# Patient Record
Sex: Female | Born: 1950 | Race: White | Hispanic: No | Marital: Married | Smoking: Never smoker
Health system: Southern US, Community
[De-identification: ages and names within clinical notes are randomized; demographics above are authoritative.]

## PROBLEM LIST (undated history)

## (undated) ENCOUNTER — Ambulatory Visit: Discharge status: Absent without leave | Payer: Medicare HMO

## (undated) DIAGNOSIS — J189 Pneumonia, unspecified organism: Secondary | ICD-10-CM

## (undated) DIAGNOSIS — I499 Cardiac arrhythmia, unspecified: Secondary | ICD-10-CM

## (undated) DIAGNOSIS — E785 Hyperlipidemia, unspecified: Secondary | ICD-10-CM

## (undated) DIAGNOSIS — K219 Gastro-esophageal reflux disease without esophagitis: Secondary | ICD-10-CM

## (undated) DIAGNOSIS — F329 Major depressive disorder, single episode, unspecified: Secondary | ICD-10-CM

## (undated) DIAGNOSIS — I10 Essential (primary) hypertension: Secondary | ICD-10-CM

## (undated) DIAGNOSIS — J45909 Unspecified asthma, uncomplicated: Secondary | ICD-10-CM

## (undated) DIAGNOSIS — IMO0002 Reserved for concepts with insufficient information to code with codable children: Secondary | ICD-10-CM

## (undated) DIAGNOSIS — I4891 Unspecified atrial fibrillation: Secondary | ICD-10-CM

## (undated) DIAGNOSIS — R0602 Shortness of breath: Secondary | ICD-10-CM

## (undated) DIAGNOSIS — G4730 Sleep apnea, unspecified: Secondary | ICD-10-CM

## (undated) DIAGNOSIS — J449 Chronic obstructive pulmonary disease, unspecified: Secondary | ICD-10-CM

## (undated) DIAGNOSIS — M1990 Unspecified osteoarthritis, unspecified site: Secondary | ICD-10-CM

## (undated) DIAGNOSIS — F32A Depression, unspecified: Secondary | ICD-10-CM

## (undated) HISTORY — DX: Hyperlipidemia, unspecified: E78.5

## (undated) HISTORY — PX: ECTOPIC PREGNANCY SURGERY: SHX613

## (undated) HISTORY — PX: APPENDECTOMY: SHX54

## (undated) HISTORY — PX: HERNIA REPAIR: SHX51

## (undated) HISTORY — DX: Unspecified osteoarthritis, unspecified site: M19.90

## (undated) HISTORY — DX: Unspecified atrial fibrillation: I48.91

## (undated) HISTORY — DX: Reserved for concepts with insufficient information to code with codable children: IMO0002

## (undated) HISTORY — DX: Essential (primary) hypertension: I10

---

## 2007-03-29 ENCOUNTER — Other Ambulatory Visit: Admission: RE | Admit: 2007-03-29 | Discharge: 2007-03-29 | Payer: Self-pay | Admitting: Internal Medicine

## 2007-06-13 ENCOUNTER — Ambulatory Visit (HOSPITAL_COMMUNITY): Admission: RE | Admit: 2007-06-13 | Discharge: 2007-06-13 | Payer: Self-pay | Admitting: Internal Medicine

## 2007-06-15 ENCOUNTER — Ambulatory Visit (HOSPITAL_COMMUNITY): Admission: RE | Admit: 2007-06-15 | Discharge: 2007-06-15 | Payer: Self-pay | Admitting: Internal Medicine

## 2007-07-01 ENCOUNTER — Emergency Department (HOSPITAL_COMMUNITY): Admission: EM | Admit: 2007-07-01 | Discharge: 2007-07-01 | Payer: Self-pay | Admitting: Emergency Medicine

## 2008-02-01 ENCOUNTER — Ambulatory Visit (HOSPITAL_COMMUNITY): Admission: RE | Admit: 2008-02-01 | Discharge: 2008-02-01 | Payer: Self-pay | Admitting: Internal Medicine

## 2008-03-14 HISTORY — PX: KNEE ARTHROSCOPY: SUR90

## 2008-09-03 DIAGNOSIS — E785 Hyperlipidemia, unspecified: Secondary | ICD-10-CM | POA: Insufficient documentation

## 2008-09-03 DIAGNOSIS — M9979 Connective tissue and disc stenosis of intervertebral foramina of abdomen and other regions: Secondary | ICD-10-CM | POA: Insufficient documentation

## 2008-09-03 DIAGNOSIS — IMO0002 Reserved for concepts with insufficient information to code with codable children: Secondary | ICD-10-CM | POA: Insufficient documentation

## 2008-09-03 DIAGNOSIS — I10 Essential (primary) hypertension: Secondary | ICD-10-CM | POA: Insufficient documentation

## 2008-10-31 ENCOUNTER — Ambulatory Visit (HOSPITAL_COMMUNITY): Admission: RE | Admit: 2008-10-31 | Discharge: 2008-10-31 | Payer: Self-pay | Admitting: Internal Medicine

## 2010-03-14 HISTORY — PX: KNEE ARTHROSCOPY: SUR90

## 2010-08-23 ENCOUNTER — Ambulatory Visit (HOSPITAL_BASED_OUTPATIENT_CLINIC_OR_DEPARTMENT_OTHER)
Admission: RE | Admit: 2010-08-23 | Discharge: 2010-08-23 | Disposition: A | Discharge status: Home / Self Care | Payer: Worker's Compensation | Source: Ambulatory Visit | Attending: Orthopedic Surgery | Admitting: Orthopedic Surgery

## 2010-08-23 DIAGNOSIS — M1710 Unilateral primary osteoarthritis, unspecified knee: Secondary | ICD-10-CM | POA: Insufficient documentation

## 2010-08-23 DIAGNOSIS — Z01810 Encounter for preprocedural cardiovascular examination: Secondary | ICD-10-CM | POA: Insufficient documentation

## 2010-08-23 DIAGNOSIS — M23302 Other meniscus derangements, unspecified lateral meniscus, unspecified knee: Secondary | ICD-10-CM | POA: Insufficient documentation

## 2010-08-23 DIAGNOSIS — M23329 Other meniscus derangements, posterior horn of medial meniscus, unspecified knee: Secondary | ICD-10-CM | POA: Insufficient documentation

## 2010-08-23 DIAGNOSIS — Z01812 Encounter for preprocedural laboratory examination: Secondary | ICD-10-CM | POA: Insufficient documentation

## 2010-08-23 LAB — POCT I-STAT 4, (NA,K, GLUC, HGB,HCT)
Glucose, Bld: 87 mg/dL (ref 70–99)
HCT: 44 % (ref 36.0–46.0)
Hemoglobin: 15 g/dL (ref 12.0–15.0)
Potassium: 4 mEq/L (ref 3.5–5.1)
Sodium: 141 mEq/L (ref 135–145)

## 2010-08-25 NOTE — Op Note (Signed)
  Kathryn Olsen, Kathryn Olsen       ACCOUNT NO.:  [id number]  MEDICAL RECORD NO.:  [id number]  LOCATION:                                 FACILITY:  PHYSICIAN:  Marlowe Kays, M.D.       DATE OF BIRTH:  DATE OF PROCEDURE:  08/23/2010 DATE OF DISCHARGE:                              OPERATIVE REPORT   PREOPERATIVE DIAGNOSES: 1. Torn medial meniscus. 2. Osteoarthritis of left knee.  POSTOPERATIVE DIAGNOSES: 1. Torn medial and lateral menisci. 2. Osteoarthritis of left knee.  OPERATION:  Left knee arthroscopy with: 1. Partial medial lateral meniscectomy. 2. Shaving of medial and lateral femoral condyle.  SURGEON:  Marlowe Kays, M.D.  ASSISTANT:  Nurse.  ANESTHESIA:  General.  INDICATION FOR PROCEDURE:  She has a history of falling at work with an injury to her left knee.  Plain x-rays have demonstrated tricompartmental degenerative arthritis and an MRI on June 15, 2010, has demonstrated a large tear of the posterior horn and body of the medial meniscus with what was thought to be mucoid degenerative changes of the lateral meniscus, but no definite tear.  ACL was felt to be intact.  PROCEDURE IN DETAIL:  Satisfactory general anesthesia, pneumatic tourniquet applied to left lower extremity.  The leg Esmarched out nonsterilely and tourniquet inflated to 350 mmHg.  Thigh stabilizer applied.  Left leg was prepped with DuraPrep, stabilizer and ankle draped in sterile field.  Time-out performed.  Superior medial saline inflow.  First, an anterolateral portal on medial compartment knee joint was evaluated, medially, there was severe medial compartment arthritis. There was marked detachment of articular cartilage throughout the medial femoral condyle and a macerated type tearing of almost the entire medial meniscus.  In addition, the posterior half of the medial tibial plateau had full thickness loss.  The ACL appeared to be somewhat intact, but partially absent.  I shaved  down the medial femoral condyle with 3.5 shaver and trimmed the medial meniscus back to stable rim with the combination of the shaver and baskets.  Final pictures were taken.  In reversed portals, her lateral meniscus also was torn throughout its extent.  Pictures were taken.  I shaved it down until smooth with a 3.5 shaver.  I also smoothed down the lateral femoral condyle with the shaver as well.  Looking at the lateral gutter and suprapatellar area, she had diffuse wear of the patellofemoral surfaces as depicted on both the plain films and MRI.  The joint was then irrigated to clear and all fluid possible was removed and closed the two anterior portals with 4-0 nylon and then injected through the inflow apparatus 20 mL of 0.5% Marcaine adrenaline and 4 mg of morphine and I then closed this portal with 4-0 nylon as well.  Betadine adaptive dry sterile dressing were applied.  Tourniquet was released.  She tolerated the procedure well and was taken to recovery room in satisfactory condition with no known complications.         ______________________________ Marlowe Kays, M.D.     JA/MEDQ  D:  08/23/2010  T:  08/23/2010  Job:  [id number]  Electronically Signed by Marlowe Kays M.D. on 08/25/2010 12:56:49 PM

## 2010-09-29 ENCOUNTER — Ambulatory Visit: Payer: Worker's Compensation | Attending: Orthopedic Surgery | Admitting: Physical Therapy

## 2010-09-29 DIAGNOSIS — M25669 Stiffness of unspecified knee, not elsewhere classified: Secondary | ICD-10-CM | POA: Insufficient documentation

## 2010-09-29 DIAGNOSIS — IMO0001 Reserved for inherently not codable concepts without codable children: Secondary | ICD-10-CM | POA: Insufficient documentation

## 2010-09-29 DIAGNOSIS — R262 Difficulty in walking, not elsewhere classified: Secondary | ICD-10-CM | POA: Insufficient documentation

## 2010-09-29 DIAGNOSIS — M25569 Pain in unspecified knee: Secondary | ICD-10-CM | POA: Insufficient documentation

## 2010-10-04 ENCOUNTER — Ambulatory Visit: Payer: Worker's Compensation | Attending: Orthopedic Surgery | Admitting: Physical Therapy

## 2010-10-04 DIAGNOSIS — M25569 Pain in unspecified knee: Secondary | ICD-10-CM | POA: Insufficient documentation

## 2010-10-04 DIAGNOSIS — IMO0001 Reserved for inherently not codable concepts without codable children: Secondary | ICD-10-CM | POA: Insufficient documentation

## 2010-10-04 DIAGNOSIS — M25669 Stiffness of unspecified knee, not elsewhere classified: Secondary | ICD-10-CM | POA: Insufficient documentation

## 2010-10-04 DIAGNOSIS — R262 Difficulty in walking, not elsewhere classified: Secondary | ICD-10-CM | POA: Insufficient documentation

## 2010-10-06 ENCOUNTER — Ambulatory Visit: Payer: Worker's Compensation | Admitting: Physical Therapy

## 2010-10-11 ENCOUNTER — Ambulatory Visit: Payer: Worker's Compensation | Admitting: Physical Therapy

## 2010-10-13 ENCOUNTER — Ambulatory Visit: Payer: Worker's Compensation | Attending: Orthopedic Surgery | Admitting: Physical Therapy

## 2010-10-13 DIAGNOSIS — R262 Difficulty in walking, not elsewhere classified: Secondary | ICD-10-CM | POA: Insufficient documentation

## 2010-10-13 DIAGNOSIS — M25669 Stiffness of unspecified knee, not elsewhere classified: Secondary | ICD-10-CM | POA: Insufficient documentation

## 2010-10-13 DIAGNOSIS — IMO0001 Reserved for inherently not codable concepts without codable children: Secondary | ICD-10-CM | POA: Insufficient documentation

## 2010-10-13 DIAGNOSIS — M25569 Pain in unspecified knee: Secondary | ICD-10-CM | POA: Insufficient documentation

## 2010-10-19 ENCOUNTER — Ambulatory Visit: Payer: Worker's Compensation | Admitting: Physical Therapy

## 2010-10-21 ENCOUNTER — Ambulatory Visit: Payer: Worker's Compensation | Admitting: Physical Therapy

## 2010-10-25 ENCOUNTER — Ambulatory Visit: Payer: Worker's Compensation | Admitting: Physical Therapy

## 2010-10-28 ENCOUNTER — Ambulatory Visit: Payer: Worker's Compensation | Admitting: Physical Therapy

## 2010-11-02 ENCOUNTER — Ambulatory Visit: Payer: Worker's Compensation | Admitting: Physical Therapy

## 2010-11-04 ENCOUNTER — Ambulatory Visit: Payer: Worker's Compensation | Admitting: Physical Therapy

## 2010-11-09 ENCOUNTER — Ambulatory Visit: Payer: Worker's Compensation | Admitting: Physical Therapy

## 2011-04-12 ENCOUNTER — Other Ambulatory Visit (HOSPITAL_COMMUNITY): Payer: Self-pay | Admitting: Internal Medicine

## 2011-04-12 ENCOUNTER — Ambulatory Visit (HOSPITAL_COMMUNITY)
Admission: RE | Admit: 2011-04-12 | Discharge: 2011-04-12 | Disposition: A | Discharge status: Home / Self Care | Payer: Worker's Compensation | Source: Ambulatory Visit | Attending: Internal Medicine | Admitting: Internal Medicine

## 2011-04-12 DIAGNOSIS — R05 Cough: Secondary | ICD-10-CM

## 2011-04-12 DIAGNOSIS — K449 Diaphragmatic hernia without obstruction or gangrene: Secondary | ICD-10-CM | POA: Insufficient documentation

## 2011-04-12 DIAGNOSIS — R059 Cough, unspecified: Secondary | ICD-10-CM

## 2011-04-13 ENCOUNTER — Encounter: Payer: Self-pay | Admitting: Gastroenterology

## 2011-04-26 ENCOUNTER — Ambulatory Visit (INDEPENDENT_AMBULATORY_CARE_PROVIDER_SITE_OTHER): Payer: Worker's Compensation | Admitting: Gastroenterology

## 2011-04-26 ENCOUNTER — Encounter: Payer: Self-pay | Admitting: Gastroenterology

## 2011-04-26 DIAGNOSIS — K219 Gastro-esophageal reflux disease without esophagitis: Secondary | ICD-10-CM

## 2011-04-26 DIAGNOSIS — Z1211 Encounter for screening for malignant neoplasm of colon: Secondary | ICD-10-CM

## 2011-04-26 DIAGNOSIS — R079 Chest pain, unspecified: Secondary | ICD-10-CM

## 2011-04-26 DIAGNOSIS — R933 Abnormal findings on diagnostic imaging of other parts of digestive tract: Secondary | ICD-10-CM | POA: Insufficient documentation

## 2011-04-26 MED ORDER — OMEPRAZOLE-SODIUM BICARBONATE 40-1100 MG PO CAPS: 1.0000 | ORAL_CAPSULE | Freq: Every day | ORAL | Status: DC

## 2011-04-26 NOTE — Assessment & Plan Note (Signed)
Plan screening colonoscopy 

## 2011-04-26 NOTE — Assessment & Plan Note (Signed)
She likely has musculoskeletal pain in the left anterior chest.  Medications #1 local heat and stretching

## 2011-04-26 NOTE — Assessment & Plan Note (Signed)
She has a large hiatal hernia. One cannot determine whether this is a sliding or paraesophageal hernia.  Recommendations #1 upper endoscopy

## 2011-04-26 NOTE — Progress Notes (Signed)
History of Present Illness: Kathryn Olsen is a pleasant 61 year old white female referred at the request of Lucky Cowboy, MD for evaluation of chest discomfort. She has been complaining of postprandial lower chest and upper abdominal pain. She has occasional pyrosis. For the past 2 months she has had a chronic cough and intermittent hoarseness. She underwent a chest x-ray, which I reviewed, that demonstrated a large hiatal hernia. She denies dysphagia. She has taken PPI  medicines intermittently. She also complains of crampy type pain in her left lower chest that is relieved by stretching. It tends to occur when she bends over and occasionally when she lies in bed.     Past Medical History  Diagnosis Date  . Degeneration of intervertebral disc, site unspecified   . Dyslipidemia   . Unspecified essential hypertension    Past Surgical History  Procedure Date  . Appendectomy   . Ectopic pregnancy surgery    family history includes Alcohol abuse in her brother; COPD in her mother; Hypertension in her mother; Lung cancer in her father; and Melanoma in her brother. Current Outpatient Prescriptions  Medication Sig Dispense Refill  . cholecalciferol (VITAMIN D) 1000 UNITS tablet Take 1,000 Units by mouth daily.      . citalopram (CELEXA) 20 MG tablet Take 20 mg by mouth daily.      . diphenhydramine-acetaminophen (TYLENOL PM) 25-500 MG TABS Take 1 tablet by mouth at bedtime as needed.      [name] Kitchen lisinopril-hydrochlorothiazide (PRINZIDE,ZESTORETIC) 20-25 MG per tablet Take 1 tablet by mouth daily.      . pravastatin (PRAVACHOL) 40 MG tablet Take 40 mg by mouth daily.       Allergies as of 04/26/2011  . (No Known Allergies)    reports that she has never smoked. She has never used smokeless tobacco. She reports that she drinks alcohol. She reports that she does not use illicit drugs.     Review of Systems: Pertinent positive and negative review of systems were noted in the above HPI  section. All other review of systems were otherwise negative.  Vital signs were reviewed in today's medical record Physical Exam: General: Well developed , well nourished, no acute distress Head: Normocephalic and atraumatic Eyes:  sclerae anicteric, EOMI Ears: Normal auditory acuity Mouth: No deformity or lesions Neck: Supple, no masses or thyromegaly Lungs: Clear throughout to auscultation Heart: Regular rate and rhythm; no murmurs, rubs or bruits Abdomen: Soft, non tender and non distended. No masses, hepatosplenomegaly or hernias noted. Normal Bowel sounds Rectal:deferred Musculoskeletal: Symmetrical with no gross deformities; there is mild tenderness to palpation over the left ninth and 10th ribs anteriorly  Skin: No lesions on visible extremities Pulses:  Normal pulses noted Extremities: No clubbing, cyanosis, edema or deformities noted Neurological: Alert oriented x 4, grossly nonfocal Cervical Nodes:  No significant cervical adenopathy Inguinal Nodes: No significant inguinal adenopathy Psychological:  Alert and cooperative. Normal mood and affect

## 2011-04-26 NOTE — Patient Instructions (Signed)
Upper GI Endoscopy Upper GI endoscopy means using a flexible scope to look at the esophagus, stomach, and upper small bowel. This is done to make a diagnosis in people with heartburn, abdominal pain, or abnormal bleeding. Sometimes an endoscope is needed to remove foreign bodies or food that become stuck in the esophagus; it can also be used to take biopsy samples. For the best results, do not eat or drink for 8 hours before having your upper endoscopy.  To perform the endoscopy, you will probably be sedated and your throat will be numbed with a special spray. The endoscope is then slowly passed down your throat (this will not interfere with your breathing). An endoscopy exam takes 15 to 30 minutes to complete and there is no real pain. Patients rarely remember much about the procedure. The results of the test may take several days if a biopsy or other test is taken.  You may have a sore throat after an endoscopy exam. Serious complications are very rare. Stick to liquids and soft foods until your pain is better. Do not drive a car or operate any dangerous equipment for at least 24 hours after being sedated. SEEK IMMEDIATE MEDICAL CARE IF:   You have severe throat pain.   You have shortness of breath.   You have bleeding problems.   You have a fever.   You have difficulty recovering from your sedation.  Document Released: 04/07/2004 Document Revised: 11/10/2010 Document Reviewed: 03/02/2008 [facility] Patient Information 2012 [place], [place].  Colonoscopy A colonoscopy is an exam to evaluate your entire colon. In this exam, your colon is cleansed. A long fiberoptic tube is inserted through your rectum and into your colon. The fiberoptic scope (endoscope) is a long bundle of enclosed and very flexible fibers. These fibers transmit light to the area examined and send images from that area to your caregiver. Discomfort is usually minimal. You may be given a drug to help you sleep (sedative) during or  prior to the procedure. This exam helps to detect lumps (tumors), polyps, inflammation, and areas of bleeding. Your caregiver may also take a small piece of tissue (biopsy) that will be examined under a microscope. LET YOUR CAREGIVER KNOW ABOUT:   Allergies to food or medicine.   Medicines taken, including vitamins, herbs, eyedrops, over-the-counter medicines, and creams.   Use of steroids (by mouth or creams).   Previous problems with anesthetics or numbing medicines.   History of bleeding problems or blood clots.   Previous surgery.   Other health problems, including diabetes and kidney problems.   Possibility of pregnancy, if this applies.  BEFORE THE PROCEDURE   A clear liquid diet may be required for 2 days before the exam.   Ask your caregiver about changing or stopping your regular medications.   Liquid injections (enemas) or laxatives may be required.   A large amount of electrolyte solution may be given to you to drink over a short period of time. This solution is used to clean out your colon.   You should be present 60 minutes prior to your procedure or as directed by your caregiver.  AFTER THE PROCEDURE   If you received a sedative or pain relieving medication, you will need to arrange for someone to drive you home.   Occasionally, there is a little blood passed with the first bowel movement. Do not be concerned.  FINDING OUT THE RESULTS OF YOUR TEST Not all test results are available during your visit. If your test results  are not back during the visit, make an appointment with your caregiver to find out the results. Do not assume everything is normal if you have not heard from your caregiver or the medical facility. It is important for you to follow up on all of your test results. HOME CARE INSTRUCTIONS   It is not unusual to pass moderate amounts of gas and experience mild abdominal cramping following the procedure. This is due to air being used to inflate your  colon during the exam. Walking or a warm pack on your belly (abdomen) may help.   You may resume all normal meals and activities after sedatives and medicines have worn off.   Only take over-the-counter or prescription medicines for pain, discomfort, or fever as directed by your caregiver. Do not use aspirin or blood thinners if a biopsy was taken. Consult your caregiver for medicine usage if biopsies were taken.  SEEK IMMEDIATE MEDICAL CARE IF:   You have a fever.   You pass large blood clots or fill a toilet with blood following the procedure. This may also occur 10 to 14 days following the procedure. This is more likely if a biopsy was taken.   You develop abdominal pain that keeps getting worse and cannot be relieved with medicine.  Document Released: 02/26/2000 Document Revised: 11/10/2010 Document Reviewed: 10/11/2007 [facility] Patient Information 2012 [place], [place]. We gave you a SuPrep sample kit today

## 2011-04-26 NOTE — Assessment & Plan Note (Signed)
Esophageal reflux likely accounts for her coughing and intermittent pyrosis.  Medications #1 resume Zegerid 40 mg each bedtime

## 2011-04-27 ENCOUNTER — Encounter: Payer: Self-pay | Admitting: Gastroenterology

## 2011-04-29 ENCOUNTER — Encounter: Payer: Self-pay | Admitting: Gastroenterology

## 2011-04-29 ENCOUNTER — Ambulatory Visit (AMBULATORY_SURGERY_CENTER): Payer: BC Managed Care – PPO | Admitting: Gastroenterology

## 2011-04-29 VITALS — BP 147/96 | HR 62 | Temp 97.6°F | Resp 18 | Ht 66.0 in | Wt 230.0 lb

## 2011-04-29 DIAGNOSIS — K449 Diaphragmatic hernia without obstruction or gangrene: Secondary | ICD-10-CM

## 2011-04-29 MED ORDER — SODIUM CHLORIDE 0.9 % IV SOLN: 500.0000 mL | INTRAVENOUS | Status: DC

## 2011-04-29 NOTE — Op Note (Signed)
Kathryn Olsen [address] [place], [place]  [address]  ENDOSCOPY PROCEDURE REPORT  PATIENT:  Kathryn Olsen, Kathryn Olsen  MR#:  [id number] BIRTHDATE:  1951/03/12, 60 yrs. old  GENDER:  female  ENDOSCOPIST:  Barbette Hair. Arlyce Dice, MD Referred by:  Lucky Cowboy, M.D.  PROCEDURE DATE:  04/29/2011 PROCEDURE:  EGD, diagnostic 43235 ASA CLASS:  Class II INDICATIONS:  abnormal imaging  MEDICATIONS:   MAC sedation, administered by CRNA propofol 150mg IV, glycopyrrolate (Robinal) 0.2 mg IV, 0.6cc simethancone 0.6 cc PO TOPICAL ANESTHETIC:  DESCRIPTION OF PROCEDURE:   After the risks and benefits of the procedure were explained, informed consent was obtained.  The LB GIF-H180 [id number] endoscope was introduced through the mouth and advanced to the third portion of the duodenum.  The instrument was slowly withdrawn as the mucosa was fully examined. <<PROCEDUREIMAGES>>  A hiatal hernia was found at the gastroesophageal junction. 9cm sliding hiatal hernia  Otherwise the examination was normal (see image2, image3, and image1).    Retroflexed views revealed no abnormalities.    The scope was then withdrawn from the patient and the procedure completed.  COMPLICATIONS:  None  ENDOSCOPIC IMPRESSION: 1) Hiatal hernia at the gastroesophageal junction 2) Otherwise normal examination RECOMMENDATIONS: 1) continue PPI - zegerid 2) Call office next 2-3 days to schedule an office appointment for 3-4 weeks  ______________________________ Barbette Hair. Arlyce Dice, MD  CC:  n. eSIGNED:   Barbette Hair. Zymarion Favorite at 04/29/2011 09:20 AM  Russell-strader, Lupita Leash, [phone]

## 2011-04-29 NOTE — Progress Notes (Signed)
Patient did not experience any of the following events: a burn prior to discharge; a fall within the facility; wrong site/side/patient/procedure/implant event; or a hospital transfer or hospital admission upon discharge from the facility. (G8907) Patient did not have preoperative order for IV antibiotic SSI prophylaxis. (G8918)  

## 2011-04-29 NOTE — Patient Instructions (Signed)
FOLLOW THE INSTRUCTIONS ON THE GREEN AND BLUE INSTRUCTION SHEETS.  CONTINUE YOUR MEDICATIONS. CONTINUE YOUR ZEGRID.  CALL DR. KAPLAN'S OFFICE IN 2-3 DAYS TO SCHEDULE A FOLLOW UP APPOINTMENT WITH DR. KAPLAN FOR 3-4 WEEKS. [phone].

## 2011-05-02 ENCOUNTER — Telehealth: Payer: Self-pay | Admitting: *Deleted

## 2011-05-02 NOTE — Telephone Encounter (Signed)
  Follow up Call-  Call back number 04/29/2011  Post procedure Call Back phone  # [phone]  Permission to leave phone message Yes     Patient questions:  Do you have a fever, pain , or abdominal swelling? no Pain Score  0 *  Have you tolerated food without any problems? yes  Have you been able to return to your normal activities? yes  Do you have any questions about your discharge instructions: Diet   no Medications  no Follow up visit  no  Do you have questions or concerns about your Care? no  Actions: * If pain score is 4 or above: No action needed, pain <4.

## 2011-05-19 ENCOUNTER — Telehealth: Payer: Self-pay | Admitting: Gastroenterology

## 2011-05-19 NOTE — Telephone Encounter (Signed)
no

## 2011-05-20 ENCOUNTER — Encounter: Payer: BC Managed Care – PPO | Admitting: Gastroenterology

## 2011-05-26 ENCOUNTER — Ambulatory Visit: Payer: BC Managed Care – PPO | Admitting: Gastroenterology

## 2011-08-16 ENCOUNTER — Encounter (HOSPITAL_COMMUNITY): Payer: Self-pay | Admitting: *Deleted

## 2011-08-16 ENCOUNTER — Emergency Department (HOSPITAL_COMMUNITY): Payer: BC Managed Care – PPO

## 2011-08-16 ENCOUNTER — Emergency Department (HOSPITAL_COMMUNITY)
Admission: EM | Admit: 2011-08-16 | Discharge: 2011-08-17 | Disposition: A | Discharge status: Home / Self Care | Payer: BC Managed Care – PPO | Attending: Emergency Medicine | Admitting: Emergency Medicine

## 2011-08-16 DIAGNOSIS — R51 Headache: Secondary | ICD-10-CM | POA: Insufficient documentation

## 2011-08-16 DIAGNOSIS — R110 Nausea: Secondary | ICD-10-CM | POA: Insufficient documentation

## 2011-08-16 DIAGNOSIS — I10 Essential (primary) hypertension: Secondary | ICD-10-CM | POA: Insufficient documentation

## 2011-08-16 DIAGNOSIS — R42 Dizziness and giddiness: Secondary | ICD-10-CM | POA: Insufficient documentation

## 2011-08-16 LAB — CBC
HCT: 40.5 % (ref 36.0–46.0)
Hemoglobin: 12.6 g/dL (ref 12.0–15.0)
MCH: 25.1 pg — ABNORMAL LOW (ref 26.0–34.0)
MCHC: 31.1 g/dL (ref 30.0–36.0)
MCV: 80.8 fL (ref 78.0–100.0)
Platelets: 319 10*3/uL (ref 150–400)
RBC: 5.01 MIL/uL (ref 3.87–5.11)
RDW: 15.4 % (ref 11.5–15.5)
WBC: 9.5 10*3/uL (ref 4.0–10.5)

## 2011-08-16 LAB — POCT I-STAT, CHEM 8
BUN: 22 mg/dL (ref 6–23)
Calcium, Ion: 1.17 mmol/L (ref 1.12–1.32)
Chloride: 108 mEq/L (ref 96–112)
Creatinine, Ser: 0.7 mg/dL (ref 0.50–1.10)
Glucose, Bld: 102 mg/dL — ABNORMAL HIGH (ref 70–99)
HCT: 41 % (ref 36.0–46.0)
Hemoglobin: 13.9 g/dL (ref 12.0–15.0)
Potassium: 4.1 mEq/L (ref 3.5–5.1)
Sodium: 144 mEq/L (ref 135–145)
TCO2: 26 mmol/L (ref 0–100)

## 2011-08-16 LAB — DIFFERENTIAL
Basophils Absolute: 0 10*3/uL (ref 0.0–0.1)
Basophils Relative: 0 % (ref 0–1)
Eosinophils Absolute: 0.4 10*3/uL (ref 0.0–0.7)
Eosinophils Relative: 4 % (ref 0–5)
Lymphocytes Relative: 29 % (ref 12–46)
Lymphs Abs: 2.8 10*3/uL (ref 0.7–4.0)
Monocytes Absolute: 0.8 10*3/uL (ref 0.1–1.0)
Monocytes Relative: 8 % (ref 3–12)
Neutro Abs: 5.5 10*3/uL (ref 1.7–7.7)
Neutrophils Relative %: 58 % (ref 43–77)

## 2011-08-16 LAB — URINE MICROSCOPIC-ADD ON

## 2011-08-16 LAB — URINALYSIS, ROUTINE W REFLEX MICROSCOPIC
Bilirubin Urine: NEGATIVE
Glucose, UA: NEGATIVE mg/dL
Hgb urine dipstick: NEGATIVE
Ketones, ur: NEGATIVE mg/dL
Nitrite: NEGATIVE
Protein, ur: NEGATIVE mg/dL
Specific Gravity, Urine: 1.022 (ref 1.005–1.030)
Urobilinogen, UA: 1 mg/dL (ref 0.0–1.0)
pH: 7.5 (ref 5.0–8.0)

## 2011-08-16 MED ORDER — SODIUM CHLORIDE 0.9 % IV SOLN
Freq: Once | INTRAVENOUS | Status: AC
  Administered 2011-08-16: 22:00:00 via INTRAVENOUS

## 2011-08-16 MED ORDER — ONDANSETRON HCL 4 MG/2ML IJ SOLN
4.0000 mg | Freq: Once | INTRAMUSCULAR | Status: AC
  Administered 2011-08-16: 4 mg via INTRAVENOUS
  Filled 2011-08-16: qty 2

## 2011-08-16 MED ORDER — MORPHINE SULFATE 4 MG/ML IJ SOLN
4.0000 mg | Freq: Once | INTRAMUSCULAR | Status: AC
  Administered 2011-08-16: 4 mg via INTRAVENOUS
  Filled 2011-08-16: qty 1

## 2011-08-16 NOTE — ED Provider Notes (Signed)
History     CSN: [id number]  Arrival date & time 08/16/11  2104   First MD Initiated Contact with Patient 08/16/11 2136      Chief Complaint  Patient presents with  . Headache  . Dizziness  . Hypertension    (Consider location/radiation/quality/duration/timing/severity/associated sxs/prior treatment) HPI Comments: This is a 61-year-old lady, here.  All day.  Has felt unwell with a frontal headache, dizziness, slight nausea.  Denies visual disturbances, 1 neuro defects.  States she has been taking her medication on a regular basis, and has not missed any doses.  She went to [employer] to have her blood pressure checked, and it was very elevated, and came to the emergency department.  She denies sinus pressure rhinitis, recent URI symptoms, seasonal allergies  Patient is a 61 y.o. female presenting with headaches and hypertension. The history is provided by the patient.  Headache  This is a new problem. The current episode started 12 to 24 hours ago. The problem occurs constantly. The problem has been gradually worsening. The headache is associated with nothing. The pain is located in the frontal region. The quality of the pain is described as dull. The pain is at a severity of 5/10. The pain is moderate. The pain does not radiate. Associated symptoms include nausea. Pertinent negatives include no anorexia, no fever, no malaise/fatigue, no chest pressure, no near-syncope, no orthopnea, no palpitations, no syncope, no shortness of breath and no vomiting. She has tried nothing for the symptoms.  Hypertension Associated symptoms include headaches and nausea. Pertinent negatives include no abdominal pain, anorexia, chills, fever, neck pain, numbness, rash, vomiting or weakness.    Past Medical History  Diagnosis Date  . Degeneration of intervertebral disc, site unspecified   . Dyslipidemia   . Unspecified essential hypertension     Past Surgical History  Procedure Date  . Appendectomy   .  Ectopic pregnancy surgery     Family History  Problem Relation Age of Onset  . Lung cancer Father   . COPD Mother   . Hypertension Mother   . Alcohol abuse Brother   . Melanoma Brother     History  Substance Use Topics  . Smoking status: Never Smoker   . Smokeless tobacco: Never Used  . Alcohol Use: 0.6 oz/week    1 Glasses of wine per week     occasional glass of wine    OB History    Grav Para Term Preterm Abortions TAB SAB Ect Mult Living                  Review of Systems  Constitutional: Negative for fever, chills and malaise/fatigue.  HENT: Negative for rhinorrhea, neck pain and ear discharge.   Eyes: Negative for visual disturbance.  Respiratory: Negative for shortness of breath.   Cardiovascular: Negative for palpitations, orthopnea, leg swelling, syncope and near-syncope.  Gastrointestinal: Positive for nausea. Negative for vomiting, abdominal pain and anorexia.  Genitourinary: Negative for dysuria and frequency.  Musculoskeletal: Negative for back pain.  Skin: Negative for pallor and rash.  Neurological: Positive for dizziness and headaches. Negative for seizures, syncope, speech difficulty, weakness and numbness.    Allergies  Review of patient's allergies indicates no known allergies.  Home Medications   Current Outpatient Rx  Name Route Sig Dispense Refill  . VITAMIN D 1000 UNITS PO TABS Oral Take 1,000 Units by mouth daily.    [name] Kitchen CITALOPRAM HYDROBROMIDE 20 MG PO TABS Oral Take 20 mg by mouth daily.    [name] Kitchen  LISINOPRIL-HYDROCHLOROTHIAZIDE 20-25 MG PO TABS Oral Take 1 tablet by mouth daily.    . MELOXICAM 15 MG PO TABS Oral Take 15 mg by mouth daily.    [name] Kitchen OMEPRAZOLE-SODIUM BICARBONATE 20-1100 MG PO CAPS Oral Take 1 capsule by mouth daily before breakfast.    . OMEPRAZOLE-SODIUM BICARBONATE 40-1100 MG PO CAPS Oral Take 1 capsule by mouth at bedtime. 30 capsule 2    BP 160/79  Pulse 64  Temp(Src) 98.7 F (37.1 C) (Oral)  Resp 16  SpO2 97%  Physical  Exam  Constitutional: She is oriented to person, place, and time. She appears well-developed.  HENT:  Head: Normocephalic and atraumatic.  Right Ear: External ear normal.  Left Ear: External ear normal.  Mouth/Throat: Oropharynx is clear and moist.  Eyes: Pupils are equal, round, and reactive to light.       Bilateral eye pain, with right gaze, as well as downward gaze.  No nystagmus noted  Cardiovascular: Normal rate and regular rhythm.   Pulmonary/Chest: Effort normal. No respiratory distress. She has no wheezes.  Abdominal: Soft. She exhibits no distension. There is no tenderness.  Musculoskeletal: Normal range of motion.  Neurological: She is alert and oriented to person, place, and time. She has normal strength. She displays no tremor. A cranial nerve deficit is present. Coordination abnormal.  Skin: Skin is warm. No rash noted. No pallor.    ED Course  Procedures (including critical care time)  Labs Reviewed  CBC - Abnormal; Notable for the following:    MCH 25.1 (*)    All other components within normal limits  URINALYSIS, ROUTINE W REFLEX MICROSCOPIC - Abnormal; Notable for the following:    APPearance CLOUDY (*)    Leukocytes, UA TRACE (*)    All other components within normal limits  POCT I-STAT, CHEM 8 - Abnormal; Notable for the following:    Glucose, Bld 102 (*)    All other components within normal limits  DIFFERENTIAL  URINE MICROSCOPIC-ADD ON   Ct Head Wo Contrast  08/16/2011  *RADIOLOGY REPORT*  Clinical Data: Hypertension.  History of headaches.  CT HEAD WITHOUT CONTRAST  Technique:  Contiguous axial images were obtained from the base of the skull through the vertex without contrast.  Comparison: No priors.  Findings: No acute intracranial abnormalities.  Specifically, no signs of acute intracranial hemorrhage, no evidence of acute/subacute cerebral ischemia, no focal mass, mass effect, hydrocephalus or abnormal intra or extra-axial fluid collections. No acute  displaced skull fractures are identified.  Visualized paranasal sinuses and mastoids are well pneumatized.  IMPRESSION: 1.  No acute intracranial abnormalities. 2.  The appearance the brain is normal.  Original Report Authenticated By: Florencia Reasons, M.D.     1. Headache   2. Hypertension    ED ECG REPORT   Date: 08/17/2011  EKG Time: 2:17 AM  Rate: 68  Rhythm: premature atrial contractions (PAC),   unchanged from previous tracings  Axis: normal  Intervals:none  ST&T Change: none  Narrative Interpretation: normal             MDM  Obtain labs, EKG, head CT, ask that a placement of IV KVO.  Will work to control blood pressure and reassess symptoms         Arman Filter, NP 08/17/11 [phone]

## 2011-08-16 NOTE — ED Notes (Signed)
Pt states "I just havent felt good all day." States c/o severe headache and dizziness x's a few days. Pt A&Ox's 3. No neuro deficits noted at present.

## 2011-08-17 MED ORDER — MORPHINE SULFATE 4 MG/ML IJ SOLN
4.0000 mg | Freq: Once | INTRAMUSCULAR | Status: AC
  Administered 2011-08-17: 4 mg via INTRAVENOUS
  Filled 2011-08-17: qty 1

## 2011-08-17 NOTE — Discharge Instructions (Signed)
Arterial Hypertension Arterial hypertension (high blood pressure) is a condition of elevated pressure in your blood vessels. Hypertension over a long period of time is a risk factor for strokes, heart attacks, and heart failure. It is also the leading cause of kidney (renal) failure.  CAUSES   In Adults -- Over 90% of all hypertension has no known cause. This is called essential or primary hypertension. In the other 10% of people with hypertension, the increase in blood pressure is caused by another disorder. This is called secondary hypertension. Important causes of secondary hypertension are:   Heavy alcohol use.   Obstructive sleep apnea.   Hyperaldosterosim (Conn's syndrome).   Steroid use.   Chronic kidney failure.   Hyperparathyroidism.   Medications.   Renal artery stenosis.   Pheochromocytoma.   Cushing's disease.   Coarctation of the aorta.   Scleroderma renal crisis.   Licorice (in excessive amounts).   Drugs (cocaine, methamphetamine).  Your caregiver can explain any items above that apply to you.  In Children -- Secondary hypertension is more common and should always be considered.   Pregnancy -- Few women of childbearing age have high blood pressure. However, up to 10% of them develop hypertension of pregnancy. Generally, this will not harm the woman. It Even be a sign of 3 complications of pregnancy: preeclampsia, HELLP syndrome, and eclampsia. Follow up and control with medication is necessary.  SYMPTOMS   This condition normally does not produce any noticeable symptoms. It is usually found during a routine exam.   Malignant hypertension is a late problem of high blood pressure. It Monger have the following symptoms:   Headaches.   Blurred vision.   End-organ damage (this means your kidneys, heart, lungs, and other organs are being damaged).   Stressful situations can increase the blood pressure. If a person with normal blood pressure has their blood  pressure go up while being seen by their caregiver, this is often termed "white coat hypertension." Its importance is not known. It Alkire be related with eventually developing hypertension or complications of hypertension.   Hypertension is often confused with mental tension, stress, and anxiety.  DIAGNOSIS  The diagnosis is made by 3 separate blood pressure measurements. They are taken at least 1 week apart from each other. If there is organ damage from hypertension, the diagnosis Lemire be made without repeat measurements. Hypertension is usually identified by having blood pressure readings:  Above 140/90 mmHg measured in both arms, at 3 separate times, over a couple weeks.   Over 130/80 mmHg should be considered a risk factor and Grisanti require treatment in patients with diabetes.  Blood pressure readings over 120/80 mmHg are called "pre-hypertension" even in non-diabetic patients. To get a true blood pressure measurement, use the following guidelines. Be aware of the factors that can alter blood pressure readings.  Take measurements at least 1 hour after caffeine.   Take measurements 30 minutes after smoking and without any stress. This is another reason to quit smoking - it raises your blood pressure.   Use a proper cuff size. Ask your caregiver if you are not sure about your cuff size.   Most home blood pressure cuffs are automatic. They will measure systolic and diastolic pressures. The systolic pressure is the pressure reading at the start of sounds. Diastolic pressure is the pressure at which the sounds disappear. If you are elderly, measure pressures in multiple postures. Try sitting, lying or standing.   Sit at rest for a minimum of   5 minutes before taking measurements.   You should not be on any medications like decongestants. These are found in many cold medications.   Record your blood pressure readings and review them with your caregiver.  If you have hypertension:  Your caregiver  may do tests to be sure you do not have secondary hypertension (see "causes" above).   Your caregiver may also look for signs of metabolic syndrome. This is also called Syndrome X or Insulin Resistance Syndrome. You may have this syndrome if you have type 2 diabetes, abdominal obesity, and abnormal blood lipids in addition to hypertension.   Your caregiver will take your medical and family history and perform a physical exam.   Diagnostic tests may include blood tests (for glucose, cholesterol, potassium, and kidney function), a urinalysis, or an EKG. Other tests may also be necessary depending on your condition.  PREVENTION  There are important lifestyle issues that you can adopt to reduce your chance of developing hypertension:  Maintain a normal weight.   Limit the amount of salt (sodium) in your diet.   Exercise often.   Limit alcohol intake.   Get enough potassium in your diet. Discuss specific advice with your caregiver.   Follow a DASH diet (dietary approaches to stop hypertension). This diet is rich in fruits, vegetables, and low-fat dairy products, and avoids certain fats.  PROGNOSIS  Essential hypertension cannot be cured. Lifestyle changes and medical treatment can lower blood pressure and reduce complications. The prognosis of secondary hypertension depends on the underlying cause. Many people whose hypertension is controlled with medicine or lifestyle changes can live a normal, healthy life.  RISKS AND COMPLICATIONS  While high blood pressure alone is not an illness, it often requires treatment due to its short- and long-term effects on many organs. Hypertension increases your risk for:  CVAs or strokes (cerebrovascular accident).   Heart failure due to chronically high blood pressure (hypertensive cardiomyopathy).   Heart attack (myocardial infarction).   Damage to the retina (hypertensive retinopathy).   Kidney failure (hypertensive nephropathy).  Your caregiver can  explain list items above that apply to you. Treatment of hypertension can significantly reduce the risk of complications. TREATMENT   For overweight patients, weight loss and regular exercise are recommended. Physical fitness lowers blood pressure.   Mild hypertension is usually treated with diet and exercise. A diet rich in fruits and vegetables, fat-free dairy products, and foods low in fat and salt (sodium) can help lower blood pressure. Decreasing salt intake decreases blood pressure in a 1/3 of people.   Stop smoking if you are a smoker.  The steps above are highly effective in reducing blood pressure. While these actions are easy to suggest, they are difficult to achieve. Most patients with moderate or severe hypertension end up requiring medications to bring their blood pressure down to a normal level. There are several classes of medications for treatment. Blood pressure pills (antihypertensives) will lower blood pressure by their different actions. Lowering the blood pressure by 10 mmHg may decrease the risk of complications by as much as 25%. The goal of treatment is effective blood pressure control. This will reduce your risk for complications. Your caregiver will help you determine the best treatment for you according to your lifestyle. What is excellent treatment for one person, may not be for you. HOME CARE INSTRUCTIONS   Do not smoke.   Follow the lifestyle changes outlined in the "Prevention" section.   If you are on medications, follow the directions   carefully. Blood pressure medications must be taken as prescribed. Skipping doses reduces their benefit. It also puts you at risk for problems.   Follow up with your caregiver, as directed.   If you are asked to monitor your blood pressure at home, follow the guidelines in the "Diagnosis" section above.  SEEK MEDICAL CARE IF:   You think you are having medication side effects.   You have recurrent headaches or lightheadedness.     You have swelling in your ankles.   You have trouble with your vision.  SEEK IMMEDIATE MEDICAL CARE IF:   You have sudden onset of chest pain or pressure, difficulty breathing, or other symptoms of a heart attack.   You have a severe headache.   You have symptoms of a stroke (such as sudden weakness, difficulty speaking, difficulty walking).  MAKE SURE YOU:   Understand these instructions.   Will watch your condition.   Will get help right away if you are not doing well or get worse.  Document Released: 02/28/2005 Document Revised: 02/17/2011 Document Reviewed: 09/28/2006 [facility] Patient Information 2012 [place], [place].General Headache, Without Cause A general headache has no specific cause. These headaches are not life-threatening. They will not lead to other types of headaches. HOME CARE   Make and keep follow-up visits with your doctor.   Only take medicine as told by your doctor.   Try to relax, get a massage, or use your thoughts to control your body (biofeedback).   Apply cold or heat to the head and neck. Apply 3 or 4 times a day or as needed.  Finding out the results of your test Ask when your test results will be ready. Make sure you get your test results. GET HELP RIGHT AWAY IF:   You have problems with medicine.   Your medicine does not help relieve pain.   Your headache changes or becomes worse.   You feel sick to your stomach (nauseous) or throw up (vomit).   You have a temperature by mouth above 102 F (38.9 C), not controlled by medicine.   Your have a stiff neck.   You have vision loss.   You have muscle weakness.   You lose control of your muscles.   You lose balance or have trouble walking.   You feel like you are going to pass out (faint).  MAKE SURE YOU:   Understand these instructions.   Will watch this condition.   Will get help right away if you are not doing well or get worse.  Document Released: 12/08/2007 Document Revised:  02/17/2011 Document Reviewed: 12/08/2007 [facility] Patient Information 2012 [place], [place]. Used were normal.  Your head CT was normal, your blood pressure came down as a result of relieving her headache pain.  Please make a point with Dr. Oneta Rack for further evaluation

## 2011-08-17 NOTE — ED Provider Notes (Signed)
Medical screening examination/treatment/procedure(s) were performed by non-physician practitioner and as supervising physician I was immediately available for consultation/collaboration.   Lyanne Co, MD 08/17/11 [phone]

## 2011-08-19 DIAGNOSIS — K449 Diaphragmatic hernia without obstruction or gangrene: Secondary | ICD-10-CM | POA: Insufficient documentation

## 2011-08-23 ENCOUNTER — Other Ambulatory Visit: Payer: Self-pay | Admitting: Gastroenterology

## 2011-10-13 ENCOUNTER — Encounter (HOSPITAL_COMMUNITY): Payer: Self-pay | Admitting: Pharmacy Technician

## 2011-10-17 ENCOUNTER — Other Ambulatory Visit: Payer: Self-pay | Admitting: Orthopedic Surgery

## 2011-10-18 ENCOUNTER — Inpatient Hospital Stay (HOSPITAL_COMMUNITY): Admission: RE | Admit: 2011-10-18 | Discharge: 2011-10-18 | Payer: BC Managed Care – PPO | Source: Ambulatory Visit

## 2011-10-18 ENCOUNTER — Other Ambulatory Visit: Payer: Self-pay | Admitting: Orthopedic Surgery

## 2011-10-18 DIAGNOSIS — E559 Vitamin D deficiency, unspecified: Secondary | ICD-10-CM | POA: Insufficient documentation

## 2011-10-18 NOTE — Pre-Procedure Instructions (Signed)
Chest 2 view xray 04-12-2011 epic ekg 08-16-2011 epic

## 2011-10-25 ENCOUNTER — Inpatient Hospital Stay (HOSPITAL_COMMUNITY): Admission: RE | Admit: 2011-10-25 | Payer: Self-pay | Source: Ambulatory Visit

## 2011-11-07 ENCOUNTER — Encounter (HOSPITAL_COMMUNITY): Payer: Self-pay | Admitting: Pharmacy Technician

## 2011-11-08 ENCOUNTER — Other Ambulatory Visit: Payer: Self-pay | Admitting: Physician Assistant

## 2011-11-08 NOTE — H&P (Signed)
CC: left Knee pain HPI: 61    Y/o female    With worsening left knee pain secondary to osteoarthritis. Patient has elected to have a total knee arthroplasty to decrease pain and increase function. ZOX:[id number], GERD, osteoarthritis, dyslipidemia Family History:COPD, hypertension, cancer Social: non smoker, occasional ETOH, no illicit drugs Meds:mobic, azor, HCTZ, zegerid, aspirin, vitamins Allergies: NKDA ROS: Pain with ambulation Vitals: 142/80, 74, 14  PE: Alert and appropriate 61 y/o female   In no acute distress Cranial 2-12 grossly intact Cervical spine full rom without pain Lungs: bilateral breath sounds with no wheeze rhonchi or rales Heart: regular rate and rhythm with no murmur Abd: nontender nondistended with active bowel sounds Ext: moderate pain with rom of bilateral knees with crepitus, antalgic gait, neurovascularly intact distally. No pedal edema Skin: no rashes X-rays: endstage osteoarthritis to left knee A/P: endstage osteoarthritis to left  knee Plan for total knee arthroplasty to decrease pain and increase function.

## 2011-11-10 NOTE — Progress Notes (Signed)
08-16-2011 ekg epic 04-02-2011  Chest 2 view epic

## 2011-11-11 ENCOUNTER — Encounter (HOSPITAL_COMMUNITY)
Admission: RE | Admit: 2011-11-11 | Discharge: 2011-11-11 | Disposition: A | Discharge status: Home / Self Care | Payer: Worker's Compensation | Source: Ambulatory Visit | Attending: Orthopedic Surgery | Admitting: Orthopedic Surgery

## 2011-11-11 ENCOUNTER — Encounter (HOSPITAL_COMMUNITY): Payer: Self-pay

## 2011-11-11 HISTORY — DX: Depression, unspecified: F32.A

## 2011-11-11 HISTORY — DX: Major depressive disorder, single episode, unspecified: F32.9

## 2011-11-11 HISTORY — DX: Chronic obstructive pulmonary disease, unspecified: J44.9

## 2011-11-11 HISTORY — DX: Sleep apnea, unspecified: G47.30

## 2011-11-11 HISTORY — DX: Shortness of breath: R06.02

## 2011-11-11 HISTORY — DX: Unspecified osteoarthritis, unspecified site: M19.90

## 2011-11-11 HISTORY — DX: Unspecified asthma, uncomplicated: J45.909

## 2011-11-11 LAB — SURGICAL PCR SCREEN
MRSA, PCR: POSITIVE — AB
Staphylococcus aureus: POSITIVE — AB

## 2011-11-11 LAB — BASIC METABOLIC PANEL
BUN: 14 mg/dL (ref 6–23)
CO2: 27 mEq/L (ref 19–32)
Calcium: 9.3 mg/dL (ref 8.4–10.5)
Chloride: 106 mEq/L (ref 96–112)
Creatinine, Ser: 0.68 mg/dL (ref 0.50–1.10)
GFR calc Af Amer: >90 mL/min (ref >90)
GFR calc non Af Amer: >90 mL/min (ref >90)
Glucose, Bld: 94 mg/dL (ref 70–99)
Potassium: 3.9 mEq/L (ref 3.5–5.1)
Sodium: 142 mEq/L (ref 135–145)

## 2011-11-11 LAB — CBC
HCT: 42.2 % (ref 36.0–46.0)
Hemoglobin: 13.4 g/dL (ref 12.0–15.0)
MCH: 25.9 pg — ABNORMAL LOW (ref 26.0–34.0)
MCHC: 31.8 g/dL (ref 30.0–36.0)
MCV: 81.5 fL (ref 78.0–100.0)
Platelets: 242 10*3/uL (ref 150–400)
RBC: 5.18 MIL/uL — ABNORMAL HIGH (ref 3.87–5.11)
RDW: 13.9 % (ref 11.5–15.5)
WBC: 6.5 10*3/uL (ref 4.0–10.5)

## 2011-11-11 LAB — PROTIME-INR
INR: 1.09 (ref 0.00–1.49)
Prothrombin Time: 14.3 seconds (ref 11.6–15.2)

## 2011-11-11 LAB — APTT: aPTT: 35 seconds (ref 24–37)

## 2011-11-11 NOTE — Progress Notes (Signed)
11/11/11 1127  OBSTRUCTIVE SLEEP APNEA  Have you ever been diagnosed with sleep apnea through a sleep study? No  Do you snore loudly (loud enough to be heard through closed doors)?  0  Do you often feel tired, fatigued, or sleepy during the daytime? 1  Has anyone observed you stop breathing during your sleep? 0  Do you have, or are you being treated for high blood pressure? 1  BMI more than 35 kg/m2? 1  Age over 63 years old? 1  Neck circumference greater than 40 cm/18 inches? 0  Gender: 0  Obstructive Sleep Apnea Score 4   Score 4 or greater  Updated health history;Results sent to PCP

## 2011-11-11 NOTE — Patient Instructions (Addendum)
20 Azula Zappia Russell-Strader  11/11/2011   Your procedure is scheduled on:  11-22-11  Report to [facility] at  [phone]  Call this number if you have problems the morning of surgery: [phone]   Remember:   Do not eat food or drink liquids after Midnight .   [name] KitchenTake these medicines the morning of surgery with A SIP OF WATER: Albuterol inhaler use as needed and bring inhaler   Do not wear jewelry, make-up or nail polish.  Do not wear lotions, powders, or perfumes. You may wear deodorant.  Do not shave 48 hours prior to surgery. Men may shave face and neck.  Do not bring valuables to the hospital.  Contacts, dentures or bridgework may not be worn into surgery.  Leave suitcase in the car. After surgery it may be brought to your room.  For patients admitted to the hospital, checkout time is 11:00 AM the day of discharge.   Patients discharged the day of surgery will not be allowed to drive home.  Name and phone number of your driver:   Special Instructions: CHG Shower Use Special Wash: 1/2 bottle night before surgery and 1/2 bottle morning of surgery.   Please read over the following fact sheets that you were given: MRSA Information, Blood facts sheet given

## 2011-11-22 ENCOUNTER — Inpatient Hospital Stay (HOSPITAL_COMMUNITY): Payer: Worker's Compensation

## 2011-11-22 ENCOUNTER — Encounter (HOSPITAL_COMMUNITY): Payer: Self-pay | Admitting: Anesthesiology

## 2011-11-22 ENCOUNTER — Encounter (HOSPITAL_COMMUNITY)
Admission: RE | Disposition: A | Discharge status: Home Health | Payer: Self-pay | Source: Ambulatory Visit | Attending: Orthopedic Surgery

## 2011-11-22 ENCOUNTER — Encounter (HOSPITAL_COMMUNITY): Payer: Self-pay | Admitting: *Deleted

## 2011-11-22 ENCOUNTER — Inpatient Hospital Stay (HOSPITAL_COMMUNITY)
Admission: RE | Admit: 2011-11-22 | Discharge: 2011-11-25 | DRG: 470 | Disposition: A | Discharge status: Home Health | Payer: Worker's Compensation | Source: Ambulatory Visit | Attending: Orthopedic Surgery | Admitting: Orthopedic Surgery

## 2011-11-22 ENCOUNTER — Inpatient Hospital Stay (HOSPITAL_COMMUNITY): Payer: Worker's Compensation | Admitting: Anesthesiology

## 2011-11-22 DIAGNOSIS — M1710 Unilateral primary osteoarthritis, unspecified knee: Principal | ICD-10-CM | POA: Diagnosis present

## 2011-11-22 DIAGNOSIS — Z01812 Encounter for preprocedural laboratory examination: Secondary | ICD-10-CM

## 2011-11-22 DIAGNOSIS — I10 Essential (primary) hypertension: Secondary | ICD-10-CM | POA: Diagnosis present

## 2011-11-22 DIAGNOSIS — K219 Gastro-esophageal reflux disease without esophagitis: Secondary | ICD-10-CM | POA: Diagnosis present

## 2011-11-22 DIAGNOSIS — Z96659 Presence of unspecified artificial knee joint: Secondary | ICD-10-CM

## 2011-11-22 HISTORY — PX: TOTAL KNEE ARTHROPLASTY: SHX125

## 2011-11-22 LAB — TYPE AND SCREEN
ABO/RH(D): O POS
Antibody Screen: NEGATIVE

## 2011-11-22 LAB — ABO/RH: ABO/RH(D): O POS

## 2011-11-22 SURGERY — ARTHROPLASTY, KNEE, TOTAL
Anesthesia: General | Site: Knee | Laterality: Left | Wound class: Clean

## 2011-11-22 MED ORDER — ENOXAPARIN SODIUM 40 MG/0.4ML ~~LOC~~ SOLN: 40.0000 mg | SUBCUTANEOUS | Status: DC

## 2011-11-22 MED ORDER — DIPHENHYDRAMINE HCL 50 MG/ML IJ SOLN: 12.5000 mg | Freq: Four times a day (QID) | INTRAMUSCULAR | Status: DC | PRN

## 2011-11-22 MED ORDER — SODIUM CHLORIDE 0.9 % IJ SOLN: 9.0000 mL | INTRAMUSCULAR | Status: DC | PRN

## 2011-11-22 MED ORDER — ONDANSETRON HCL 4 MG/2ML IJ SOLN
INTRAMUSCULAR | Status: DC | PRN
  Administered 2011-11-22: 4 mg via INTRAVENOUS

## 2011-11-22 MED ORDER — METOCLOPRAMIDE HCL 5 MG/ML IJ SOLN: 5.0000 mg | Freq: Three times a day (TID) | INTRAMUSCULAR | Status: DC | PRN

## 2011-11-22 MED ORDER — BUPIVACAINE-EPINEPHRINE PF 0.25-1:200000 % IJ SOLN
INTRAMUSCULAR | Status: DC | PRN
  Administered 2011-11-22: 13 mL

## 2011-11-22 MED ORDER — BUPIVACAINE-EPINEPHRINE 0.5% -1:200000 IJ SOLN
INTRAMUSCULAR | Status: AC
  Filled 2011-11-22: qty 1

## 2011-11-22 MED ORDER — DEXTROSE-NACL 5-0.45 % IV SOLN
INTRAVENOUS | Status: DC
  Administered 2011-11-22 – 2011-11-24 (×5): via INTRAVENOUS

## 2011-11-22 MED ORDER — LACTATED RINGERS IV SOLN: INTRAVENOUS | Status: DC

## 2011-11-22 MED ORDER — DIPHENHYDRAMINE HCL 12.5 MG/5ML PO ELIX: 12.5000 mg | ORAL_SOLUTION | Freq: Four times a day (QID) | ORAL | Status: DC | PRN

## 2011-11-22 MED ORDER — ACETAMINOPHEN 10 MG/ML IV SOLN
INTRAVENOUS | Status: DC | PRN
  Administered 2011-11-22: 1000 mg via INTRAVENOUS

## 2011-11-22 MED ORDER — ALBUTEROL SULFATE HFA 108 (90 BASE) MCG/ACT IN AERS
INHALATION_SPRAY | RESPIRATORY_TRACT | Status: DC | PRN
  Administered 2011-11-22: 8 via RESPIRATORY_TRACT

## 2011-11-22 MED ORDER — AMLODIPINE-OLMESARTAN 10-20 MG PO TABS: 1.0000 | ORAL_TABLET | Freq: Every day | ORAL | Status: DC

## 2011-11-22 MED ORDER — FENTANYL CITRATE 0.05 MG/ML IJ SOLN
INTRAMUSCULAR | Status: DC | PRN
  Administered 2011-11-22: 100 ug via INTRAVENOUS
  Administered 2011-11-22: 50 ug via INTRAVENOUS
  Administered 2011-11-22: 75 ug via INTRAVENOUS
  Administered 2011-11-22: 50 ug via INTRAVENOUS
  Administered 2011-11-22: 75 ug via INTRAVENOUS

## 2011-11-22 MED ORDER — AMLODIPINE BESYLATE 10 MG PO TABS
10.0000 mg | ORAL_TABLET | Freq: Every day | ORAL | Status: DC
Start: 2011-11-23 — End: 2011-11-25
  Administered 2011-11-24: 10 mg via ORAL
  Filled 2011-11-22 (×4): qty 1

## 2011-11-22 MED ORDER — LIDOCAINE HCL (CARDIAC) 20 MG/ML IV SOLN
INTRAVENOUS | Status: DC | PRN
  Administered 2011-11-22: 50 mg via INTRAVENOUS

## 2011-11-22 MED ORDER — HYDROMORPHONE 0.3 MG/ML IV SOLN
INTRAVENOUS | Status: AC
  Filled 2011-11-22: qty 25

## 2011-11-22 MED ORDER — HYDROMORPHONE HCL PF 1 MG/ML IJ SOLN
0.2500 mg | INTRAMUSCULAR | Status: DC | PRN
  Administered 2011-11-22 (×4): 0.5 mg via INTRAVENOUS

## 2011-11-22 MED ORDER — NALOXONE HCL 0.4 MG/ML IJ SOLN: 0.4000 mg | INTRAMUSCULAR | Status: DC | PRN

## 2011-11-22 MED ORDER — POVIDONE-IODINE 7.5 % EX SOLN: Freq: Once | CUTANEOUS | Status: DC

## 2011-11-22 MED ORDER — LACTATED RINGERS IV SOLN
INTRAVENOUS | Status: DC
  Administered 2011-11-22: 1000 mL via INTRAVENOUS
  Administered 2011-11-22 (×2): via INTRAVENOUS

## 2011-11-22 MED ORDER — PROPOFOL 10 MG/ML IV BOLUS
INTRAVENOUS | Status: DC | PRN
  Administered 2011-11-22: 180 mg via INTRAVENOUS

## 2011-11-22 MED ORDER — ONDANSETRON HCL 4 MG/2ML IJ SOLN: 4.0000 mg | Freq: Four times a day (QID) | INTRAMUSCULAR | Status: DC | PRN

## 2011-11-22 MED ORDER — STERILE WATER FOR IRRIGATION IR SOLN
Status: DC | PRN
  Administered 2011-11-22: 1500 mL

## 2011-11-22 MED ORDER — CEFAZOLIN SODIUM-DEXTROSE 2-3 GM-% IV SOLR
2.0000 g | Freq: Four times a day (QID) | INTRAVENOUS | Status: AC
  Administered 2011-11-22 – 2011-11-23 (×3): 2 g via INTRAVENOUS
  Filled 2011-11-22 (×3): qty 50

## 2011-11-22 MED ORDER — OLMESARTAN MEDOXOMIL 20 MG PO TABS
20.0000 mg | ORAL_TABLET | Freq: Every day | ORAL | Status: DC
Start: 2011-11-23 — End: 2011-11-25
  Administered 2011-11-24: 20 mg via ORAL
  Filled 2011-11-22 (×4): qty 1

## 2011-11-22 MED ORDER — DEXAMETHASONE SODIUM PHOSPHATE 4 MG/ML IJ SOLN
INTRAMUSCULAR | Status: DC | PRN
  Administered 2011-11-22: 10 mg via INTRAVENOUS

## 2011-11-22 MED ORDER — SUCCINYLCHOLINE CHLORIDE 20 MG/ML IJ SOLN
INTRAMUSCULAR | Status: DC | PRN
  Administered 2011-11-22: 90 mg via INTRAVENOUS

## 2011-11-22 MED ORDER — SODIUM CHLORIDE 0.9 % IV BOLUS (SEPSIS)
500.0000 mL | Freq: Once | INTRAVENOUS | Status: AC
  Administered 2011-11-22: 500 mL via INTRAVENOUS

## 2011-11-22 MED ORDER — CEFAZOLIN SODIUM-DEXTROSE 2-3 GM-% IV SOLR
2.0000 g | INTRAVENOUS | Status: AC
  Administered 2011-11-22: 2 g via INTRAVENOUS

## 2011-11-22 MED ORDER — MEPERIDINE HCL 50 MG/ML IJ SOLN: 6.2500 mg | INTRAMUSCULAR | Status: DC | PRN

## 2011-11-22 MED ORDER — DEXTROSE 5 % IV SOLN
500.0000 mg | Freq: Four times a day (QID) | INTRAVENOUS | Status: DC | PRN
  Administered 2011-11-22 – 2011-11-23 (×3): 500 mg via INTRAVENOUS
  Filled 2011-11-22 (×3): qty 5

## 2011-11-22 MED ORDER — BUPIVACAINE-EPINEPHRINE 0.25% -1:200000 IJ SOLN
INTRAMUSCULAR | Status: AC
  Filled 2011-11-22: qty 1

## 2011-11-22 MED ORDER — ALUM & MAG HYDROXIDE-SIMETH 200-200-20 MG/5ML PO SUSP
30.0000 mL | ORAL | Status: DC | PRN
  Administered 2011-11-22 – 2011-11-24 (×3): 30 mL via ORAL
  Filled 2011-11-22 (×3): qty 30

## 2011-11-22 MED ORDER — HYDROMORPHONE 0.3 MG/ML IV SOLN
INTRAVENOUS | Status: DC
  Administered 2011-11-22: 2.7 mg via INTRAVENOUS
  Administered 2011-11-22: 18:00:00 via INTRAVENOUS
  Administered 2011-11-23: 1.2 mg via INTRAVENOUS
  Administered 2011-11-23: 1.5 mg via INTRAVENOUS
  Administered 2011-11-23 (×2): 0.6 mg via INTRAVENOUS

## 2011-11-22 MED ORDER — PROMETHAZINE HCL 25 MG/ML IJ SOLN: 6.2500 mg | INTRAMUSCULAR | Status: DC | PRN

## 2011-11-22 MED ORDER — CITALOPRAM HYDROBROMIDE 20 MG PO TABS
20.0000 mg | ORAL_TABLET | Freq: Every evening | ORAL | Status: DC
  Administered 2011-11-22 – 2011-11-25 (×4): 20 mg via ORAL
  Filled 2011-11-22 (×4): qty 1

## 2011-11-22 MED ORDER — CALCIUM CARBONATE-VITAMIN D 500-200 MG-UNIT PO TABS
2.0000 | ORAL_TABLET | Freq: Every day | ORAL | Status: DC
  Administered 2011-11-22 – 2011-11-25 (×4): 2 via ORAL
  Filled 2011-11-22 (×4): qty 2

## 2011-11-22 MED ORDER — AMLODIPINE BESYLATE 10 MG PO TABS
10.0000 mg | ORAL_TABLET | Freq: Once | ORAL | Status: DC
  Filled 2011-11-22: qty 1

## 2011-11-22 MED ORDER — ONDANSETRON HCL 4 MG PO TABS: 4.0000 mg | ORAL_TABLET | Freq: Four times a day (QID) | ORAL | Status: DC | PRN

## 2011-11-22 MED ORDER — HYDROMORPHONE HCL PF 1 MG/ML IJ SOLN
INTRAMUSCULAR | Status: AC
  Filled 2011-11-22: qty 1

## 2011-11-22 MED ORDER — EPHEDRINE SULFATE 50 MG/ML IJ SOLN
INTRAMUSCULAR | Status: DC | PRN
  Administered 2011-11-22 (×4): 5 mg via INTRAVENOUS

## 2011-11-22 MED ORDER — ALBUTEROL SULFATE HFA 108 (90 BASE) MCG/ACT IN AERS
2.0000 | INHALATION_SPRAY | Freq: Four times a day (QID) | RESPIRATORY_TRACT | Status: DC | PRN
  Filled 2011-11-22: qty 6.7

## 2011-11-22 MED ORDER — CALCIUM CARBONATE-VIT D-MIN 600-800 MG-UNIT PO TABS: 2.0000 | ORAL_TABLET | Freq: Every day | ORAL | Status: DC

## 2011-11-22 MED ORDER — MIDAZOLAM HCL 5 MG/5ML IJ SOLN
INTRAMUSCULAR | Status: DC | PRN
  Administered 2011-11-22: 2 mg via INTRAVENOUS

## 2011-11-22 MED ORDER — RIVAROXABAN 10 MG PO TABS
10.0000 mg | ORAL_TABLET | Freq: Every day | ORAL | Status: DC
  Administered 2011-11-23 – 2011-11-25 (×3): 10 mg via ORAL
  Filled 2011-11-22 (×3): qty 1

## 2011-11-22 MED ORDER — SODIUM CHLORIDE 0.9 % IR SOLN
Status: DC | PRN
  Administered 2011-11-22: 1000 mL

## 2011-11-22 MED ORDER — METOCLOPRAMIDE HCL 10 MG PO TABS: 5.0000 mg | ORAL_TABLET | Freq: Three times a day (TID) | ORAL | Status: DC | PRN

## 2011-11-22 SURGICAL SUPPLY — 63 items
BAG ZIPLOCK 12X15 (MISCELLANEOUS) ×4 IMPLANT
BANDAGE ELASTIC 4 VELCRO ST LF (GAUZE/BANDAGES/DRESSINGS) ×2 IMPLANT
BANDAGE ELASTIC 6 VELCRO ST LF (GAUZE/BANDAGES/DRESSINGS) ×2 IMPLANT
BANDAGE ESMARK 6X9 LF (GAUZE/BANDAGES/DRESSINGS) ×1 IMPLANT
BANDAGE GAUZE ELAST BULKY 4 IN (GAUZE/BANDAGES/DRESSINGS) ×4 IMPLANT
BLADE SAG 18X100X1.27 (BLADE) ×2 IMPLANT
BNDG COHESIVE 4X5 TAN NS LF (GAUZE/BANDAGES/DRESSINGS) ×2 IMPLANT
BNDG ESMARK 6X9 LF (GAUZE/BANDAGES/DRESSINGS) ×2
BOWL SMART MIX CTS (DISPOSABLE) ×2 IMPLANT
CEMENT BONE 1-PACK (Cement) ×4 IMPLANT
CEMENT HV SMART SET (Cement) ×2 IMPLANT
CLOTH BEACON ORANGE TIMEOUT ST (SAFETY) ×2 IMPLANT
CONT SPECI 4OZ STER CLIK (MISCELLANEOUS) ×2 IMPLANT
CUFF TOURN SGL QUICK 34 (TOURNIQUET CUFF) ×1
CUFF TRNQT CYL 34X4X40X1 (TOURNIQUET CUFF) ×1 IMPLANT
DRAPE EXTREMITY T 121X128X90 (DRAPE) ×2 IMPLANT
DRAPE LG THREE QUARTER DISP (DRAPES) ×2 IMPLANT
DRAPE POUCH INSTRU U-SHP 10X18 (DRAPES) ×2 IMPLANT
DRAPE U-SHAPE 47X51 STRL (DRAPES) ×2 IMPLANT
DRSG ADAPTIC 3X8 NADH LF (GAUZE/BANDAGES/DRESSINGS) ×2 IMPLANT
DRSG EMULSION OIL 3X16 NADH (GAUZE/BANDAGES/DRESSINGS) ×2 IMPLANT
DRSG PAD ABDOMINAL 8X10 ST (GAUZE/BANDAGES/DRESSINGS) ×4 IMPLANT
ELECT BLADE TIP CTD 4 INCH (ELECTRODE) ×2 IMPLANT
ELECT REM PT RETURN 9FT ADLT (ELECTROSURGICAL) ×2
ELECTRODE REM PT RTRN 9FT ADLT (ELECTROSURGICAL) ×1 IMPLANT
EVACUATOR 1/8 PVC DRAIN (DRAIN) ×2 IMPLANT
FACESHIELD LNG OPTICON STERILE (SAFETY) ×10 IMPLANT
GLOVE BIOGEL PI IND STRL 7.0 (GLOVE) ×2 IMPLANT
GLOVE BIOGEL PI INDICATOR 7.0 (GLOVE) ×2
GLOVE ECLIPSE 8.0 STRL XLNG CF (GLOVE) ×4 IMPLANT
GLOVE INDICATOR 8.0 STRL GRN (GLOVE) ×6 IMPLANT
GLOVE SURG SS PI 6.5 STRL IVOR (GLOVE) ×6 IMPLANT
GOWN STRL NON-REIN LRG LVL3 (GOWN DISPOSABLE) ×4 IMPLANT
GOWN STRL REIN XL XLG (GOWN DISPOSABLE) ×4 IMPLANT
HANDPIECE INTERPULSE COAX TIP (DISPOSABLE) ×1
IMMOBILIZER KNEE 20 (SOFTGOODS) ×2
IMMOBILIZER KNEE 20 THIGH 36 (SOFTGOODS) ×1 IMPLANT
INSERT TIBIA SCORPIO FLEX 7X10 (Orthopedic Implant) IMPLANT
KIT BASIN OR (CUSTOM PROCEDURE TRAY) ×2 IMPLANT
MANIFOLD NEPTUNE II (INSTRUMENTS) ×2 IMPLANT
NEEDLE HYPO 22GX1.5 SAFETY (NEEDLE) ×2 IMPLANT
NS IRRIG 1000ML POUR BTL (IV SOLUTION) ×4 IMPLANT
PACK TOTAL JOINT (CUSTOM PROCEDURE TRAY) ×2 IMPLANT
POSITIONER SURGICAL ARM (MISCELLANEOUS) ×2 IMPLANT
SET HNDPC FAN SPRY TIP SCT (DISPOSABLE) ×1 IMPLANT
SPONGE GAUZE 4X4 12PLY (GAUZE/BANDAGES/DRESSINGS) ×2 IMPLANT
SPONGE LAP 18X18 X RAY DECT (DISPOSABLE) IMPLANT
SPONGE SURGIFOAM ABS GEL 100 (HEMOSTASIS) IMPLANT
STAPLER VISISTAT 35W (STAPLE) ×4 IMPLANT
STEM REGULAR FLUTED (Stem) ×2 IMPLANT
SUCTION FRAZIER 12FR DISP (SUCTIONS) ×2 IMPLANT
SUT BONE WAX W31G (SUTURE) ×2 IMPLANT
SUT VIC AB 1 CT1 27 (SUTURE) ×6
SUT VIC AB 1 CT1 27XBRD ANTBC (SUTURE) ×6 IMPLANT
SUT VIC AB 2-0 CT1 27 (SUTURE) ×2
SUT VIC AB 2-0 CT1 27XBRD (SUTURE) ×2 IMPLANT
SYR 20CC LL (SYRINGE) ×2 IMPLANT
TOWEL OR 17X26 10 PK STRL BLUE (TOWEL DISPOSABLE) ×4 IMPLANT
TOWER CARTRIDGE SMART MIX (DISPOSABLE) ×2 IMPLANT
TRAY FOLEY CATH 14FRSI W/METER (CATHETERS) ×2 IMPLANT
TRAY TIBIAL  #7TS (Orthopedic Implant) ×2 IMPLANT
WATER STERILE IRR 1500ML POUR (IV SOLUTION) ×2 IMPLANT
WRAP KNEE MAXI GEL POST OP (GAUZE/BANDAGES/DRESSINGS) ×4 IMPLANT

## 2011-11-22 NOTE — Anesthesia Procedure Notes (Signed)
Procedure Name: Intubation Date/Time: 11/22/2011 1:42 PM Performed by: Maris Berger T Pre-anesthesia Checklist: Patient identified, Emergency Drugs available, Suction available and Patient being monitored Patient Re-evaluated:Patient Re-evaluated prior to inductionOxygen Delivery Method: Circle System Utilized Preoxygenation: Pre-oxygenation with 100% oxygen Intubation Type: IV induction Ventilation: Mask ventilation without difficulty Laryngoscope Size: Mac and 3 Grade View: Grade I Tube type: Oral Tube size: 7.0 mm Number of attempts: 1 Airway Equipment and Method: stylet Placement Confirmation: ETT inserted through vocal cords under direct vision,  positive ETCO2 and breath sounds checked- equal and bilateral Secured at: 21 cm Tube secured with: Tape Dental Injury: Teeth and Oropharynx as per pre-operative assessment

## 2011-11-22 NOTE — Brief Op Note (Signed)
11/22/2011  5:02 PM  PATIENT:  Kathryn Olsen  61 y.o. female  PRE-OPERATIVE DIAGNOSIS:  osteoarthritis left knee  POST-OPERATIVE DIAGNOSIS:  osteoarthritis left knee  PROCEDURE:  Procedure(s) (LRB) with comments: TOTAL KNEE ARTHROPLASTY (Left)  SURGEON:  Surgeon(s) and Role:    * Jacki Cones, MD - Assisting    * Drucilla Schmidt, MD - Primary  PHYSICIAN ASSISTANT:   ASSISTANTS:nurse  ANESTHESIA:   general  EBL:  Total I/O In: 2400 [I.V.:2400] Out: 575 [Urine:475; Blood:100]  BLOOD ADMINISTERED:none  DRAINS: (knee) Hemovact drain(s) in the left knee with  Suction Open   LOCAL MEDICATIONS USED:  MARCAINE     SPECIMEN:  No Specimen  DISPOSITION OF SPECIMEN:  N/A  COUNTS:  YES  TOURNIQUET:   Total Tourniquet Time Documented: Thigh (Left) - 120 minutes  DICTATION: .Other Dictation: Dictation Number [id number]  PLAN OF CARE: Admit to inpatient   PATIENT DISPOSITION:  PACU - hemodynamically stable.   Delay start of Pharmacological VTE agent (>24hrs) due to surgical blood loss or risk of bleeding: yes

## 2011-11-22 NOTE — Progress Notes (Signed)
Dr. Simonne Come   In- made aware of no hemovac drainage and moderate amount of bloody drainage inderneath ace wrap dressing

## 2011-11-22 NOTE — H&P (Signed)
Kathryn Olsen is an 61 y.o. female.   Chief Complaint:painful lt knee ZOX:WRUEA demonstrate end stage osteoarthritis  Past Medical History  Diagnosis Date  . Degeneration of intervertebral disc, site unspecified   . Dyslipidemia   . Unspecified essential hypertension   . DJD (degenerative joint disease)   . Asthma   . COPD (chronic obstructive pulmonary disease)     questionable  . Depression   . Shortness of breath     on exertion  . Sleep apnea     stopbang=4    Past Surgical History  Procedure Date  . Appendectomy   . Ectopic pregnancy surgery   . Knee arthroscopy 2010     Right knee  . Knee arthroscopy 2012    Left knee    Family History  Problem Relation Age of Onset  . Lung cancer Father   . COPD Mother   . Hypertension Mother   . Alcohol abuse Brother   . Melanoma Brother    Social History:  reports that she has never smoked. She has never used smokeless tobacco. She reports that she drinks about .6 ounces of alcohol per week. She reports that she does not use illicit drugs.  Allergies:  Allergies  Allergen Reactions  . Morphine And Related Anaphylaxis and Itching    Medications Prior to Admission  Medication Sig Dispense Refill  . amlodipine-olmesartan (AZOR) 10-20 MG per tablet Take 1 tablet by mouth daily with breakfast.      . Calcium Carbonate-Vit D-Min 600-800 MG-UNIT TABS Take 2 tablets by mouth daily.      . meloxicam (MOBIC) 15 MG tablet Take 15 mg by mouth daily with breakfast.       . albuterol (PROVENTIL HFA;VENTOLIN HFA) 108 (90 BASE) MCG/ACT inhaler Inhale 2 puffs into the lungs every 6 (six) hours as needed. Wheezing      . citalopram (CELEXA) 20 MG tablet Take 20 mg by mouth every evening.         Results for orders placed during the hospital encounter of 11/22/11 (from the past 48 hour(s))  TYPE AND SCREEN     Status: Normal   Collection Time   11/22/11 10:55 AM      Component Value Range Comment   ABO/RH(D) O POS      Antibody Screen NEG      Sample Expiration 11/25/2011     ABO/RH     Status: Normal   Collection Time   11/22/11 10:55 AM      Component Value Range Comment   ABO/RH(D) O POS      No results found.  ROS  Blood pressure 127/84, pulse 80, temperature 98 F (36.7 C), temperature source Oral, resp. rate 16, height 5\' 6"  (1.676 m), weight 104.327 kg (230 lb), SpO2 96.00%. Physical Exam  Constitutional: She is oriented to person, place, and time. She appears well-developed and well-nourished.  HENT:  Head: Normocephalic and atraumatic.  Right Ear: External ear normal.  Left Ear: External ear normal.  Eyes: Conjunctivae and EOM are normal. Pupils are equal, round, and reactive to light.  Neck: Normal range of motion. Neck supple.  Cardiovascular: Normal rate, regular rhythm, normal heart sounds and intact distal pulses.   Respiratory: Effort normal and breath sounds normal.  GI: Soft. Bowel sounds are normal.  Musculoskeletal: She exhibits tenderness.       Painful lt knee with decreased ROM  Neurological: She is alert and oriented to person, place, and time. She has normal  reflexes.  Skin: Skin is warm and dry.  Psychiatric: She has a normal mood and affect. Her behavior is normal. Judgment and thought content normal.     Assessment/Plan Painful end-stage osteoarthritis lt knee TKR lt  Tyaira Heward P 11/22/2011, 1:27 PM

## 2011-11-22 NOTE — Progress Notes (Signed)
Dr. Acey Lav in- made aware of patient's heart rates.

## 2011-11-22 NOTE — Anesthesia Preprocedure Evaluation (Addendum)
Anesthesia Evaluation  Patient identified by MRN, date of birth, ID band Patient awake    Reviewed: Allergy & Precautions, H&P , NPO status , Patient's Chart, lab work & pertinent test results  Airway Mallampati: II TM Distance: >3 FB Neck ROM: Full    Dental No notable dental hx.    Pulmonary neg pulmonary ROS, asthma ,  breath sounds clear to auscultation  Pulmonary exam normal       Cardiovascular hypertension, Pt. on medications negative cardio ROS  Rhythm:Regular Rate:Normal     Neuro/Psych negative neurological ROS  negative psych ROS   GI/Hepatic negative GI ROS, Neg liver ROS, GERD-  Medicated and Controlled,  Endo/Other  negative endocrine ROS  Renal/GU negative Renal ROS  negative genitourinary   Musculoskeletal negative musculoskeletal ROS (+)   Abdominal   Peds negative pediatric ROS (+)  Hematology negative hematology ROS (+)   Anesthesia Other Findings   Reproductive/Obstetrics negative OB ROS                           Anesthesia Physical Anesthesia Plan  ASA: II  Anesthesia Plan: General   Post-op Pain Management:    Induction: Intravenous  Airway Management Planned: Oral ETT  Additional Equipment:   Intra-op Plan:   Post-operative Plan: Extubation in OR  Informed Consent: I have reviewed the patients History and Physical, chart, labs and discussed the procedure including the risks, benefits and alternatives for the proposed anesthesia with the patient or authorized representative who has indicated his/her understanding and acceptance.   Dental advisory given  Plan Discussed with: CRNA  Anesthesia Plan Comments:         Anesthesia Quick Evaluation

## 2011-11-22 NOTE — Anesthesia Postprocedure Evaluation (Signed)
  Anesthesia Post-op Note  Patient: Kathryn Olsen  Procedure(s) Performed: Procedure(s) (LRB): TOTAL KNEE ARTHROPLASTY (Left)  Patient Location: PACU  Anesthesia Type: General  Level of Consciousness: awake and alert   Airway and Oxygen Therapy: Patient Spontanous Breathing  Post-op Pain: mild  Post-op Assessment: Post-op Vital signs reviewed, Patient's Cardiovascular Status Stable, Respiratory Function Stable, Patent Airway and No signs of Nausea or vomiting  Post-op Vital Signs: stable  Complications: No apparent anesthesia complications

## 2011-11-22 NOTE — Progress Notes (Signed)
Patient's 2200 BP was 73/53 and was retaken a second time 5 minutes later and read 88/59. On call PA [facility] paged with orders to give a 500cc NS bolus. Patient also c/o indigestion. Orders given for Maalox 30ml q4H prn for heartburn and indigestion. Will continue to monitor patient.

## 2011-11-22 NOTE — Transfer of Care (Signed)
Immediate Anesthesia Transfer of Care Note  Patient: Kathryn Olsen  Procedure(s) Performed: Procedure(s) (LRB) with comments: TOTAL KNEE ARTHROPLASTY (Left)  Patient Location: PACU  Anesthesia Type: General  Level of Consciousness: awake, alert , oriented and patient cooperative  Airway & Oxygen Therapy: Patient Spontanous Breathing and Patient connected to face mask oxygen  Post-op Assessment: Report given to PACU RN, Post -op Vital signs reviewed and stable and Patient moving all extremities X 4  Post vital signs: Reviewed and stable  Complications: No apparent anesthesia complications

## 2011-11-22 NOTE — Progress Notes (Signed)
Portable AP AND LATERAL LEFT KNEE X-RAYS DONE

## 2011-11-23 ENCOUNTER — Encounter (HOSPITAL_COMMUNITY): Payer: Self-pay | Admitting: Orthopedic Surgery

## 2011-11-23 LAB — BASIC METABOLIC PANEL
BUN: 18 mg/dL (ref 6–23)
CO2: 23 mEq/L (ref 19–32)
Calcium: 9 mg/dL (ref 8.4–10.5)
Chloride: 96 mEq/L (ref 96–112)
Creatinine, Ser: 1.16 mg/dL — ABNORMAL HIGH (ref 0.50–1.10)
GFR calc Af Amer: 57 mL/min — ABNORMAL LOW (ref >90)
GFR calc non Af Amer: 49 mL/min — ABNORMAL LOW (ref >90)
Glucose, Bld: 183 mg/dL — ABNORMAL HIGH (ref 70–99)
Potassium: 4 mEq/L (ref 3.5–5.1)
Sodium: 133 mEq/L — ABNORMAL LOW (ref 135–145)

## 2011-11-23 LAB — CBC
HCT: 33.1 % — ABNORMAL LOW (ref 36.0–46.0)
Hemoglobin: 10.1 g/dL — ABNORMAL LOW (ref 12.0–15.0)
MCH: 25.8 pg — ABNORMAL LOW (ref 26.0–34.0)
MCHC: 30.5 g/dL (ref 30.0–36.0)
MCV: 84.7 fL (ref 78.0–100.0)
Platelets: 281 10*3/uL (ref 150–400)
RBC: 3.91 MIL/uL (ref 3.87–5.11)
RDW: 14.9 % (ref 11.5–15.5)
WBC: 15.7 10*3/uL — ABNORMAL HIGH (ref 4.0–10.5)

## 2011-11-23 MED ORDER — NON FORMULARY: 40.0000 mg | Freq: Every day | Status: DC

## 2011-11-23 MED ORDER — OXYCODONE-ACETAMINOPHEN 5-325 MG PO TABS
1.0000 | ORAL_TABLET | ORAL | Status: DC | PRN
  Administered 2011-11-23 (×2): 2 via ORAL
  Administered 2011-11-23 (×2): 1 via ORAL
  Administered 2011-11-24 – 2011-11-25 (×7): 2 via ORAL
  Filled 2011-11-23: qty 1
  Filled 2011-11-23 (×8): qty 2
  Filled 2011-11-23: qty 1
  Filled 2011-11-23: qty 2

## 2011-11-23 MED ORDER — METHOCARBAMOL 500 MG PO TABS
500.0000 mg | ORAL_TABLET | Freq: Four times a day (QID) | ORAL | Status: DC | PRN
  Administered 2011-11-23 – 2011-11-25 (×4): 500 mg via ORAL
  Filled 2011-11-23 (×4): qty 1

## 2011-11-23 MED ORDER — HYDROMORPHONE HCL PF 1 MG/ML IJ SOLN
0.5000 mg | INTRAMUSCULAR | Status: DC | PRN
  Administered 2011-11-24: 1 mg via INTRAVENOUS
  Filled 2011-11-23: qty 1

## 2011-11-23 MED ORDER — ACETAMINOPHEN 325 MG PO TABS: 650.0000 mg | ORAL_TABLET | Freq: Four times a day (QID) | ORAL | Status: DC | PRN

## 2011-11-23 MED ORDER — SODIUM BICARBONATE 650 MG PO TABS
1300.0000 mg | ORAL_TABLET | Freq: Every day | ORAL | Status: DC
  Administered 2011-11-23 – 2011-11-25 (×3): 1300 mg via ORAL
  Filled 2011-11-23 (×3): qty 2

## 2011-11-23 MED ORDER — OMEPRAZOLE 20 MG PO CPDR
40.0000 mg | DELAYED_RELEASE_CAPSULE | Freq: Every day | ORAL | Status: DC
  Administered 2011-11-23 – 2011-11-25 (×3): 40 mg via ORAL
  Filled 2011-11-23 (×3): qty 2

## 2011-11-23 MED ORDER — ACETAMINOPHEN 325 MG PO TABS
ORAL_TABLET | ORAL | Status: AC
  Administered 2011-11-23: 650 mg
  Filled 2011-11-23: qty 2

## 2011-11-23 MED ORDER — PANTOPRAZOLE SODIUM 40 MG PO TBEC
40.0000 mg | DELAYED_RELEASE_TABLET | Freq: Every day | ORAL | Status: DC
  Filled 2011-11-23: qty 1

## 2011-11-23 NOTE — Progress Notes (Signed)
Utilization review completed.  

## 2011-11-23 NOTE — Progress Notes (Signed)
Patient's dressing was saturated in serosanguinous blood r/t hemovac not properly draining. Old ace wrap was removed and discarded and guaze was reinforced with abd pads and wrapped with a new ace wrap. Will continue to monitor patient.

## 2011-11-23 NOTE — Progress Notes (Signed)
Patient ID: Kathryn Olsen, female   DOB: 30-Dec-1950, 61 y.o.   MRN: [id number] POD 1--alert.  Moves foot well.  Already almost off PCA.  Hgb-10.1. Hemovac out.  Post-op xrays look good.

## 2011-11-23 NOTE — Evaluation (Signed)
Physical Therapy Evaluation Patient Details Name: Kathryn Olsen MRN: [id number] DOB: October 17, 1950 Today's Date: 11/23/2011 Time: 7829-5621 PT Time Calculation (min): 33 min  PT Assessment / Plan / Recommendation Clinical Impression  61 y.o. female POD #1 for L TKA. Min assist for bed mobility, transfers, and ambulating 10' with RW. HHPT recommended, pt has DME. Good progress expected. Pt would benefit from acute PT to maximize safety and independence with mobility.     PT Assessment  Patient needs continued PT services    Follow Up Recommendations  Home health PT;Supervision for mobility/OOB    Barriers to Discharge None      Equipment Recommendations  None recommended by PT    Recommendations for Other Services OT consult   Frequency 7X/week    Precautions / Restrictions Precautions Required Braces or Orthoses: Knee Immobilizer - Left Restrictions Weight Bearing Restrictions: No Other Position/Activity Restrictions: WBAT   Pertinent Vitals/Pain *5/10 L knee pain after walking, ice applied SaO2 94% on RA with walking**      Mobility  Bed Mobility Bed Mobility: Supine to Sit Supine to Sit: 4: Min assist Details for Bed Mobility Assistance: min A to support LLE Transfers Transfers: Sit to Stand;Stand to Sit Sit to Stand: From bed;4: Min assist Stand to Sit: To chair/3-in-1;4: Min assist Details for Transfer Assistance: VCs hand placement Ambulation/Gait Ambulation/Gait Assistance: 4: Min guard Ambulation Distance (Feet): 10 Feet Assistive device: Rolling walker Ambulation/Gait Assistance Details: min/guard for balance, VCs for flexed neck and sequencing with RW Gait Pattern: Step-to pattern;Decreased step length - left    Exercises Total Joint Exercises Ankle Circles/Pumps: AROM;Both;10 reps;Seated   PT Diagnosis: Difficulty walking;Acute pain  PT Problem List: Decreased strength;Decreased range of motion;Decreased activity tolerance;Decreased  mobility;Pain PT Treatment Interventions: Stair training;Gait training;Therapeutic exercise;Therapeutic activities;Patient/family education   PT Goals Acute Rehab PT Goals PT Goal Formulation: With patient Time For Goal Achievement: 11/26/11 Potential to Achieve Goals: Good Pt will go Supine/Side to Sit: with modified independence;with HOB 0 degrees PT Goal: Supine/Side to Sit - Progress: Goal set today Pt will go Sit to Stand: with modified independence;with upper extremity assist PT Goal: Sit to Stand - Progress: Goal set today Pt will Ambulate: 51 - 150 feet;with supervision;with rolling walker PT Goal: Ambulate - Progress: Goal set today Pt will Go Up / Down Stairs: 1-2 stairs;with min assist;with rail(s) PT Goal: Up/Down Stairs - Progress: Goal set today Pt will Perform Home Exercise Program: with min assist PT Goal: Perform Home Exercise Program - Progress: Goal set today  Visit Information  Last PT Received On: 11/23/11 Assistance Needed: +1    Subjective Data  Subjective: My right knee is worse than my left. Patient Stated Goal: return to independence with mobility   Prior Functioning  Home Living Lives With: Spouse Available Help at Discharge: Family Type of Home: House Home Access: Stairs to enter [employer] of Steps: 2 Entrance Stairs-Rails: Can reach both;Left;Right Home Layout: Two level;Able to live on main level with bedroom/bathroom Alternate Level Stairs-Number of Steps: 14 Alternate Level Stairs-Rails: Right;Left;Can reach both Bathroom Shower/Tub: Health visitor: Handicapped height Home Adaptive Equipment: Engineer, drilling - four wheeled;Straight cane;Grab bars in shower Prior Function Level of Independence: Independent with assistive device(s) Able to Take Stairs?: Yes Driving: Yes 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 Communication Communication: No difficulties    Cognition  Overall  Cognitive Status: Appears within functional limits for tasks assessed/performed Arousal/Alertness: Awake/alert Orientation Level: Appears intact for tasks assessed Behavior  During Session: Kathryn Olsen for tasks performed    Extremity/Trunk Assessment Right Upper Extremity Assessment RUE ROM/Strength/Tone: Kathryn Olsen for tasks assessed Left Upper Extremity Assessment LUE ROM/Strength/Tone: Kathryn Olsen for tasks assessed Right Lower Extremity Assessment RLE ROM/Strength/Tone: Within functional levels RLE Sensation: WFL - Light Touch RLE Coordination: WFL - gross/fine motor Left Lower Extremity Assessment LLE ROM/Strength/Tone: Deficits LLE Sensation: Deficits LLE Sensation Deficits: decreased to light touch L foot LLE Coordination: WFL - gross/fine motor Trunk Assessment Trunk Assessment: Normal   Balance Balance Balance Assessed: Yes Static Sitting Balance Static Sitting - Balance Support: Bilateral upper extremity supported;Feet supported Static Sitting - Level of Assistance: 6: Modified independent (Device/Increase time) Static Sitting - Comment/# of Minutes: 3  End of Session PT - End of Session Equipment Utilized During Treatment: Gait belt;Left knee immobilizer Patient left: in chair;with call bell/phone within reach Nurse Communication: Mobility status  GP     [name] 11/23/2011, 11:26 AM [phone]

## 2011-11-23 NOTE — Progress Notes (Signed)
Physical Therapy Treatment Patient Details Name: Kathryn Olsen MRN: [id number] DOB: [birth year] Today's Date: 11/23/2011 Time: 6578-4696 PT Time Calculation (min): 25 min  PT Assessment / Plan / Recommendation Comments on Treatment Session  POD # 1 pm session.  Amb pt in hallway then assisted back to bed.    Follow Up Recommendations  Home health PT;Supervision - Intermittent    Barriers to Discharge        Equipment Recommendations  None recommended by PT    Recommendations for Other Services    Frequency 7X/week   Plan Discharge plan remains appropriate    Precautions / Restrictions Precautions Precautions: Knee Precaution Comments: Pt instructed on KI use for amb Required Braces or Orthoses: Knee Immobilizer - Left Knee Immobilizer - Left: Discontinue once straight leg raise with < 10 degree lag Restrictions Weight Bearing Restrictions: No Other Position/Activity Restrictions: WBAT   Pertinent Vitals/Pain C/o 5/10 L knee pain    Mobility  Bed Mobility Bed Mobility: Sit to Supine Sit to Supine: 3: Mod assist Details for Bed Mobility Assistance: Assisted pt back to bed  Transfers Transfers: Sit to Stand Sit to Stand: 4: Min assist;3: Mod assist;From chair/3-in-1 Stand to Sit: 4: Min assist;3: Mod assist;To chair/3-in-1 Details for Transfer Assistance: 25% VC's on proper tech and hand placement plus increased time  Ambulation/Gait Ambulation/Gait Assistance: 4: Min assist Ambulation Distance (Feet): 18 Feet Assistive device: Rolling walker Ambulation/Gait Assistance Details: 25% VC's on proper sequencing and increased time due to MAX c/o fatigue Gait Pattern: Step-to pattern;Decreased stance time - left Gait velocity: decreased      PT Goals                           progressing    Visit Information  Last PT Received On: 11/23/11 Assistance Needed: +1                   End of Session PT - End of Session Equipment Utilized During Treatment:  Gait belt;Left knee immobilizer Activity Tolerance: Patient limited by fatigue Patient left: in bed;with call bell/phone within reach CPM Left Knee CPM Left Knee: On Left Knee Flexion (Degrees): 30  Left Knee Extension (Degrees): 0    Felecia Shelling  PTA [facility]  Acute  Rehab Pager     [phone]

## 2011-11-23 NOTE — Op Note (Signed)
NAMEBELKIS, REEVE       ACCOUNT NO.:  [id number]  MEDICAL RECORD NO.:  [id number]  LOCATION:  1602                         FACILITY:  [facility]  PHYSICIAN:  Marlowe Kays, M.D.  DATE OF BIRTH:  1950/05/14  DATE OF PROCEDURE:  11/22/2011 DATE OF DISCHARGE:                              OPERATIVE REPORT   PREOPERATIVE DIAGNOSIS:  End-stage osteoarthritis, left knee.  POSTOPERATIVE DIAGNOSIS:  End-stage osteoarthritis, left knee.  OPERATION:  Osteonics total knee replacement, left.  SURGEON:  Marlowe Kays, MD  ASSISTANT:  Georges Lynch. Gioffre, MD  ANESTHESIA:  General.  PATHOLOGY AND JUSTIFICATION FOR PROCEDURE:  Very painful knee with bone- on-bone abutment medially and medial subluxation of the femur on the tibia.  PROCEDURE:  Prophylactic antibiotics, satisfactory general anesthesia, Foley catheter was inserted.  Sure foot and lateral hip stabilizer as well as pneumatic tourniquet.  Left leg was prepped with DuraPrep from tourniquet to ankle and draped as a sterile field.  Time-out performed. The leg was Esmarch out sterilely and tourniquet was inflated to 350 mmHg.  IV employed.  Vertical midline incision with median parapatellar incision to open the joint.  The pes anserinus and medial collateral ligament were undermined off the tibia.  Patellar mechanism was freed up and patella everted.  Large osteophytes from the patella trimmed up with double-action rongeur as well as osteophytes from the femur.  Inflamed suprapatellar bursa was excised.  The patella was everted, the knee flexed and portions of both menisci, ACL and PCL complex were excised. I made a 5/16-inch drill hole in the distal femur followed by the canal finder and the axis slender for 5-degree valgus cut.  Because she had a substantial flexion contracture, I elected to take 12 mm around the usual 10 of the distal femur.  Because of the tightness and subluxation of the knee, I was unable to get the  cradle apparatus underneath the femur for sizing purposes initially and accordingly, I had made a leveling cut on the proximal tibia, which allowed me and placed the cradle and sized the femur at a 9.  Scribe lines were then placed on the cut surface of the femur followed by the cutting jig for anterior and posterior cuts and posterior and anterior chamfer rings.  I then returned to the tibia and sized it at a size 7 with the baseplate with my initial intramedullary drill hole made followed by the step-cut drill, canal finder and then I placed intramedullary rod and initially made a 4-mm cut off the depressed medial tibial plateau at 90 degrees. We then returned to the femur and placed a laminar spreader, trimming of bone and soft tissue from behind both femoral condyles and then placed the femoral guide for creating the notchplasty and the patellar groove. After completing this, I then went through a trial of reduction.  The femoral component fit nicely, but there was clearly not enough space in the femoral tibial interval.  So, I replaced the tibial cutting jig and took an additional 4 mm off the proximal tibia.  This allowed me to fit a 12-mm spacer nicely.  While the knee was in extension and with the spacer in place, I then used the external rod splitting the bimalleolar distance  and placed scribe line on the anterior tibia.  While the knee was still in extension, I then went and sized the patella at a 28 and then, we used the 10-mm recess cutting jig to create a 10-mm recessed holes.  I then placed the trial patella and trimmed away excess bone from around the perimeter.  Following this, I impacted and cut tibia and placed a 7-mm baseplate positioning it based on previous scribe lines and anchored it with 3 pins.  I then used the tripod apparatus to ream to a 7 cemented and then followed this with the hand reamers working up to 14 x 80 reaming of the tibia.  I felt that the tibial  extension stem was indicated because of the significant deformity of her knee as well as the fact that she was heavy.  After completing this reaming, we placed a trial prosthesis and found that it fit nicely in the tibia.  We then water picked the knee while the components were opened and the tibia in particular assembled.  The methacrylate was then mixed after water picking the knee and then glued in the individual components, but starting first with the tibia, impacting the tibial component and removing excess methyl methacrylate.  I then did the same for the femur and with the 12-mm spacer in place and the knee in extension, we then went ahead and glued in the patella holding it with a patellar holding clamp until methacrylate had hardened.  We then cleaned up the joint with small amounts of methacrylate being removed.  The 12-mm spacer seemed to be the ideal size.  Once again, irrigating the joint well and placed the size 7 12-mm posterior stabilized spacer and reduced the knee, which was nice and stable.  No lateral release was needed for the patella.  Hemovac was then placed and closure performed.  Tourniquet was released at 2 hours of tourniquet time.  There was a closure with interrupted #1 Vicryl in 2 layers in the quadriceps tendon and distally in the synovium and capsule tissue with combination of #1 and 2-0 Vicryl subcutaneous tissue, staples in the skin.  Betadine, Adaptic, and dry sterile dressing were applied.  At the time of this dictation, she was taken to the recovery room in a satisfactory condition with no known complications and minimal blood loss.  Also, It should be noted, I placed Gelfoam in the distal femoral hole and also injected the line with Marcaine with adrenaline.          ______________________________ Marlowe Kays, M.D.     JA/MEDQ  D:  11/22/2011  T:  11/23/2011  Job:  [id number]

## 2011-11-23 NOTE — Care Management Note (Signed)
    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     Action/Plan:   CM spoke with patient. Plans are for patient to retyrn to her home where spouse will be caregiver. She already has rw, cane and handicapped equipped BR   Anticipated DC Date:  11/25/2011   Anticipated DC Plan:  HOME W HOME HEALTH SERVICES  In-house referral  NA      DC Planning Services  CM consult      [facility] Choice  HOME HEALTH   Choice offered to / List presented to:  C-1 Patient   DME arranged  NA      DME agency  NA        Status of service:  In process, will continue to follow Medicare Important Message given?   (If response is "NO", the following Medicare IM given date fields will be blank) Date Medicare IM given:   Date Additional Medicare IM given:    Discharge Disposition:    Per UR Regulation:    If discussed at Long Length of Stay Meetings, dates discussed:    Comments:  11/23/2011 Raynelle Bring BSN CCM [phone] TCT adjuster- [facility] SETTING UP [facility] SWERVICES/MSG LEFT ON VOICE MAIL. AWAITING CALLBACK

## 2011-11-24 LAB — BASIC METABOLIC PANEL
BUN: 18 mg/dL (ref 6–23)
CO2: 27 mEq/L (ref 19–32)
Calcium: 8.4 mg/dL (ref 8.4–10.5)
Chloride: 102 mEq/L (ref 96–112)
Creatinine, Ser: 0.88 mg/dL (ref 0.50–1.10)
GFR calc Af Amer: 79 mL/min — ABNORMAL LOW (ref >90)
GFR calc non Af Amer: 68 mL/min — ABNORMAL LOW (ref >90)
Glucose, Bld: 115 mg/dL — ABNORMAL HIGH (ref 70–99)
Potassium: 4.1 mEq/L (ref 3.5–5.1)
Sodium: 136 mEq/L (ref 135–145)

## 2011-11-24 LAB — HEMOGLOBIN AND HEMATOCRIT, BLOOD
HCT: 27.7 % — ABNORMAL LOW (ref 36.0–46.0)
Hemoglobin: 8.5 g/dL — ABNORMAL LOW (ref 12.0–15.0)

## 2011-11-24 NOTE — Progress Notes (Signed)
Patient ID: Kathryn Olsen, female   DOB: 07-Jun-1950, 61 y.o.   MRN: [id number] hgb-8.5--will re ck 9/13

## 2011-11-24 NOTE — Progress Notes (Signed)
Physical Therapy Treatment Patient Details Name: Kathryn Olsen MRN: [id number] DOB: 07-May-1950 Today's Date: 11/24/2011 Time: 1115-1140 PT Time Calculation (min): 25 min  PT Assessment / Plan / Recommendation    Comments on Treatment Session  POD #2 am session.  Pt OOB in recliner. Amb in hallway with chair following behind as pt c/o increased weakness/fatigue.Hgb 8.5 down from 10.1     Follow Up Recommendations  Home health PT    Barriers to Discharge        Equipment Recommendations  None recommended by PT    Recommendations for Other Services    Frequency 7X/week   Plan Discharge plan remains appropriate    Precautions / Restrictions Precautions Precautions: Knee Precaution Comments: Pt instructed on KI use for amb  Required Braces or Orthoses: Knee Immobilizer - Left Knee Immobilizer - Left: Discontinue once straight leg raise with < 10 degree lag Restrictions Weight Bearing Restrictions: No Other Position/Activity Restrictions: WBAT    Pertinent Vitals/Pain C/o fatigue    Mobility  Bed Mobility Bed Mobility: Not assessed Details for Bed Mobility Assistance: Pt OOB in recliner  Transfers Transfers: Sit to Stand;Stand to Sit Sit to Stand: 4: Min assist;From chair/3-in-1 Stand to Sit: 4: Min assist;To chair/3-in-1 Details for Transfer Assistance: 25% VC's on proper tech and hand placement plus increased time Ambulation/Gait  Ambulation/Gait Assistance: 4: Min guard;4: Min Environmental consultant (Feet): 45 Feet Assistive device: Rolling walker Ambulation/Gait Assistance Details: 25% VC's on proper sequencing and increased time due to MAX c/o fatigue  Gait Pattern: Step-to pattern;Decreased stance time - left;Decreased stride length;Trunk flexed Gait velocity: decreased    PT Goals                       progressing    Visit Information  Last PT Received On: 11/24/11 Assistance Needed: +1             End of Session PT - End of  Session Equipment Utilized During Treatment: Gait belt;Left knee immobilizer Activity Tolerance: Patient limited by fatigue Patient left: in chair;with call bell/phone within reach Nurse Communication: Other (comment) (noted increased c/o fatigue)

## 2011-11-24 NOTE — Evaluation (Signed)
Occupational Therapy Evaluation Patient Details Name: Kathryn Olsen MRN: [id number] DOB: 1950-08-09 Today's Date: 11/24/2011 Time: 4098-1191 OT Time Calculation (min): 38 min  OT Assessment / Plan / Recommendation Clinical Impression  Pleasant and cooperative 61 yr old female admitted for elective LTKA.  Pt presents with slight increase in dependence for all basic and advanced ADLS.  Will benefit from acute care OT to help educated pt on techniques for greater independent performance of selfcare tasks.      OT Assessment  Patient needs continued OT Services    Follow Up Recommendations  No OT follow up       Equipment Recommendations  None recommended by PT       Frequency  Min 2X/week    Precautions / Restrictions Precautions Precautions: Knee Required Braces or Orthoses: Knee Immobilizer - Left Knee Immobilizer - Left: Discontinue once straight leg raise with < 10 degree lag   Pertinent Vitals/Pain Pain 6/10 in the right knee, meds given prior to session, applied ice and repositioned knee at end of session     ADL  Eating/Feeding: Simulated;Independent Where Assessed - Eating/Feeding: Chair Grooming: Simulated;Set up Where Assessed - Grooming: Supported sitting Upper Body Bathing: Simulated;Set up Where Assessed - Upper Body Bathing: Supported sitting Lower Body Bathing: Simulated;Moderate assistance Where Assessed - Lower Body Bathing: Supported sit to stand Upper Body Dressing: Simulated;Set up Where Assessed - Upper Body Dressing: Supported sit to stand Lower Body Dressing: Moderate assistance Where Assessed - Lower Body Dressing: Supported sit to stand Toilet Transfer: Mining engineer Method: Other (comment) (Ambulate with RW) Acupuncturist: Bedside commode Toileting - Clothing Manipulation and Hygiene: Simulated;Minimal assistance Where Assessed - Engineer, mining and Hygiene: Sit to stand from  3-in-1 or toilet;Standing Tub/Shower Transfer: Maximal assistance Tub/Shower Transfer Method: Ambulating Equipment Used: Rolling walker;Reacher;Sock aid;Knee Immobilizer Transfers/Ambulation Related to ADLs: Pt requires min assist for sit to stand from the bedside chair and for simulated transfers with the RW. ADL Comments: Pt with decreased ability to reach either foot for LB dressing and bathing.  Educated pt on AE use for LB selfcare as well.  Attempted simulated walk-in shower transfer but pt unable to support her weight over the LLE to step over the edge with the RLE.  Needs continued practice.    OT Diagnosis: Generalized weakness;Acute pain  OT Problem List: Decreased strength;Decreased activity tolerance;Impaired balance (sitting and/or standing);Pain;Decreased knowledge of use of DME or AE OT Treatment Interventions: Self-care/ADL training;Therapeutic activities;DME and/or AE instruction;Balance training;Patient/family education   OT Goals Acute Rehab OT Goals OT Goal Formulation: With patient ADL Goals Pt Will Perform Grooming: with supervision;Unsupported;Standing at sink ADL Goal: Grooming - Progress: Goal set today Pt Will Perform Lower Body Bathing: with supervision;Sit to stand from chair;Sit to stand from bed;with adaptive equipment ADL Goal: Lower Body Bathing - Progress: Goal set today Pt Will Perform Lower Body Dressing: with supervision;with adaptive equipment;Sit to stand from chair ADL Goal: Lower Body Dressing - Progress: Goal set today Pt Will Transfer to Toilet: with supervision;with DME;3-in-1 ADL Goal: Toilet Transfer - Progress: Goal set today Pt Will Perform Tub/Shower Transfer: with supervision;with DME;Ambulation;Other (comment) (stepping over edge of the shower) ADL Goal: Tub/Shower Transfer - Progress: Goal set today  Visit Information  Last OT Received On: 11/24/11 Assistance Needed: +1    Subjective Data  Subjective: "I have a handicapped  bathroom." Patient Stated Goal: To be able to get back her independence.   Prior Functioning  Vision/Perception  Home Living  Lives With: Spouse Available Help at Discharge: Family Type of Home: House Home Access: Stairs to enter [employer] of Steps: 2 Entrance Stairs-Rails: Can reach both;Left;Right Home Layout: Two level;Able to live on main level with bedroom/bathroom Alternate Level Stairs-Number of Steps: 14 Alternate Level Stairs-Rails: Right;Left;Can reach both Bathroom Shower/Tub: Health visitor: Handicapped height Home Adaptive Equipment: Engineer, drilling - four wheeled;Straight cane;Grab bars in shower Prior Function Level of Independence: Independent with assistive device(s) Able to Take Stairs?: Yes Driving: Yes Vocation: Full time employment Communication Communication: No difficulties   Vision - Assessment Vision Assessment: Vision not tested Perception Perception: Within Functional Limits Praxis Praxis: Intact  Cognition  Overall Cognitive Status: Appears within functional limits for tasks assessed/performed Arousal/Alertness: Awake/alert Orientation Level: Appears intact for tasks assessed Behavior During Session: [facility] for tasks performed    Extremity/Trunk Assessment Right Upper Extremity Assessment RUE ROM/Strength/Tone: Within functional levels Left Upper Extremity Assessment LUE ROM/Strength/Tone: Within functional levels Trunk Assessment Trunk Assessment: Normal   Mobility  Shoulder Instructions  Transfers Transfers: Sit to Stand Sit to Stand: 4: Min assist;Without upper extremity assist;From chair/3-in-1 Stand to Sit: 4: Min assist;To chair/3-in-1;With armrests        Balance Balance Balance Assessed: Yes Static Standing Balance Static Standing - Balance Support: Right upper extremity supported;Left upper extremity supported Static Standing - Level of Assistance: 5: Stand by assistance   End of  Session OT - End of Session Activity Tolerance: Patient limited by pain Patient left: in chair;with call bell/phone within reach Nurse Communication: Mobility status     Modene Andy OTR/L Pager number [phone] 11/24/2011, 11:35 AM

## 2011-11-24 NOTE — Progress Notes (Signed)
Patient ID: Kathryn Olsen, female   DOB: 10/01/1950, 61 y.o.   MRN: [id number] POD 2--dressing changed.  Wound healing nicely.  hgb pending.  Still requires some iv pain med.

## 2011-11-24 NOTE — Progress Notes (Signed)
Physical Therapy Treatment Patient Details Name: Kathryn Olsen MRN: [id number] DOB: 12-02-1950 Today's Date: 11/24/2011 Time: 1510-1530 PT Time Calculation (min): 20 min  PT Assessment / Plan / Recommendation Comments on Treatment Session  POD #2 pm session.  Pt back in bed with c/o increased L knee "tightness" and pain despite being pre medicated for session.  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  Repositioned to comfort.  Pt progressing slowly.    Follow Up Recommendations  Home health PT    Barriers to Discharge        Equipment Recommendations  None recommended by PT    Recommendations for Other Services    Frequency 7X/week   Plan Discharge plan remains appropriate    Precautions / Restrictions Precautions Precautions: Knee Precaution Comments: Pt instructed on KI use for amb  Required Braces or Orthoses: Knee Immobilizer - Left Knee Immobilizer - Left: Discontinue once straight leg raise with < 10 degree lag Restrictions Weight Bearing Restrictions: No Other Position/Activity Restrictions: WBAT    Pertinent Vitals/Pain C/o 8/10 during TE's Repositioned Muscle relaxer requested       Exercises Total Joint Exercises Ankle Circles/Pumps: AROM;Both;10 reps;Supine Quad Sets: AROM;Both;10 reps;Supine Gluteal Sets: AROM;Both;10 reps;Supine Towel Squeeze: AROM;Both;10 reps;Supine Short Arc Quad: AAROM;Left;5 reps;Supine Heel Slides: AAROM;Left;10 reps;Supine Hip ABduction/ADduction: AAROM;Left;10 reps;Supine Straight Leg Raises: AAROM;Left;10 reps;Supine    PT Goals      Progressing slowly    Visit Information  Last PT Received On: 11/24/11 Assistance Needed: +1                   End of Session PT - End of Session Equipment Utilized During Treatment: Gait belt;Left knee immobilizer Activity Tolerance: Patient limited by fatigue Patient left: in chair;with call bell/phone within reach Nurse  Communication: Other (comment) (noted increased c/o fatigue)   Felecia Shelling  PTA [facility]  Acute  Rehab Pager     [phone]

## 2011-11-25 LAB — CBC
HCT: 27.7 % — ABNORMAL LOW (ref 36.0–46.0)
Hemoglobin: 8.5 g/dL — ABNORMAL LOW (ref 12.0–15.0)
MCH: 26 pg (ref 26.0–34.0)
MCHC: 30.7 g/dL (ref 30.0–36.0)
MCV: 84.7 fL (ref 78.0–100.0)
Platelets: 226 10*3/uL (ref 150–400)
RBC: 3.27 MIL/uL — ABNORMAL LOW (ref 3.87–5.11)
RDW: 15 % (ref 11.5–15.5)
WBC: 11.6 10*3/uL — ABNORMAL HIGH (ref 4.0–10.5)

## 2011-11-25 LAB — BASIC METABOLIC PANEL
BUN: 20 mg/dL (ref 6–23)
CO2: 28 mEq/L (ref 19–32)
Calcium: 8.3 mg/dL — ABNORMAL LOW (ref 8.4–10.5)
Chloride: 102 mEq/L (ref 96–112)
Creatinine, Ser: 0.91 mg/dL (ref 0.50–1.10)
GFR calc Af Amer: 76 mL/min — ABNORMAL LOW (ref >90)
GFR calc non Af Amer: 66 mL/min — ABNORMAL LOW (ref >90)
Glucose, Bld: 113 mg/dL — ABNORMAL HIGH (ref 70–99)
Potassium: 4.1 mEq/L (ref 3.5–5.1)
Sodium: 135 mEq/L (ref 135–145)

## 2011-11-25 MED ORDER — OXYCODONE-ACETAMINOPHEN 7.5-325 MG PO TABS: 1.0000 | ORAL_TABLET | ORAL | Status: AC | PRN

## 2011-11-25 MED ORDER — RIVAROXABAN 10 MG PO TABS: 10.0000 mg | ORAL_TABLET | Freq: Every day | ORAL | Status: DC

## 2011-11-25 MED ORDER — METHOCARBAMOL 500 MG PO TABS: 500.0000 mg | ORAL_TABLET | Freq: Four times a day (QID) | ORAL | Status: AC

## 2011-11-25 NOTE — Progress Notes (Signed)
CURRENTLY UNABLE TO COPY AND PASTE FROM [facility]  WILL PROVIDE HHPT SERVICES FOR PATIENT WITH START DATE OF Monday 11/28/2011. PATIENT WAS ADVISED. ANTICIPATE DISCHARGE TODAY.

## 2011-11-25 NOTE — Progress Notes (Signed)
Physical Therapy Treatment Patient Details Name: Kathryn Olsen MRN: [id number] DOB: 1950-12-16 Today's Date: 11/25/2011 Time: 4098-1191 PT Time Calculation (min): 40 min  PT Assessment / Plan / Recommendation Comments on Treatment Session  POD #3 am session.  Pt states she feels alot better today.  Amb in halwway and practiced stairs using one rail and one crutch with spouse present.  performed all supine TKR TE's then applied ICE.  Pt plans to D/C to home with spouse.  Crutches sized and issued for pt to take home for use with stairs.    Follow Up Recommendations  Home health PT    Barriers to Discharge        Equipment Recommendations  None recommended by PT    Recommendations for Other Services    Frequency 7X/week   Plan Discharge plan remains appropriate    Precautions / Restrictions Precautions Precautions: Knee Precaution Comments: Pt instructed on KI use for amb Required Braces or Orthoses: Knee Immobilizer - Left Knee Immobilizer - Left: Discontinue once straight leg raise with < 10 degree lag Restrictions Weight Bearing Restrictions: No Other Position/Activity Restrictions: WBAT    Pertinent Vitals/Pain C/o 7/10 L knee pain during TE's ICE applied    Mobility  Bed Mobility Bed Mobility: Not assessed Details for Bed Mobility Assistance: Pt OOB in recliner  Transfers Transfers: Sit to Stand;Stand to Sit Sit to Stand: 4: Min guard;From chair/3-in-1 Stand to Sit: 4: Min guard;To chair/3-in-1 Details for Transfer Assistance: 25% VC's on proper tech and hand placement plus increased time   Ambulation/Gait Ambulation/Gait Assistance: 4: Min guard Ambulation Distance (Feet): 65 Feet Assistive device: Rolling walker Ambulation/Gait Assistance Details: <25% VC's on safety with turns and backward gait Gait Pattern: Step-to pattern;Decreased stance time - left Gait velocity: decreased   Stairs With spouse present, performed stair training using one R  rail and one crutch with 25% VC's on proper tech and safety   Exercises Total Joint Exercises Ankle Circles/Pumps: AROM;Both;10 reps;Supine Quad Sets: AROM;Both;10 reps;Supine Gluteal Sets: AROM;Both;10 reps;Supine Towel Squeeze: AROM;Both;10 reps;Supine Heel Slides: AAROM;Left;10 reps;Supine Hip ABduction/ADduction: AAROM;Left;10 reps;Supine Straight Leg Raises: AAROM;Left;10 reps;Supine    PT Goals                         progressing    Visit Information  Last PT Received On: 11/25/11 Assistance Needed: +1                   End of Session PT - End of Session Equipment Utilized During Treatment: Gait belt;Left knee immobilizer Activity Tolerance: Patient tolerated treatment well Patient left: in chair;with call bell/phone within reach;Other (comment) (ICE to L knee)   Felecia Shelling  PTA [facility]  Acute  Rehab Pager     [phone]

## 2011-11-25 NOTE — Progress Notes (Signed)
Physical Therapy Treatment Patient Details Name: CHARISSE WENDELL MRN: [id number] DOB: November 24, 1950 Today's Date: 11/25/2011 Time: 1445-1510 PT Time Calculation (min): 25 min  PT Assessment / Plan / Recommendation Comments on Treatment Session  POD #3 pm session.  Pt clear for D/C to home this afternoon.  Amb in hallway then assisted back to bed to rest until spouse arrives to take her home.  Applied ICE to L knee.    Follow Up Recommendations  Home health PT    Barriers to Discharge        Equipment Recommendations  None recommended by PT    Recommendations for Other Services    Frequency 7X/week   Plan Discharge plan remains appropriate    Precautions / Restrictions Precautions Precautions: Knee Precaution Comments: Pt aware of KI use and when to D/C Required Braces or Orthoses: Knee Immobilizer - Left Knee Immobilizer - Left: Discontinue once straight leg raise with < 10 degree lag Restrictions Weight Bearing Restrictions: No Other Position/Activity Restrictions: WBAT    Pertinent Vitals/Pain C/o 5/10 L knee pain and "tightness" ICE applied    Mobility  Bed Mobility Bed Mobility: Not assessed Sit to Supine: 3: Mod assist Details for Bed Mobility Assistance: Assisted pt back to bed with Mod assist to support L LE  Transfers Transfers: Sit to Stand;Stand to Sit Sit to Stand: 5: Supervision;4: Min guard;From chair/3-in-1 Stand to Sit: 5: Supervision;4: Min guard;To bed Details for Transfer Assistance: increased time and one VC on safety with turns  Ambulation/Gait Ambulation/Gait Assistance: 5: Supervision;4: Min guard Ambulation Distance (Feet): 85 Feet Assistive device: Rolling walker Ambulation/Gait Assistance Details: increased time one VC on safety with backward gait Gait Pattern: Step-to pattern;Decreased stance time - left Gait velocity: decreased       PT Goals                   progressing    Visit Information  Last PT Received On:  11/25/11 Assistance Needed: +1    Subjective Data      Cognition       Balance     End of Session PT - End of Session Equipment Utilized During Treatment: Gait belt;Left knee immobilizer Activity Tolerance: Patient tolerated treatment well Patient left: in bed;with call bell/phone within reach   Felecia Shelling  PTA [facility]  Acute  Rehab Pager     [phone]

## 2011-11-25 NOTE — Progress Notes (Signed)
Patient ID: Kathryn Olsen, female   DOB: [birth year], 61 y.o.   MRN: [id number] Afrbrile. Comfortable on po analgesics. Will recheck hgb this am and if ok discharge on Xarelto.

## 2011-11-25 NOTE — Progress Notes (Signed)
11/24/2011 Raynelle Bring bsn ccm [phone] TCT adjuster -laura states she will set up [facility] services. HHpt orders faxed to [phone] with successful confirmation.

## 2011-11-25 NOTE — Progress Notes (Signed)
Pt for D/C home today with [facility] PT per Workman's Comp. IV d/c'd Dressing CDI to L knee. Dressing supplies provided for home use. No change in Hgb from yesterday-still at 8.5 & pt remains asymptomatic. Husband will pick up pt after work.

## 2011-11-25 NOTE — Progress Notes (Signed)
Occupational Therapy Treatment Patient Details Name: Kathryn Olsen MRN: [id number] DOB: [birth year] Today's Date: 11/25/2011 Time: 7829-5621 OT Time Calculation (min): 35 min  OT Assessment / Plan / Recommendation Comments on Treatment Session      Follow Up Recommendations  No OT follow up    Barriers to Discharge       Equipment Recommendations  None recommended by PT    Recommendations for Other Services    Frequency     Plan      Precautions / Restrictions Precautions Precautions: Knee Knee Immobilizer - Left: Discontinue once straight leg raise with < 10 degree lag Restrictions Other Position/Activity Restrictions: WBAT   Pertinent Vitals/Pain Pain moderate L lower leg:  Repositioned and ice applied    ADL  Grooming: Performed;Supervision/safety;Wash/dry face;Teeth care Where Assessed - Grooming: unsupported standing Lower Body Dressing: Performed;Minimal assistance (underwear) Where Assessed - Lower Body Dressing: Supported sit to stand Toilet Transfer: Research scientist (life sciences) Method: Sit to Barista: Raised toilet seat with arms (or 3-in-1 over toilet) Toileting - Clothing Manipulation and Hygiene: Performed;Supervision/safety Where Assessed - Toileting Clothing Manipulation and Hygiene: Sit to stand from 3-in-1 or toilet Transfers/Ambulation Related to ADLs: ambulated into bathroom with supervision.  Pt can now lift RLE but wants to wait on shower transfer until she is home.  Stood at sink for adls ADL Comments: donned underwear with min A.  Pt has reacher at home:  explained use of this to help.  Husband will don socks for her    OT Diagnosis:    OT Problem List:   OT Treatment Interventions:     OT Goals Acute Rehab OT Goals Time For Goal Achievement: 12/01/11 ADL Goals Pt Will Perform Grooming: with supervision;Unsupported;Standing at sink ADL Goal: Grooming - Progress: Met Pt Will Perform Lower Body  Dressing: with supervision;with adaptive equipment;Sit to stand from chair ADL Goal: Lower Body Dressing - Progress: Progressing toward goals Pt Will Transfer to Toilet: with supervision;with DME;3-in-1 ADL Goal: Toilet Transfer - Progress: Met  Visit Information  Last OT Received On: 11/25/11 Assistance Needed: +1    Subjective Data      Prior Functioning       Cognition  Overall Cognitive Status: Appears within functional limits for tasks assessed/performed Arousal/Alertness: Awake/alert Orientation Level: Appears intact for tasks assessed Behavior During Session: [facility] for tasks performed    Mobility  Shoulder Instructions Bed Mobility Supine to Sit: 4: Min assist Transfers Sit to Stand: 5: Supervision       Exercises      Balance     End of Session OT - End of Session Activity Tolerance: Patient tolerated treatment well Patient left: in chair;with call bell/phone within reach  GO     Mathias Bogacki 11/25/2011, 9:13 AM Marica Otter, OTR/L [phone] 11/25/2011

## 2011-11-28 NOTE — Discharge Summary (Signed)
NAMEJANNICE, [name]       ACCOUNT NO.:  [id number]  MEDICAL RECORD NO.:  [id number]  LOCATION:  1602                         FACILITY:  [facility]  PHYSICIAN:  Marlowe Kays, M.D.  DATE OF BIRTH:  September 09, 1950  DATE OF ADMISSION:  11/22/2011 DATE OF DISCHARGE:  11/25/2011                              DISCHARGE SUMMARY   ADMISSION DIAGNOSIS:  End-stage osteoarthritis, left knee.  DISCHARGE DIAGNOSIS:  End-stage osteoarthritis, left knee.  OPERATION:  Total knee arthroplasty, left, on November 22, 2011.  SUMMARY:  I have followed this woman for an extended period of time because of progressive pain and deformity in her left knee.  This proved refractory to nonsurgical treatment.  Accordingly, she is here for the above-mentioned surgical procedure.  Preoperative lab work was unremarkable.  Postop course also was unremarkable.  Her hemoglobin dropped to 8.5, but was stable at this point and at the time of discharge.  At discharge, her incision was healing nicely.  She was afebrile.  Postoperative x-rays look excellent.  Home health arrangements were being made, and wound care instructions at home given. She was discharged on Norco 7.5/325 and Robaxin as well as Xarelto 10 mg daily for 12 days.  Plan was to recheck her in my office 2 weeks from surgery with the understanding that if there are any questions, to call. She also was discharged on her preadmission home medicines.  CONDITION AT DISCHARGE:  Stable.          ______________________________ Marlowe Kays, M.D.     JA/MEDQ  D:  11/28/2011  T:  11/28/2011  Job:  [id number]

## 2011-12-22 ENCOUNTER — Ambulatory Visit: Payer: Worker's Compensation | Attending: Orthopedic Surgery | Admitting: Physical Therapy

## 2011-12-22 DIAGNOSIS — R262 Difficulty in walking, not elsewhere classified: Secondary | ICD-10-CM | POA: Insufficient documentation

## 2011-12-22 DIAGNOSIS — Z96659 Presence of unspecified artificial knee joint: Secondary | ICD-10-CM | POA: Insufficient documentation

## 2011-12-22 DIAGNOSIS — M25569 Pain in unspecified knee: Secondary | ICD-10-CM | POA: Insufficient documentation

## 2011-12-22 DIAGNOSIS — IMO0001 Reserved for inherently not codable concepts without codable children: Secondary | ICD-10-CM | POA: Insufficient documentation

## 2011-12-22 DIAGNOSIS — M25669 Stiffness of unspecified knee, not elsewhere classified: Secondary | ICD-10-CM | POA: Insufficient documentation

## 2011-12-27 ENCOUNTER — Ambulatory Visit: Payer: Worker's Compensation | Attending: Orthopedic Surgery | Admitting: Physical Therapy

## 2011-12-27 DIAGNOSIS — IMO0001 Reserved for inherently not codable concepts without codable children: Secondary | ICD-10-CM | POA: Insufficient documentation

## 2011-12-27 DIAGNOSIS — R262 Difficulty in walking, not elsewhere classified: Secondary | ICD-10-CM | POA: Insufficient documentation

## 2011-12-27 DIAGNOSIS — M25669 Stiffness of unspecified knee, not elsewhere classified: Secondary | ICD-10-CM | POA: Insufficient documentation

## 2011-12-27 DIAGNOSIS — M25569 Pain in unspecified knee: Secondary | ICD-10-CM | POA: Insufficient documentation

## 2011-12-27 DIAGNOSIS — Z96659 Presence of unspecified artificial knee joint: Secondary | ICD-10-CM | POA: Insufficient documentation

## 2011-12-29 ENCOUNTER — Ambulatory Visit: Payer: Worker's Compensation | Attending: Orthopedic Surgery | Admitting: Physical Therapy

## 2011-12-29 DIAGNOSIS — R262 Difficulty in walking, not elsewhere classified: Secondary | ICD-10-CM | POA: Insufficient documentation

## 2011-12-29 DIAGNOSIS — M25669 Stiffness of unspecified knee, not elsewhere classified: Secondary | ICD-10-CM | POA: Insufficient documentation

## 2011-12-29 DIAGNOSIS — IMO0001 Reserved for inherently not codable concepts without codable children: Secondary | ICD-10-CM | POA: Insufficient documentation

## 2011-12-29 DIAGNOSIS — Z96659 Presence of unspecified artificial knee joint: Secondary | ICD-10-CM | POA: Insufficient documentation

## 2011-12-29 DIAGNOSIS — M25569 Pain in unspecified knee: Secondary | ICD-10-CM | POA: Insufficient documentation

## 2012-01-03 ENCOUNTER — Ambulatory Visit: Payer: Worker's Compensation | Attending: Orthopedic Surgery | Admitting: Physical Therapy

## 2012-01-03 DIAGNOSIS — IMO0001 Reserved for inherently not codable concepts without codable children: Secondary | ICD-10-CM | POA: Insufficient documentation

## 2012-01-03 DIAGNOSIS — M25569 Pain in unspecified knee: Secondary | ICD-10-CM | POA: Insufficient documentation

## 2012-01-03 DIAGNOSIS — Z96659 Presence of unspecified artificial knee joint: Secondary | ICD-10-CM | POA: Insufficient documentation

## 2012-01-03 DIAGNOSIS — R262 Difficulty in walking, not elsewhere classified: Secondary | ICD-10-CM | POA: Insufficient documentation

## 2012-01-03 DIAGNOSIS — M25669 Stiffness of unspecified knee, not elsewhere classified: Secondary | ICD-10-CM | POA: Insufficient documentation

## 2012-01-05 ENCOUNTER — Ambulatory Visit: Payer: Worker's Compensation | Attending: Orthopedic Surgery | Admitting: Physical Therapy

## 2012-01-05 DIAGNOSIS — Z713 Dietary counseling and surveillance: Secondary | ICD-10-CM | POA: Insufficient documentation

## 2012-01-05 DIAGNOSIS — R262 Difficulty in walking, not elsewhere classified: Secondary | ICD-10-CM | POA: Insufficient documentation

## 2012-01-05 DIAGNOSIS — M25569 Pain in unspecified knee: Secondary | ICD-10-CM | POA: Insufficient documentation

## 2012-01-05 DIAGNOSIS — M25669 Stiffness of unspecified knee, not elsewhere classified: Secondary | ICD-10-CM | POA: Insufficient documentation

## 2012-01-05 DIAGNOSIS — IMO0001 Reserved for inherently not codable concepts without codable children: Secondary | ICD-10-CM | POA: Insufficient documentation

## 2012-01-10 ENCOUNTER — Ambulatory Visit: Payer: Worker's Compensation | Attending: Orthopedic Surgery | Admitting: Physical Therapy

## 2012-01-10 DIAGNOSIS — M25669 Stiffness of unspecified knee, not elsewhere classified: Secondary | ICD-10-CM | POA: Insufficient documentation

## 2012-01-10 DIAGNOSIS — R262 Difficulty in walking, not elsewhere classified: Secondary | ICD-10-CM | POA: Insufficient documentation

## 2012-01-10 DIAGNOSIS — Z96659 Presence of unspecified artificial knee joint: Secondary | ICD-10-CM | POA: Insufficient documentation

## 2012-01-10 DIAGNOSIS — M25569 Pain in unspecified knee: Secondary | ICD-10-CM | POA: Insufficient documentation

## 2012-01-10 DIAGNOSIS — IMO0001 Reserved for inherently not codable concepts without codable children: Secondary | ICD-10-CM | POA: Insufficient documentation

## 2012-01-12 ENCOUNTER — Ambulatory Visit: Payer: Worker's Compensation | Attending: Orthopedic Surgery | Admitting: Physical Therapy

## 2012-01-12 DIAGNOSIS — M25669 Stiffness of unspecified knee, not elsewhere classified: Secondary | ICD-10-CM | POA: Insufficient documentation

## 2012-01-12 DIAGNOSIS — M25569 Pain in unspecified knee: Secondary | ICD-10-CM | POA: Insufficient documentation

## 2012-01-12 DIAGNOSIS — R262 Difficulty in walking, not elsewhere classified: Secondary | ICD-10-CM | POA: Insufficient documentation

## 2012-01-12 DIAGNOSIS — IMO0001 Reserved for inherently not codable concepts without codable children: Secondary | ICD-10-CM | POA: Insufficient documentation

## 2012-01-12 DIAGNOSIS — Z713 Dietary counseling and surveillance: Secondary | ICD-10-CM | POA: Insufficient documentation

## 2012-01-17 ENCOUNTER — Ambulatory Visit: Payer: Worker's Compensation | Attending: Orthopedic Surgery | Admitting: Physical Therapy

## 2012-01-17 DIAGNOSIS — M25569 Pain in unspecified knee: Secondary | ICD-10-CM | POA: Insufficient documentation

## 2012-01-17 DIAGNOSIS — M25669 Stiffness of unspecified knee, not elsewhere classified: Secondary | ICD-10-CM | POA: Insufficient documentation

## 2012-01-17 DIAGNOSIS — IMO0001 Reserved for inherently not codable concepts without codable children: Secondary | ICD-10-CM | POA: Insufficient documentation

## 2012-01-17 DIAGNOSIS — R262 Difficulty in walking, not elsewhere classified: Secondary | ICD-10-CM | POA: Insufficient documentation

## 2012-01-20 ENCOUNTER — Ambulatory Visit: Payer: Worker's Compensation | Attending: Orthopedic Surgery | Admitting: Physical Therapy

## 2012-01-20 DIAGNOSIS — M25669 Stiffness of unspecified knee, not elsewhere classified: Secondary | ICD-10-CM | POA: Insufficient documentation

## 2012-01-20 DIAGNOSIS — IMO0001 Reserved for inherently not codable concepts without codable children: Secondary | ICD-10-CM | POA: Insufficient documentation

## 2012-01-20 DIAGNOSIS — M25569 Pain in unspecified knee: Secondary | ICD-10-CM | POA: Insufficient documentation

## 2012-01-20 DIAGNOSIS — R262 Difficulty in walking, not elsewhere classified: Secondary | ICD-10-CM | POA: Insufficient documentation

## 2012-01-24 ENCOUNTER — Ambulatory Visit: Payer: Worker's Compensation | Attending: Orthopedic Surgery | Admitting: Physical Therapy

## 2012-01-24 DIAGNOSIS — IMO0001 Reserved for inherently not codable concepts without codable children: Secondary | ICD-10-CM | POA: Insufficient documentation

## 2012-01-24 DIAGNOSIS — M25569 Pain in unspecified knee: Secondary | ICD-10-CM | POA: Insufficient documentation

## 2012-01-24 DIAGNOSIS — M25669 Stiffness of unspecified knee, not elsewhere classified: Secondary | ICD-10-CM | POA: Insufficient documentation

## 2012-01-24 DIAGNOSIS — R262 Difficulty in walking, not elsewhere classified: Secondary | ICD-10-CM | POA: Insufficient documentation

## 2012-01-26 ENCOUNTER — Ambulatory Visit: Payer: Worker's Compensation | Admitting: Physical Therapy

## 2012-01-31 ENCOUNTER — Ambulatory Visit: Payer: Worker's Compensation | Attending: Orthopedic Surgery | Admitting: Physical Therapy

## 2012-01-31 DIAGNOSIS — M25669 Stiffness of unspecified knee, not elsewhere classified: Secondary | ICD-10-CM | POA: Insufficient documentation

## 2012-01-31 DIAGNOSIS — IMO0001 Reserved for inherently not codable concepts without codable children: Secondary | ICD-10-CM | POA: Insufficient documentation

## 2012-01-31 DIAGNOSIS — R262 Difficulty in walking, not elsewhere classified: Secondary | ICD-10-CM | POA: Insufficient documentation

## 2012-01-31 DIAGNOSIS — M25569 Pain in unspecified knee: Secondary | ICD-10-CM | POA: Insufficient documentation

## 2012-02-02 ENCOUNTER — Ambulatory Visit: Payer: Worker's Compensation | Attending: Orthopedic Surgery | Admitting: Physical Therapy

## 2012-02-02 DIAGNOSIS — R262 Difficulty in walking, not elsewhere classified: Secondary | ICD-10-CM | POA: Insufficient documentation

## 2012-02-02 DIAGNOSIS — M25569 Pain in unspecified knee: Secondary | ICD-10-CM | POA: Insufficient documentation

## 2012-02-02 DIAGNOSIS — IMO0001 Reserved for inherently not codable concepts without codable children: Secondary | ICD-10-CM | POA: Insufficient documentation

## 2012-02-02 DIAGNOSIS — M25669 Stiffness of unspecified knee, not elsewhere classified: Secondary | ICD-10-CM | POA: Insufficient documentation

## 2012-02-03 ENCOUNTER — Other Ambulatory Visit (HOSPITAL_COMMUNITY): Payer: Self-pay | Admitting: Orthopedic Surgery

## 2012-02-03 ENCOUNTER — Ambulatory Visit (HOSPITAL_COMMUNITY)
Admission: RE | Admit: 2012-02-03 | Discharge: 2012-02-03 | Disposition: A | Discharge status: Home / Self Care | Payer: Worker's Compensation | Source: Ambulatory Visit | Attending: Orthopedic Surgery | Admitting: Orthopedic Surgery

## 2012-02-03 DIAGNOSIS — M79606 Pain in leg, unspecified: Secondary | ICD-10-CM

## 2012-02-03 DIAGNOSIS — M79609 Pain in unspecified limb: Secondary | ICD-10-CM

## 2012-02-03 DIAGNOSIS — M7989 Other specified soft tissue disorders: Secondary | ICD-10-CM

## 2012-02-03 NOTE — Progress Notes (Signed)
*  Preliminary Results* Left lower extremity venous duplex completed. Left lower extremity is negative for deep vein thrombosis. No evidence of left Baker's cyst. Preliminary results discussed with Robin of Dr. Leim Fabry office.  02/03/2012 11:44 AM Gertie Fey, RDMS, RDCS

## 2012-02-06 ENCOUNTER — Ambulatory Visit: Payer: Worker's Compensation | Admitting: Physical Therapy

## 2012-02-06 ENCOUNTER — Encounter: Payer: Self-pay | Admitting: Physical Therapy

## 2012-02-06 ENCOUNTER — Encounter (HOSPITAL_COMMUNITY): Payer: Self-pay

## 2012-02-07 ENCOUNTER — Ambulatory Visit: Payer: Worker's Compensation | Attending: Orthopedic Surgery | Admitting: Physical Therapy

## 2012-02-16 ENCOUNTER — Ambulatory Visit: Payer: Worker's Compensation | Attending: Orthopedic Surgery | Admitting: Physical Therapy

## 2012-02-16 DIAGNOSIS — M25669 Stiffness of unspecified knee, not elsewhere classified: Secondary | ICD-10-CM | POA: Insufficient documentation

## 2012-02-16 DIAGNOSIS — Z96659 Presence of unspecified artificial knee joint: Secondary | ICD-10-CM | POA: Insufficient documentation

## 2012-02-16 DIAGNOSIS — M25569 Pain in unspecified knee: Secondary | ICD-10-CM | POA: Insufficient documentation

## 2012-02-16 DIAGNOSIS — R262 Difficulty in walking, not elsewhere classified: Secondary | ICD-10-CM | POA: Insufficient documentation

## 2012-02-16 DIAGNOSIS — IMO0001 Reserved for inherently not codable concepts without codable children: Secondary | ICD-10-CM | POA: Insufficient documentation

## 2012-02-21 ENCOUNTER — Ambulatory Visit: Payer: Worker's Compensation | Admitting: Physical Therapy

## 2012-02-22 ENCOUNTER — Ambulatory Visit: Payer: Worker's Compensation | Attending: Orthopedic Surgery | Admitting: Physical Therapy

## 2012-02-24 ENCOUNTER — Ambulatory Visit: Payer: Worker's Compensation | Attending: Orthopedic Surgery | Admitting: Physical Therapy

## 2012-02-24 DIAGNOSIS — IMO0001 Reserved for inherently not codable concepts without codable children: Secondary | ICD-10-CM | POA: Insufficient documentation

## 2012-02-24 DIAGNOSIS — R262 Difficulty in walking, not elsewhere classified: Secondary | ICD-10-CM | POA: Insufficient documentation

## 2012-02-24 DIAGNOSIS — M25669 Stiffness of unspecified knee, not elsewhere classified: Secondary | ICD-10-CM | POA: Insufficient documentation

## 2012-02-24 DIAGNOSIS — M25569 Pain in unspecified knee: Secondary | ICD-10-CM | POA: Insufficient documentation

## 2012-02-24 DIAGNOSIS — Z96659 Presence of unspecified artificial knee joint: Secondary | ICD-10-CM | POA: Insufficient documentation

## 2012-03-01 ENCOUNTER — Ambulatory Visit: Payer: Worker's Compensation | Attending: Orthopedic Surgery | Admitting: Physical Therapy

## 2012-03-02 ENCOUNTER — Ambulatory Visit: Payer: Worker's Compensation | Attending: Orthopedic Surgery | Admitting: Physical Therapy

## 2012-03-15 ENCOUNTER — Ambulatory Visit: Payer: Worker's Compensation | Attending: Orthopedic Surgery | Admitting: Physical Therapy

## 2012-03-15 DIAGNOSIS — M25669 Stiffness of unspecified knee, not elsewhere classified: Secondary | ICD-10-CM | POA: Insufficient documentation

## 2012-03-15 DIAGNOSIS — Z96659 Presence of unspecified artificial knee joint: Secondary | ICD-10-CM | POA: Insufficient documentation

## 2012-03-15 DIAGNOSIS — IMO0001 Reserved for inherently not codable concepts without codable children: Secondary | ICD-10-CM | POA: Insufficient documentation

## 2012-03-15 DIAGNOSIS — M25569 Pain in unspecified knee: Secondary | ICD-10-CM | POA: Insufficient documentation

## 2012-03-15 DIAGNOSIS — R262 Difficulty in walking, not elsewhere classified: Secondary | ICD-10-CM | POA: Insufficient documentation

## 2012-03-16 ENCOUNTER — Ambulatory Visit: Payer: Worker's Compensation | Attending: Orthopedic Surgery | Admitting: Physical Therapy

## 2012-03-16 DIAGNOSIS — R262 Difficulty in walking, not elsewhere classified: Secondary | ICD-10-CM | POA: Insufficient documentation

## 2012-03-16 DIAGNOSIS — M25669 Stiffness of unspecified knee, not elsewhere classified: Secondary | ICD-10-CM | POA: Insufficient documentation

## 2012-03-16 DIAGNOSIS — IMO0001 Reserved for inherently not codable concepts without codable children: Secondary | ICD-10-CM | POA: Insufficient documentation

## 2012-03-16 DIAGNOSIS — M25569 Pain in unspecified knee: Secondary | ICD-10-CM | POA: Insufficient documentation

## 2012-03-16 DIAGNOSIS — Z96659 Presence of unspecified artificial knee joint: Secondary | ICD-10-CM | POA: Insufficient documentation

## 2012-03-20 ENCOUNTER — Ambulatory Visit: Payer: Worker's Compensation | Attending: Orthopedic Surgery | Admitting: Physical Therapy

## 2012-03-20 DIAGNOSIS — R262 Difficulty in walking, not elsewhere classified: Secondary | ICD-10-CM | POA: Insufficient documentation

## 2012-03-20 DIAGNOSIS — M25569 Pain in unspecified knee: Secondary | ICD-10-CM | POA: Insufficient documentation

## 2012-03-20 DIAGNOSIS — IMO0001 Reserved for inherently not codable concepts without codable children: Secondary | ICD-10-CM | POA: Insufficient documentation

## 2012-03-20 DIAGNOSIS — Z96659 Presence of unspecified artificial knee joint: Secondary | ICD-10-CM | POA: Insufficient documentation

## 2012-03-20 DIAGNOSIS — M25669 Stiffness of unspecified knee, not elsewhere classified: Secondary | ICD-10-CM | POA: Insufficient documentation

## 2012-03-22 ENCOUNTER — Ambulatory Visit: Payer: Worker's Compensation | Attending: Orthopedic Surgery | Admitting: Physical Therapy

## 2012-03-22 DIAGNOSIS — Z96659 Presence of unspecified artificial knee joint: Secondary | ICD-10-CM | POA: Insufficient documentation

## 2012-03-22 DIAGNOSIS — R262 Difficulty in walking, not elsewhere classified: Secondary | ICD-10-CM | POA: Insufficient documentation

## 2012-03-22 DIAGNOSIS — M25669 Stiffness of unspecified knee, not elsewhere classified: Secondary | ICD-10-CM | POA: Insufficient documentation

## 2012-03-22 DIAGNOSIS — IMO0001 Reserved for inherently not codable concepts without codable children: Secondary | ICD-10-CM | POA: Insufficient documentation

## 2012-03-22 DIAGNOSIS — M25569 Pain in unspecified knee: Secondary | ICD-10-CM | POA: Insufficient documentation

## 2012-03-27 ENCOUNTER — Ambulatory Visit: Payer: Worker's Compensation | Attending: Orthopedic Surgery | Admitting: Physical Therapy

## 2012-03-27 DIAGNOSIS — M25569 Pain in unspecified knee: Secondary | ICD-10-CM | POA: Insufficient documentation

## 2012-03-27 DIAGNOSIS — M25669 Stiffness of unspecified knee, not elsewhere classified: Secondary | ICD-10-CM | POA: Insufficient documentation

## 2012-03-27 DIAGNOSIS — R262 Difficulty in walking, not elsewhere classified: Secondary | ICD-10-CM | POA: Insufficient documentation

## 2012-03-27 DIAGNOSIS — IMO0001 Reserved for inherently not codable concepts without codable children: Secondary | ICD-10-CM | POA: Insufficient documentation

## 2012-03-27 DIAGNOSIS — Z96659 Presence of unspecified artificial knee joint: Secondary | ICD-10-CM | POA: Insufficient documentation

## 2012-03-29 ENCOUNTER — Ambulatory Visit: Payer: Worker's Compensation | Attending: Orthopedic Surgery | Admitting: Physical Therapy

## 2012-03-29 DIAGNOSIS — M25569 Pain in unspecified knee: Secondary | ICD-10-CM | POA: Insufficient documentation

## 2012-03-29 DIAGNOSIS — IMO0001 Reserved for inherently not codable concepts without codable children: Secondary | ICD-10-CM | POA: Insufficient documentation

## 2012-03-29 DIAGNOSIS — M25669 Stiffness of unspecified knee, not elsewhere classified: Secondary | ICD-10-CM | POA: Insufficient documentation

## 2012-03-29 DIAGNOSIS — Z96659 Presence of unspecified artificial knee joint: Secondary | ICD-10-CM | POA: Insufficient documentation

## 2012-03-29 DIAGNOSIS — R262 Difficulty in walking, not elsewhere classified: Secondary | ICD-10-CM | POA: Insufficient documentation

## 2012-04-10 ENCOUNTER — Ambulatory Visit: Payer: Worker's Compensation | Admitting: Physical Therapy

## 2012-04-10 ENCOUNTER — Encounter: Payer: Self-pay | Admitting: Physical Therapy

## 2012-04-12 ENCOUNTER — Encounter: Payer: Self-pay | Admitting: Physical Therapy

## 2012-04-12 ENCOUNTER — Ambulatory Visit: Payer: Worker's Compensation | Admitting: Physical Therapy

## 2012-04-24 ENCOUNTER — Ambulatory Visit: Payer: Worker's Compensation | Attending: Orthopedic Surgery | Admitting: Physical Therapy

## 2012-04-24 DIAGNOSIS — M25669 Stiffness of unspecified knee, not elsewhere classified: Secondary | ICD-10-CM | POA: Insufficient documentation

## 2012-04-24 DIAGNOSIS — Z96659 Presence of unspecified artificial knee joint: Secondary | ICD-10-CM | POA: Insufficient documentation

## 2012-04-24 DIAGNOSIS — R262 Difficulty in walking, not elsewhere classified: Secondary | ICD-10-CM | POA: Insufficient documentation

## 2012-04-24 DIAGNOSIS — M25569 Pain in unspecified knee: Secondary | ICD-10-CM | POA: Insufficient documentation

## 2012-04-24 DIAGNOSIS — IMO0001 Reserved for inherently not codable concepts without codable children: Secondary | ICD-10-CM | POA: Insufficient documentation

## 2012-04-26 ENCOUNTER — Ambulatory Visit: Payer: Worker's Compensation | Admitting: Physical Therapy

## 2012-04-27 ENCOUNTER — Ambulatory Visit: Payer: Worker's Compensation | Admitting: Physical Therapy

## 2012-05-01 ENCOUNTER — Ambulatory Visit: Payer: Worker's Compensation | Attending: Orthopedic Surgery | Admitting: Physical Therapy

## 2012-05-03 ENCOUNTER — Ambulatory Visit: Payer: Worker's Compensation | Attending: Orthopedic Surgery | Admitting: Physical Therapy

## 2012-05-03 DIAGNOSIS — IMO0001 Reserved for inherently not codable concepts without codable children: Secondary | ICD-10-CM | POA: Insufficient documentation

## 2012-05-03 DIAGNOSIS — Z96659 Presence of unspecified artificial knee joint: Secondary | ICD-10-CM | POA: Insufficient documentation

## 2012-05-03 DIAGNOSIS — M25569 Pain in unspecified knee: Secondary | ICD-10-CM | POA: Insufficient documentation

## 2012-05-03 DIAGNOSIS — M25669 Stiffness of unspecified knee, not elsewhere classified: Secondary | ICD-10-CM | POA: Insufficient documentation

## 2012-05-03 DIAGNOSIS — R262 Difficulty in walking, not elsewhere classified: Secondary | ICD-10-CM | POA: Insufficient documentation

## 2012-05-04 ENCOUNTER — Ambulatory Visit: Payer: Worker's Compensation | Attending: Orthopedic Surgery | Admitting: Physical Therapy

## 2012-05-04 DIAGNOSIS — IMO0001 Reserved for inherently not codable concepts without codable children: Secondary | ICD-10-CM | POA: Insufficient documentation

## 2012-05-04 DIAGNOSIS — Z96659 Presence of unspecified artificial knee joint: Secondary | ICD-10-CM | POA: Insufficient documentation

## 2012-05-04 DIAGNOSIS — M25569 Pain in unspecified knee: Secondary | ICD-10-CM | POA: Insufficient documentation

## 2012-05-04 DIAGNOSIS — M25669 Stiffness of unspecified knee, not elsewhere classified: Secondary | ICD-10-CM | POA: Insufficient documentation

## 2012-05-04 DIAGNOSIS — R262 Difficulty in walking, not elsewhere classified: Secondary | ICD-10-CM | POA: Insufficient documentation

## 2012-05-10 ENCOUNTER — Ambulatory Visit: Payer: Worker's Compensation | Attending: Orthopedic Surgery | Admitting: Physical Therapy

## 2012-05-10 DIAGNOSIS — IMO0001 Reserved for inherently not codable concepts without codable children: Secondary | ICD-10-CM | POA: Insufficient documentation

## 2012-05-10 DIAGNOSIS — Z96659 Presence of unspecified artificial knee joint: Secondary | ICD-10-CM | POA: Insufficient documentation

## 2012-05-10 DIAGNOSIS — R262 Difficulty in walking, not elsewhere classified: Secondary | ICD-10-CM | POA: Insufficient documentation

## 2012-05-10 DIAGNOSIS — M25669 Stiffness of unspecified knee, not elsewhere classified: Secondary | ICD-10-CM | POA: Insufficient documentation

## 2012-05-10 DIAGNOSIS — M25569 Pain in unspecified knee: Secondary | ICD-10-CM | POA: Insufficient documentation

## 2012-05-11 ENCOUNTER — Ambulatory Visit: Payer: Worker's Compensation | Attending: Orthopedic Surgery | Admitting: Physical Therapy

## 2012-05-11 DIAGNOSIS — R262 Difficulty in walking, not elsewhere classified: Secondary | ICD-10-CM | POA: Insufficient documentation

## 2012-05-11 DIAGNOSIS — Z96659 Presence of unspecified artificial knee joint: Secondary | ICD-10-CM | POA: Insufficient documentation

## 2012-05-11 DIAGNOSIS — IMO0001 Reserved for inherently not codable concepts without codable children: Secondary | ICD-10-CM | POA: Insufficient documentation

## 2012-05-11 DIAGNOSIS — M25669 Stiffness of unspecified knee, not elsewhere classified: Secondary | ICD-10-CM | POA: Insufficient documentation

## 2012-05-11 DIAGNOSIS — M25569 Pain in unspecified knee: Secondary | ICD-10-CM | POA: Insufficient documentation

## 2012-05-15 ENCOUNTER — Ambulatory Visit: Payer: Worker's Compensation | Attending: Orthopedic Surgery | Admitting: Physical Therapy

## 2012-05-15 DIAGNOSIS — M25569 Pain in unspecified knee: Secondary | ICD-10-CM | POA: Insufficient documentation

## 2012-05-15 DIAGNOSIS — R262 Difficulty in walking, not elsewhere classified: Secondary | ICD-10-CM | POA: Insufficient documentation

## 2012-05-15 DIAGNOSIS — M25669 Stiffness of unspecified knee, not elsewhere classified: Secondary | ICD-10-CM | POA: Insufficient documentation

## 2012-05-15 DIAGNOSIS — IMO0001 Reserved for inherently not codable concepts without codable children: Secondary | ICD-10-CM | POA: Insufficient documentation

## 2012-05-17 ENCOUNTER — Ambulatory Visit: Payer: Worker's Compensation | Attending: Orthopedic Surgery | Admitting: Physical Therapy

## 2012-05-17 DIAGNOSIS — R262 Difficulty in walking, not elsewhere classified: Secondary | ICD-10-CM | POA: Insufficient documentation

## 2012-05-17 DIAGNOSIS — M25569 Pain in unspecified knee: Secondary | ICD-10-CM | POA: Insufficient documentation

## 2012-05-17 DIAGNOSIS — M25669 Stiffness of unspecified knee, not elsewhere classified: Secondary | ICD-10-CM | POA: Insufficient documentation

## 2012-05-17 DIAGNOSIS — IMO0001 Reserved for inherently not codable concepts without codable children: Secondary | ICD-10-CM | POA: Insufficient documentation

## 2012-05-22 ENCOUNTER — Ambulatory Visit: Payer: Worker's Compensation | Attending: Orthopedic Surgery | Admitting: Physical Therapy

## 2012-05-22 DIAGNOSIS — R262 Difficulty in walking, not elsewhere classified: Secondary | ICD-10-CM | POA: Insufficient documentation

## 2012-05-22 DIAGNOSIS — M25569 Pain in unspecified knee: Secondary | ICD-10-CM | POA: Insufficient documentation

## 2012-05-22 DIAGNOSIS — M25669 Stiffness of unspecified knee, not elsewhere classified: Secondary | ICD-10-CM | POA: Insufficient documentation

## 2012-05-22 DIAGNOSIS — IMO0001 Reserved for inherently not codable concepts without codable children: Secondary | ICD-10-CM | POA: Insufficient documentation

## 2012-05-24 ENCOUNTER — Ambulatory Visit: Payer: Worker's Compensation | Attending: Orthopedic Surgery | Admitting: Physical Therapy

## 2013-04-14 DIAGNOSIS — J189 Pneumonia, unspecified organism: Secondary | ICD-10-CM

## 2013-04-14 HISTORY — DX: Pneumonia, unspecified organism: J18.9

## 2013-06-28 ENCOUNTER — Other Ambulatory Visit (HOSPITAL_COMMUNITY): Payer: Self-pay | Admitting: Orthopedic Surgery

## 2013-06-28 ENCOUNTER — Encounter (HOSPITAL_COMMUNITY): Payer: Self-pay | Admitting: Pharmacy Technician

## 2013-06-28 NOTE — Patient Instructions (Addendum)
Your procedure is scheduled on:  07/15/13  MONDAY  Report to [facility] at    0515   AM.  Call this number if you have problems the morning of surgery: [phone]        Do not eat food  Or drink :After Midnight.SUNDAY NIGHT   Take these medicines the morning of surgery with A SIP OF WATER:NONE   May use ALBUTEROL IF NEEDED BRING INHALER WITH YOU TO HOSPITAL   .  Contacts, dentures or partial plates, or metal hairpins  can not be worn to surgery. Your family will be responsible for glasses, dentures, hearing aides while you are in surgery  Leave suitcase in the car. After surgery it may be brought to your room.  For patients admitted to the hospital, checkout time is 11:00 AM day of  discharge.                DO NOT WEAR JEWELRY, LOTIONS, POWDERS, OR PERFUMES.  WOMEN-- DO NOT SHAVE LEGS OR UNDERARMS FOR 48 HOURS BEFORE SHOWERS. MEN MAY SHAVE FACE.  Patients discharged the day of surgery will not be allowed to drive home. IF going home the day of surgery, you must have a driver and someone to stay with you for the first 24 hours                                                                  [facility] - Preparing for Surgery Before surgery, you can play an important role.  Because skin is not sterile, your skin needs to be as free of germs as possible.  You can reduce the number of germs on your skin by washing with CHG (chlorahexidine gluconate) soap before surgery.  CHG is an antiseptic cleaner which kills germs and bonds with the skin to continue killing germs even after washing. Please DO NOT use if you have an allergy to CHG or antibacterial soaps.  If your skin becomes reddened/irritated stop using the CHG and inform your nurse when you arrive at Short Stay. Do not shave (including legs and underarms) for at least 48 hours prior to the first CHG shower.  You may shave your face. Please follow these instructions carefully:  1.  Shower with CHG Soap the night  before surgery and the  morning of Surgery.  2.  If you choose to wash your hair, wash your hair first as usual with your  normal  shampoo.  3.  After you shampoo, rinse your hair and body thoroughly to remove the  shampoo.                           4.  Use CHG as you would any other liquid soap.  You can apply chg directly  to the skin and wash                       Gently with a scrungie or clean washcloth.  5.  Apply the CHG Soap to your body ONLY FROM THE NECK DOWN.   Do not use on open  Wound or open sores. Avoid contact with eyes, ears mouth and genitals (private parts).                        Genitals (private parts) with your normal soap.             6.  Wash thoroughly, paying special attention to the area where your surgery  will be performed.  7.  Thoroughly rinse your body with warm water from the neck down.  8.  DO NOT shower/wash with your normal soap after using and rinsing off  the CHG Soap.                9.  Pat yourself dry with a clean towel.            10.  Wear clean pajamas.            11.  Place clean sheets on your bed the night of your first shower and do not  sleep with pets. Day of Surgery : Do not apply any lotions/deodorants the morning of surgery.  Please wear clean clothes to the hospital/surgery center.  FAILURE TO FOLLOW THESE INSTRUCTIONS MAY RESULT IN THE CANCELLATION OF YOUR SURGERY PATIENT SIGNATURE_________________________________  NURSE SIGNATURE__________________________________    Incentive Spirometer  An incentive spirometer is a tool that can help keep your lungs clear and active. This tool measures how well you are filling your lungs with each breath. Taking long deep breaths may help reverse or decrease the chance of developing breathing (pulmonary) problems (especially infection) following:  A long period of time when you are unable to move or be active. BEFORE THE PROCEDURE   If the spirometer includes an indicator  to show your best effort, your nurse or respiratory therapist will set it to a desired goal.  If possible, sit up straight or lean slightly forward. Try not to slouch.  Hold the incentive spirometer in an upright position. INSTRUCTIONS FOR USE  1. Sit on the edge of your bed if possible, or sit up as far as you can in bed or on a chair. 2. Hold the incentive spirometer in an upright position. 3. Breathe out normally. 4. Place the mouthpiece in your mouth and seal your lips tightly around it. 5. Breathe in slowly and as deeply as possible, raising the piston or the ball toward the top of the column. 6. Hold your breath for 3-5 seconds or for as long as possible. Allow the piston or ball to fall to the bottom of the column. 7. Remove the mouthpiece from your mouth and breathe out normally. 8. Rest for a few seconds and repeat Steps 1 through 7 at least 10 times every 1-2 hours when you are awake. Take your time and take a few normal breaths between deep breaths. 9. The spirometer may include an indicator to show your best effort. Use the indicator as a goal to work toward during each repetition. 10. After each set of 10 deep breaths, practice coughing to be sure your lungs are clear. If you have an incision (the cut made at the time of surgery), support your incision when coughing by placing a pillow or rolled up towels firmly against it. Once you are able to get out of bed, walk around indoors and cough well. You may stop using the incentive spirometer when instructed by your caregiver.  RISKS AND COMPLICATIONS  Take your time so you do not get dizzy or light-headed.  If you are in pain, you may need to take or ask for pain medication before doing incentive spirometry. It is harder to take a deep breath if you are having pain. AFTER USE  Rest and breathe slowly and easily.  It can be helpful to keep track of a log of your progress. Your caregiver can provide you with a simple table to help  with this. If you are using the spirometer at home, follow these instructions: SEEK MEDICAL CARE IF:   You are having difficultly using the spirometer.  You have trouble using the spirometer as often as instructed.  Your pain medication is not giving enough relief while using the spirometer.  You develop fever of 100.5 F (38.1 C) or higher. SEEK IMMEDIATE MEDICAL CARE IF:   You cough up bloody sputum that had not been present before.  You develop fever of 102 F (38.9 C) or greater.  You develop worsening pain at or near the incision site. MAKE SURE YOU:   Understand these instructions.  Will watch your condition.  Will get help right away if you are not doing well or get worse. Document Released: 07/11/2006 Document Revised: 05/23/2011 Document Reviewed: 09/11/2006 [facility] Patient Information 2014 [place], [place].   WHAT IS A BLOOD TRANSFUSION? Blood Transfusion Information  A transfusion is the replacement of blood or some of its parts. Blood is made up of multiple cells which provide different functions.  Red blood cells carry oxygen and are used for blood loss replacement.  White blood cells fight against infection.  Platelets control bleeding.  Plasma helps clot blood.  Other blood products are available for specialized needs, such as hemophilia or other clotting disorders. BEFORE THE TRANSFUSION  Who gives blood for transfusions?   Healthy volunteers who are fully evaluated to make sure their blood is safe. This is blood bank blood. Transfusion therapy is the safest it has ever been in the practice of medicine. Before blood is taken from a donor, a complete history is taken to make sure that person has no history of diseases nor engages in risky social behavior (examples are intravenous drug use or sexual activity with multiple partners). The donor's travel history is screened to minimize risk of transmitting infections, such as malaria. The donated blood is  tested for signs of infectious diseases, such as HIV and hepatitis. The blood is then tested to be sure it is compatible with you in order to minimize the chance of a transfusion reaction. If you or a relative donates blood, this is often done in anticipation of surgery and is not appropriate for emergency situations. It takes many days to process the donated blood. RISKS AND COMPLICATIONS Although transfusion therapy is very safe and saves many lives, the main dangers of transfusion include:   Getting an infectious disease.  Developing a transfusion reaction. This is an allergic reaction to something in the blood you were given. Every precaution is taken to prevent this. The decision to have a blood transfusion has been considered carefully by your caregiver before blood is given. Blood is not given unless the benefits outweigh the risks. AFTER THE TRANSFUSION  Right after receiving a blood transfusion, you will usually feel much better and more energetic. This is especially true if your red blood cells have gotten low (anemic). The transfusion raises the level of the red blood cells which carry oxygen, and this usually causes an energy increase.  The nurse administering the transfusion will monitor you carefully for complications. HOME  CARE INSTRUCTIONS  No special instructions are needed after a transfusion. You may find your energy is better. Speak with your caregiver about any limitations on activity for underlying diseases you may have. SEEK MEDICAL CARE IF:   Your condition is not improving after your transfusion.  You develop redness or irritation at the intravenous (IV) site. SEEK IMMEDIATE MEDICAL CARE IF:  Any of the following symptoms occur over the next 12 hours:  Shaking chills.  You have a temperature by mouth above 102 F (38.9 C), not controlled by medicine.  Chest, back, or muscle pain.  People around you feel you are not acting correctly or are confused.  Shortness  of breath or difficulty breathing.  Dizziness and fainting.  You get a rash or develop hives.  You have a decrease in urine output.  Your urine turns a dark color or changes to pink, red, or brown. Any of the following symptoms occur over the next 10 days:  You have a temperature by mouth above 102 F (38.9 C), not controlled by medicine.  Shortness of breath.  Weakness after normal activity.  The white part of the eye turns yellow (jaundice).  You have a decrease in the amount of urine or are urinating less often.  Your urine turns a dark color or changes to pink, red, or brown. Document Released: 02/26/2000 Document Revised: 05/23/2011 Document Reviewed: 10/15/2007 [facility] Patient Information 2014 [place], [place].

## 2013-07-01 ENCOUNTER — Encounter (HOSPITAL_COMMUNITY): Payer: Self-pay

## 2013-07-01 ENCOUNTER — Ambulatory Visit (HOSPITAL_COMMUNITY)
Admission: RE | Admit: 2013-07-01 | Discharge: 2013-07-01 | Disposition: A | Discharge status: Home / Self Care | Payer: Worker's Compensation | Source: Ambulatory Visit | Attending: Anesthesiology | Admitting: Anesthesiology

## 2013-07-01 ENCOUNTER — Encounter (INDEPENDENT_AMBULATORY_CARE_PROVIDER_SITE_OTHER): Payer: Self-pay

## 2013-07-01 ENCOUNTER — Encounter (HOSPITAL_COMMUNITY)
Admission: RE | Admit: 2013-07-01 | Discharge: 2013-07-01 | Disposition: A | Discharge status: Home / Self Care | Payer: Worker's Compensation | Source: Ambulatory Visit | Attending: Orthopedic Surgery | Admitting: Orthopedic Surgery

## 2013-07-01 DIAGNOSIS — I4510 Unspecified right bundle-branch block: Secondary | ICD-10-CM | POA: Insufficient documentation

## 2013-07-01 DIAGNOSIS — Z01818 Encounter for other preprocedural examination: Secondary | ICD-10-CM | POA: Diagnosis not present

## 2013-07-01 DIAGNOSIS — Z01812 Encounter for preprocedural laboratory examination: Secondary | ICD-10-CM | POA: Insufficient documentation

## 2013-07-01 DIAGNOSIS — K449 Diaphragmatic hernia without obstruction or gangrene: Secondary | ICD-10-CM | POA: Insufficient documentation

## 2013-07-01 DIAGNOSIS — M47814 Spondylosis without myelopathy or radiculopathy, thoracic region: Secondary | ICD-10-CM | POA: Insufficient documentation

## 2013-07-01 DIAGNOSIS — Z01810 Encounter for preprocedural cardiovascular examination: Secondary | ICD-10-CM | POA: Insufficient documentation

## 2013-07-01 HISTORY — DX: Pneumonia, unspecified organism: J18.9

## 2013-07-01 HISTORY — DX: Gastro-esophageal reflux disease without esophagitis: K21.9

## 2013-07-01 LAB — CBC
HCT: 43.3 % (ref 36.0–46.0)
Hemoglobin: 13.7 g/dL (ref 12.0–15.0)
MCH: 25.9 pg — ABNORMAL LOW (ref 26.0–34.0)
MCHC: 31.6 g/dL (ref 30.0–36.0)
MCV: 81.9 fL (ref 78.0–100.0)
Platelets: 260 10*3/uL (ref 150–400)
RBC: 5.29 MIL/uL — ABNORMAL HIGH (ref 3.87–5.11)
RDW: 14.1 % (ref 11.5–15.5)
WBC: 7 10*3/uL (ref 4.0–10.5)

## 2013-07-01 LAB — URINALYSIS, ROUTINE W REFLEX MICROSCOPIC
Bilirubin Urine: NEGATIVE
Glucose, UA: NEGATIVE mg/dL
Hgb urine dipstick: NEGATIVE
Ketones, ur: NEGATIVE mg/dL
Leukocytes, UA: NEGATIVE
Nitrite: NEGATIVE
Protein, ur: NEGATIVE mg/dL
Specific Gravity, Urine: 1.01 (ref 1.005–1.030)
Urobilinogen, UA: 0.2 mg/dL (ref 0.0–1.0)
pH: 6 (ref 5.0–8.0)

## 2013-07-01 LAB — SURGICAL PCR SCREEN
MRSA, PCR: NEGATIVE
Staphylococcus aureus: NEGATIVE

## 2013-07-01 LAB — BASIC METABOLIC PANEL
BUN: 16 mg/dL (ref 6–23)
CO2: 26 mEq/L (ref 19–32)
Calcium: 9.2 mg/dL (ref 8.4–10.5)
Chloride: 106 mEq/L (ref 96–112)
Creatinine, Ser: 0.69 mg/dL (ref 0.50–1.10)
GFR calc Af Amer: >90 mL/min (ref >90)
GFR calc non Af Amer: 89 mL/min — ABNORMAL LOW (ref >90)
Glucose, Bld: 92 mg/dL (ref 70–99)
Potassium: 4.2 mEq/L (ref 3.7–5.3)
Sodium: 143 mEq/L (ref 137–147)

## 2013-07-01 LAB — PROTIME-INR
INR: 1.03 (ref 0.00–1.49)
Prothrombin Time: 13.3 seconds (ref 11.6–15.2)

## 2013-07-01 LAB — APTT: aPTT: 35 seconds (ref 24–37)

## 2013-07-01 NOTE — Progress Notes (Signed)
Informed Kathryn Olsen to take Catapres morning of surgery and if her PCP changes her BP med before surgery she is to call me.  Faxed note to Dr Charlann Boxerlin regarding BP's today via EPIC

## 2013-07-01 NOTE — Progress Notes (Signed)
07/01/13 0832  OBSTRUCTIVE SLEEP APNEA  Have you ever been diagnosed with sleep apnea through a sleep study? No  Do you snore loudly (loud enough to be heard through closed doors)?  0  Do you often feel tired, fatigued, or sleepy during the daytime? 1  Has anyone observed you stop breathing during your sleep? 0  Do you have, or are you being treated for high blood pressure? 1  BMI more than 35 kg/m2? 1  Age over 65 years old? 1  Gender: 0  Obstructive Sleep Apnea Score 4  Score 4 or greater  Results sent to PCP

## 2013-07-01 NOTE — Progress Notes (Signed)
Dr Charlann Boxerlin-  I  Guy SandiferSaw Sindia today for PST visit. Admits to HTN but hasnt taken meds in months due to finances.  At visit today  BP's 198/111, 165/109.  Denies any headache.  I instructed her to go to PCP as she is not on any meds at this time.  She called me back to inform me that PCT started her on Catapres 0.2 mg bid and HCTZ 25 mg daily.  She is to follow up with them.[name] Kitchen. FYI  Thanks

## 2013-07-09 NOTE — Progress Notes (Signed)
Received fax  From [facility] that patient needs cardiology eval before PCP- T Leavy CellaBoyd will clear her for surgery

## 2013-07-10 ENCOUNTER — Other Ambulatory Visit: Payer: Self-pay | Admitting: Physician Assistant

## 2013-07-10 NOTE — H&P (Signed)
Kathryn Olsen is an 63 y.o. female.    Chief Complaint: left knee pain s/p total knee arthroplasty  HPI: Pt is a 63 y.o. female complaining of left knee pain since having her knee  replaced. Pain had continually increased since the beginning. X-rays in the clinic show left total knee arthroplasty. Pt has tried various conservative treatments which have failed to alleviate their symptoms, including therapy and time. Various options are discussed with the patient. Risks, benefits and expectations were discussed with the patient. Patient understand the risks, benefits and expectations and wishes to proceed with surgery.   PCP:  Iona HansenJones, Penny L, NP  D/C Plans:  Home with HHPT  PMH: Past Medical History  Diagnosis Date  . Degeneration of intervertebral disc, site unspecified   . Dyslipidemia   . DJD (degenerative joint disease)   . Asthma   . COPD (chronic obstructive pulmonary disease)     questionable  . Depression   . Sleep apnea     stopbang=4  . Unspecified essential hypertension   . GERD (gastroesophageal reflux disease)   . Pneumonia 2/15  . Shortness of breath     on exertion    PSH: Past Surgical History  Procedure Laterality Date  . Appendectomy    . Ectopic pregnancy surgery    . Knee arthroscopy  2010     Right knee  . Knee arthroscopy  2012    Left knee  . Total knee arthroplasty  11/22/2011    Procedure: TOTAL KNEE ARTHROPLASTY;  Surgeon: Drucilla SchmidtJames P Aplington, MD;  Location: [facility];  Service: Orthopedics;  Laterality: Left;    Social History:  reports that she has never smoked. She has never used smokeless tobacco. She reports that she drinks about .6 ounces of alcohol per week. She reports that she does not use illicit drugs.  Allergies:  Allergies  Allergen Reactions  . Morphine And Related Anaphylaxis and Itching    Medications: Current Outpatient Prescriptions  Medication Sig Dispense Refill  . albuterol (PROVENTIL HFA;VENTOLIN HFA) 108 (90  BASE) MCG/ACT inhaler Inhale 2 puffs into the lungs every 6 (six) hours as needed for wheezing or shortness of breath.       . cloNIDine (CATAPRES) 0.2 MG tablet Take 0.2 mg by mouth 2 (two) times daily.      . hydrochlorothiazide (HYDRODIURIL) 25 MG tablet Take 25 mg by mouth daily.      . meloxicam (MOBIC) 15 MG tablet Take 15 mg by mouth every evening.      [name] Omeprazole-Sodium Bicarbonate (ZEGERID OTC PO) Take 1 capsule by mouth every evening.       No current facility-administered medications for this visit.    No results found for this or any previous visit (from the past 48 hour(s)). No results found.  ROS: Pain with rom of the left lower extremity  Physical Exam: BP:   162/94  ;  HR:   80  ; Resp:   14  : Alert and oriented 63 y.o. female in no acute distress Cranial nerves 2-12 intact Cervical spine: full rom with no tenderness, nv intact distally Chest: active breath sounds bilaterally, no wheeze rhonchi or rales Heart: regular rate and rhythm, no murmur Abd: non tender non distended with active bowel sounds Hip is stable with rom  Left knee with moderate medial joint line tenderness nv intact distally No rashes or edema Antalgic gait  Assessment/Plan Assessment: left knee pain/stiffness s/p total knee arthroplasty  Plan: Patient will undergo  a left knee revision by Dr.Olin. Risks benefits and expectations were discussed with the patient. Patient understand risks, benefits and expectations and wishes to proceed.

## 2013-07-11 ENCOUNTER — Encounter (HOSPITAL_COMMUNITY): Payer: Self-pay | Admitting: Anesthesiology

## 2013-07-11 NOTE — Progress Notes (Signed)
LATE ENTRY for 07/10/13 Left voice mail [facility] the note she faxed regarding patient needing cardiac clearance.[name] Kitchen.[name] Kitchen.07/11/13 1335- left message with Rosal[facility] FerronSherry Wills [facility] regarding [facility] clearance

## 2013-07-15 ENCOUNTER — Inpatient Hospital Stay (HOSPITAL_COMMUNITY)
Admission: RE | Admit: 2013-07-15 | Payer: Worker's Compensation | Source: Ambulatory Visit | Admitting: Orthopedic Surgery

## 2013-07-15 ENCOUNTER — Encounter (HOSPITAL_COMMUNITY): Admission: RE | Payer: Self-pay | Source: Ambulatory Visit

## 2013-07-15 SURGERY — TOTAL KNEE REVISION WITH SCAR DEBRIDEMENT/PATELLA REVISION WITH POLY EXCHANGE
Anesthesia: Spinal | Site: Knee | Laterality: Left

## 2013-07-30 ENCOUNTER — Encounter (HOSPITAL_COMMUNITY): Payer: Self-pay | Admitting: Emergency Medicine

## 2013-07-30 ENCOUNTER — Emergency Department (HOSPITAL_COMMUNITY): Payer: Medicare PPO

## 2013-07-30 ENCOUNTER — Emergency Department (HOSPITAL_COMMUNITY)
Admission: EM | Admit: 2013-07-30 | Discharge: 2013-07-30 | Disposition: A | Discharge status: Home / Self Care | Payer: Medicare PPO | Attending: Emergency Medicine | Admitting: Emergency Medicine

## 2013-07-30 DIAGNOSIS — Z862 Personal history of diseases of the blood and blood-forming organs and certain disorders involving the immune mechanism: Secondary | ICD-10-CM | POA: Insufficient documentation

## 2013-07-30 DIAGNOSIS — R110 Nausea: Secondary | ICD-10-CM | POA: Insufficient documentation

## 2013-07-30 DIAGNOSIS — I10 Essential (primary) hypertension: Secondary | ICD-10-CM | POA: Insufficient documentation

## 2013-07-30 DIAGNOSIS — Z8701 Personal history of pneumonia (recurrent): Secondary | ICD-10-CM | POA: Insufficient documentation

## 2013-07-30 DIAGNOSIS — Z79899 Other long term (current) drug therapy: Secondary | ICD-10-CM | POA: Insufficient documentation

## 2013-07-30 DIAGNOSIS — R55 Syncope and collapse: Secondary | ICD-10-CM | POA: Insufficient documentation

## 2013-07-30 DIAGNOSIS — J4489 Other specified chronic obstructive pulmonary disease: Secondary | ICD-10-CM | POA: Insufficient documentation

## 2013-07-30 DIAGNOSIS — M1990 Unspecified osteoarthritis, unspecified site: Secondary | ICD-10-CM | POA: Insufficient documentation

## 2013-07-30 DIAGNOSIS — Z8639 Personal history of other endocrine, nutritional and metabolic disease: Secondary | ICD-10-CM | POA: Insufficient documentation

## 2013-07-30 DIAGNOSIS — R51 Headache: Secondary | ICD-10-CM | POA: Insufficient documentation

## 2013-07-30 DIAGNOSIS — Z8659 Personal history of other mental and behavioral disorders: Secondary | ICD-10-CM | POA: Insufficient documentation

## 2013-07-30 DIAGNOSIS — K219 Gastro-esophageal reflux disease without esophagitis: Secondary | ICD-10-CM | POA: Insufficient documentation

## 2013-07-30 DIAGNOSIS — R42 Dizziness and giddiness: Secondary | ICD-10-CM | POA: Insufficient documentation

## 2013-07-30 DIAGNOSIS — Z791 Long term (current) use of non-steroidal anti-inflammatories (NSAID): Secondary | ICD-10-CM | POA: Insufficient documentation

## 2013-07-30 DIAGNOSIS — J449 Chronic obstructive pulmonary disease, unspecified: Secondary | ICD-10-CM | POA: Insufficient documentation

## 2013-07-30 DIAGNOSIS — H9319 Tinnitus, unspecified ear: Secondary | ICD-10-CM | POA: Insufficient documentation

## 2013-07-30 LAB — CBC WITH DIFFERENTIAL/PLATELET
Basophils Absolute: 0 10*3/uL (ref 0.0–0.1)
Basophils Relative: 1 % (ref 0–1)
Eosinophils Absolute: 0.2 10*3/uL (ref 0.0–0.7)
Eosinophils Relative: 3 % (ref 0–5)
HCT: 41.6 % (ref 36.0–46.0)
Hemoglobin: 13.2 g/dL (ref 12.0–15.0)
Lymphocytes Relative: 27 % (ref 12–46)
Lymphs Abs: 1.9 10*3/uL (ref 0.7–4.0)
MCH: 26.2 pg (ref 26.0–34.0)
MCHC: 31.7 g/dL (ref 30.0–36.0)
MCV: 82.7 fL (ref 78.0–100.0)
Monocytes Absolute: 0.5 10*3/uL (ref 0.1–1.0)
Monocytes Relative: 7 % (ref 3–12)
Neutro Abs: 4.4 10*3/uL (ref 1.7–7.7)
Neutrophils Relative %: 62 % (ref 43–77)
Platelets: 247 10*3/uL (ref 150–400)
RBC: 5.03 MIL/uL (ref 3.87–5.11)
RDW: 14.1 % (ref 11.5–15.5)
WBC: 7.1 10*3/uL (ref 4.0–10.5)

## 2013-07-30 LAB — COMPREHENSIVE METABOLIC PANEL
ALT: 12 U/L (ref 0–35)
AST: 28 U/L (ref 0–37)
Albumin: 3.6 g/dL (ref 3.5–5.2)
Alkaline Phosphatase: 90 U/L (ref 39–117)
BUN: 19 mg/dL (ref 6–23)
CO2: 24 mEq/L (ref 19–32)
Calcium: 9 mg/dL (ref 8.4–10.5)
Chloride: 103 mEq/L (ref 96–112)
Creatinine, Ser: 0.73 mg/dL (ref 0.50–1.10)
GFR calc Af Amer: >90 mL/min (ref >90)
GFR calc non Af Amer: 88 mL/min — ABNORMAL LOW (ref >90)
Glucose, Bld: 107 mg/dL — ABNORMAL HIGH (ref 70–99)
Potassium: 5.7 mEq/L — ABNORMAL HIGH (ref 3.7–5.3)
Sodium: 140 mEq/L (ref 137–147)
Total Bilirubin: 0.3 mg/dL (ref 0.3–1.2)
Total Protein: 6.8 g/dL (ref 6.0–8.3)

## 2013-07-30 MED ORDER — MECLIZINE HCL 25 MG PO TABS
25.0000 mg | ORAL_TABLET | Freq: Once | ORAL | Status: AC
  Administered 2013-07-30: 25 mg via ORAL
  Filled 2013-07-30: qty 1

## 2013-07-30 MED ORDER — MECLIZINE HCL 25 MG PO TABS: 25.0000 mg | ORAL_TABLET | Freq: Three times a day (TID) | ORAL | Status: DC | PRN

## 2013-07-30 NOTE — ED Notes (Signed)
Pt reports dizziness, pressure around head, and ringing in ears since awaking this am. Pt reports take blood pressure medication because she thought her pressure was high. Pt denies injury leading to compliant. Pt denies pain at present time.

## 2013-07-30 NOTE — ED Notes (Addendum)
Pt states " I feel like my head is not attached to me". Pt states she cannot move her head or it shakes, states "my head feels weird. Pt states she also feel nauseous.

## 2013-07-30 NOTE — ED Provider Notes (Signed)
CSN: [id number]     Arrival date & time 07/30/13  1024 History   First MD Initiated Contact with Patient 07/30/13 1104     Chief Complaint  Patient presents with  . Dizziness  . Near Syncope     (Consider location/radiation/quality/duration/timing/severity/associated sxs/prior Treatment) HPI Comments: Patient is a 63 year old female with history of COPD and depression. She presents today with complaints of dizziness that started approximately 6 AM. She states that her head feels wobbly and she feels as if she is spinning. She feels nauseated but has not vomited. She reports headache and ringing in the ears. She denies fevers, chills, stiff neck. This has never happened before.  Patient is a 63 y.o. female presenting with dizziness and near-syncope. The history is provided by the patient.  Dizziness Quality:  Head spinning and imbalance Severity:  Moderate Onset quality:  Sudden Duration:  4 hours Timing:  Constant Progression:  Worsening Chronicity:  New Context: bending over and head movement   Relieved by:  Being still Worsened by:  Nothing tried Ineffective treatments:  None tried Associated symptoms: headaches and nausea   Associated symptoms: no palpitations   Near Syncope Associated symptoms include headaches.    Past Medical History  Diagnosis Date  . Degeneration of intervertebral disc, site unspecified   . Dyslipidemia   . DJD (degenerative joint disease)   . Asthma   . COPD (chronic obstructive pulmonary disease)     questionable  . Depression   . Sleep apnea     stopbang=4  . Unspecified essential hypertension   . GERD (gastroesophageal reflux disease)   . Pneumonia 2/15  . Shortness of breath     on exertion   Past Surgical History  Procedure Laterality Date  . Appendectomy    . Ectopic pregnancy surgery    . Knee arthroscopy  2010     Right knee  . Knee arthroscopy  2012    Left knee  . Total knee arthroplasty  11/22/2011    Procedure: TOTAL KNEE  ARTHROPLASTY;  Surgeon: Drucilla SchmidtJames P Aplington, MD;  Location: [facility];  Service: Orthopedics;  Laterality: Left;   Family History  Problem Relation Age of Onset  . Lung cancer Father   . COPD Mother   . Hypertension Mother   . Alcohol abuse Brother   . Melanoma Brother    History  Substance Use Topics  . Smoking status: Never Smoker   . Smokeless tobacco: Never Used  . Alcohol Use: 0.6 oz/week    1 Glasses of wine per week     Comment: occasional glass of wine   OB History   Grav Para Term Preterm Abortions TAB SAB Ect Mult Living                 Review of Systems  Cardiovascular: Positive for near-syncope. Negative for palpitations.  Gastrointestinal: Positive for nausea.  Neurological: Positive for dizziness and headaches.  All other systems reviewed and are negative.     Allergies  Morphine and related  Home Medications   Prior to Admission medications   Medication Sig Start Date End Date Taking? Authorizing Provider  albuterol (PROVENTIL HFA;VENTOLIN HFA) 108 (90 BASE) MCG/ACT inhaler Inhale 2 puffs into the lungs every 6 (six) hours as needed for wheezing or shortness of breath.    Yes Historical Provider, MD  amLODipine-olmesartan (AZOR) 5-20 MG per tablet Take 1 tablet by mouth daily.   Yes Historical Provider, MD  calcium-vitamin D 250-100 MG-UNIT per tablet Take  1 tablet by mouth daily.   Yes Historical Provider, MD  cloNIDine (CATAPRES) 0.2 MG tablet Take 0.2 mg by mouth 3 (three) times daily.   Yes Historical Provider, MD  hydrochlorothiazide (HYDRODIURIL) 25 MG tablet Take 25 mg by mouth daily. 07/01/13  Yes Historical Provider, MD  meloxicam (MOBIC) 15 MG tablet Take 15 mg by mouth every evening.   Yes Historical Provider, MD  Omeprazole-Sodium Bicarbonate (ZEGERID OTC PO) Take 1 capsule by mouth every evening.   Yes Historical Provider, MD   BP 138/89  Pulse 83  Temp(Src) 98.2 F (36.8 C) (Oral)  Resp 20  SpO2 97% Physical Exam  Nursing note and vitals  reviewed. Constitutional: She is oriented to person, place, and time. She appears well-developed and well-nourished. No distress.  HENT:  Head: Normocephalic and atraumatic.  Mouth/Throat: Oropharynx is clear and moist.  TMs are clear bilaterally.  Eyes: EOM are normal. Pupils are equal, round, and reactive to light.  Neck: Normal range of motion. Neck supple.  Cardiovascular: Normal rate and regular rhythm.  Exam reveals no gallop and no friction rub.   No murmur heard. Pulmonary/Chest: Effort normal and breath sounds normal. No respiratory distress. She has no wheezes.  Abdominal: Soft. Bowel sounds are normal. She exhibits no distension. There is no tenderness.  Musculoskeletal: Normal range of motion.  Neurological: She is alert and oriented to person, place, and time. No cranial nerve deficit. She exhibits normal muscle tone. Coordination normal.  Symptoms are worsened with turning the head and change in the position.  Skin: Skin is warm and dry. She is not diaphoretic.    ED Course  Procedures (including critical care time) Labs Review Labs Reviewed  CBC WITH DIFFERENTIAL  COMPREHENSIVE METABOLIC PANEL    Imaging Review No results found.   EKG Interpretation None      MDM   Final diagnoses:  None    Asian is a 63 year old female who presents with dizziness that is consistent with a peripheral vertigo. She is feeling better with meclizine and laboratory studies and CT of the head is unremarkable. Her neurologic exam is nonfocal and I believe she is appropriate for discharge. She understands to return if her symptoms substantially worsen or change. She will be given a prescription for meclizine.    Geoffery Lyonsouglas Yailene Badia, MD 07/30/13 1256

## 2013-07-30 NOTE — Discharge Instructions (Signed)
Meclizine as prescribed as needed for dizziness.  Return to the emergency department if you develop severe headache, worsening of symptoms, or other new and concerning symptoms.   Benign Positional Vertigo Vertigo means you feel like you or your surroundings are moving when they are not. Benign positional vertigo is the most common form of vertigo. Benign means that the cause of your condition is not serious. Benign positional vertigo is more common in older adults. CAUSES  Benign positional vertigo is the result of an upset in the labyrinth system. This is an area in the middle ear that helps control your balance. This may be caused by a viral infection, head injury, or repetitive motion. However, often no specific cause is found. SYMPTOMS  Symptoms of benign positional vertigo occur when you move your head or eyes in different directions. Some of the symptoms may include:  Loss of balance and falls.  Vomiting.  Blurred vision.  Dizziness.  Nausea.  Involuntary eye movements (nystagmus). DIAGNOSIS  Benign positional vertigo is usually diagnosed by physical exam. If the specific cause of your benign positional vertigo is unknown, your caregiver may perform imaging tests, such as magnetic resonance imaging (MRI) or computed tomography (CT). TREATMENT  Your caregiver may recommend movements or procedures to correct the benign positional vertigo. Medicines such as meclizine, benzodiazepines, and medicines for nausea may be used to treat your symptoms. In rare cases, if your symptoms are caused by certain conditions that affect the inner ear, you may need surgery. HOME CARE INSTRUCTIONS   Follow your caregiver's instructions.  Move slowly. Do not make sudden body or head movements.  Avoid driving.  Avoid operating heavy machinery.  Avoid performing any tasks that would be dangerous to you or others during a vertigo episode.  Drink enough fluids to keep your urine clear or pale  yellow. SEEK IMMEDIATE MEDICAL CARE IF:   You develop problems with walking, weakness, numbness, or using your arms, hands, or legs.  You have difficulty speaking.  You develop severe headaches.  Your nausea or vomiting continues or gets worse.  You develop visual changes.  Your family or friends notice any behavioral changes.  Your condition gets worse.  You have a fever.  You develop a stiff neck or sensitivity to light. MAKE SURE YOU:   Understand these instructions.  Will watch your condition.  Will get help right away if you are not doing well or get worse. Document Released: 12/06/2005 Document Revised: 05/23/2011 Document Reviewed: 11/18/2010 [facility] Patient Information 2014 [employer] [place].

## 2013-07-30 NOTE — ED Notes (Signed)
MD at bedside. 

## 2013-08-09 ENCOUNTER — Ambulatory Visit (INDEPENDENT_AMBULATORY_CARE_PROVIDER_SITE_OTHER): Payer: Worker's Compensation | Admitting: Internal Medicine

## 2013-08-09 ENCOUNTER — Encounter: Payer: Self-pay | Admitting: Internal Medicine

## 2013-08-09 ENCOUNTER — Encounter (HOSPITAL_COMMUNITY): Payer: Self-pay | Admitting: *Deleted

## 2013-08-09 VITALS — BP 136/68 | Ht 66.0 in | Wt 237.0 lb

## 2013-08-09 DIAGNOSIS — R079 Chest pain, unspecified: Secondary | ICD-10-CM

## 2013-08-09 DIAGNOSIS — Z01810 Encounter for preprocedural cardiovascular examination: Secondary | ICD-10-CM | POA: Diagnosis not present

## 2013-08-09 DIAGNOSIS — I10 Essential (primary) hypertension: Secondary | ICD-10-CM | POA: Diagnosis not present

## 2013-08-09 DIAGNOSIS — R0609 Other forms of dyspnea: Secondary | ICD-10-CM | POA: Diagnosis not present

## 2013-08-09 DIAGNOSIS — R0600 Dyspnea, unspecified: Secondary | ICD-10-CM

## 2013-08-09 DIAGNOSIS — R0989 Other specified symptoms and signs involving the circulatory and respiratory systems: Secondary | ICD-10-CM

## 2013-08-09 MED ORDER — AMLODIPINE BESYLATE 5 MG PO TABS: 5.0000 mg | ORAL_TABLET | Freq: Every day | ORAL | Status: DC

## 2013-08-09 MED ORDER — OLMESARTAN MEDOXOMIL-HCTZ 40-25 MG PO TABS: 1.0000 | ORAL_TABLET | Freq: Every day | ORAL | Status: DC

## 2013-08-09 NOTE — Patient Instructions (Addendum)
MEDICATION CHANGES 1. STOP AZOR 2. STOP hydrochlorothiazide 3. START amlodipine 5mg  once daily 4. START benicar-hct 40-25mg  once daily 5. DECREASE clonidine to 0.2mg  once daily at bedtime  Your physician has requested that you have a lexiscan myoview. For further information please visit [email or web address] Please follow instruction sheet, as given.  Your physician recommends that you schedule a follow-up appointment in: 1 week with Belenda Cruise (pharmacist) for BP check  Your physician recommends that you schedule a follow-up appointment in: 1 month with Dr. Rennis Golden

## 2013-08-09 NOTE — Progress Notes (Signed)
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  She is problems with her knees and had a left knee replacement in the past however it is giving her more issues. She was scheduled for a revision of that left knee and during her preoperative exam was noted to have marked malignant hypertension with a blood pressure of 220 systolic. She reports she had not taken her medications that day. Recently she's been having increased blood pressures and was started in the end of April on clonidine 0.2 mg 3 times a day by her primary care provider. She was taking this for several weeks and then her prescription ran out. She also lost her insurance coverage but has coverage began. She is now started retaking her clonidine, but notes significant dry mouth, fatigue and side effects which are treatable to the medication. Fortunately her blood pressure is better today at 136/68. Just a reports progressive increase in shortness of breath with exertion especially walking up stairs. She has had significant weight gain over the past year due to inactivity and problems with her knees. She denies any chest pain. There is a history of heart disease in her mother who had a heart attack and had COPD. She was also told that she has early findings of COPD but was never a smoker. She does report that she had some relief and wheezing which he gets periodically with a bronchodilator however that caused her some palpitations. She is referred for aggressive dyspnea, blood pressure control and preoperative cardiovascular risk assessment.  PMHx:  Past Medical History  Diagnosis Date  . Degeneration of intervertebral disc, site unspecified   . Dyslipidemia   . DJD (degenerative joint disease)   . Asthma   . COPD (chronic obstructive  pulmonary disease)     questionable  . Depression   . Sleep apnea     stopbang=4  . Unspecified essential hypertension   . GERD (gastroesophageal reflux disease)   . Pneumonia 2/15  . Shortness of breath     on exertion    Past Surgical History  Procedure Laterality Date  . Appendectomy    . Ectopic pregnancy surgery    . Knee arthroscopy  2010     Right knee  . Knee arthroscopy  2012    Left knee  . Total knee arthroplasty  11/22/2011    Procedure: TOTAL KNEE ARTHROPLASTY;  Surgeon: Drucilla SchmidtJames P Aplington, MD;  Location: [facility];  Service: Orthopedics;  Laterality: Left;    FAMHx:  Family History  Problem Relation Age of Onset  . Lung cancer Father   . COPD Mother   . Hypertension Mother   . Alcohol abuse Brother   . Melanoma Brother     SOCHx:   reports that she has been passively smoking.  She has never used smokeless tobacco. She reports that she drinks about .6 ounces of alcohol per week. She reports that she does not use illicit drugs.  ALLERGIES:  Allergies  Allergen Reactions  . Morphine And Related Anaphylaxis and Itching    ROS: A comprehensive review of systems was negative except for: Constitutional: positive for fatigue and weight gain Respiratory: positive for dyspnea on exertion Cardiovascular: positive for palpitations  HOME MEDS: Current Outpatient Prescriptions  Medication Sig Dispense Refill  . albuterol (PROVENTIL HFA;VENTOLIN HFA) 108 (90 BASE)  MCG/ACT inhaler Inhale 2 puffs into the lungs every 6 (six) hours as needed for wheezing or shortness of breath.       . calcium-vitamin D 250-100 MG-UNIT per tablet Take 1 tablet by mouth daily.      . cloNIDine (CATAPRES) 0.2 MG tablet Take 0.2 mg by mouth at bedtime.       . meclizine (ANTIVERT) 25 MG tablet Take 1 tablet (25 mg total) by mouth 3 (three) times daily as needed for dizziness.  12 tablet  0  . meloxicam (MOBIC) 15 MG tablet Take 15 mg by mouth every evening.      Maxwell Caul  Bicarbonate (ZEGERID OTC PO) Take 1 capsule by mouth every evening.      . traMADol (ULTRAM) 50 MG tablet Take 50 mg by mouth every 6 (six) hours as needed.       No current facility-administered medications for this visit.    LABS/IMAGING: No results found for this or any previous visit (from the past 48 hour(s)). No results found.  VITALS: BP 136/68  Ht 5\' 6"  (1.676 m)  Wt 237 lb (107.502 kg)  BMI 38.27 kg/m2  EXAM: General appearance: alert, no distress and moderately obese Neck: no carotid bruit and no JVD Lungs: diminished breath sounds bilaterally Heart: regular rate and rhythm, S1, S2 normal, no murmur, click, rub or gallop Abdomen: soft, non-tender; bowel sounds normal; no masses,  no organomegaly and obese Extremities: edema 1+ bilateral pitting edema, small varicosities Pulses: 2+ and symmetric Skin: Skin color, texture, turgor normal. No rashes or lesions Neurologic: Grossly normal Psych: Mood, affect normal  EKG: Deferred (EKG from 07/01/2013 was reviewed which shows sinus rhythm with an incomplete right bundle branch block)  ASSESSMENT: 1. Progressive dyspnea on exertion 2. Indeterminate preoperative risk 3. Malignant hypertension  PLAN: 1.   Kathryn Olsen has had progressive dyspnea on exertion and decreased exercise tolerance. This could be due to weight gain however could be related to coronary ischemia. She has had progressive increase in blood pressure to levels of will be considered malignant hypertension. This is improved on clonidine however she is having significant side effects from this medication. She is planning on knee surgery and will need preoperative risk stratification. I would recommend a LexiScan nuclear stress test as she is unable to walk on a treadmill. In addition she will need adjustments in her blood pressure medications which seem to be controlling her blood pressure, but are rocked with side effects. I would recommend discontinuing  her AZOR (which is expensive as well) and her HCTZ. I would like to start her on Benicar HCTZ 40/25 mg daily. She will be started back on amlodipine monotherapy 5 mg daily.  I've asked that she decrease her clonidine to 0.2 mg each bedtime. The plan is to try to wean her off of her clonidine if possible. I will arrange for followup with Belenda Cruise our hypertension specialist who can further adjust her medications.  I will plan to see her back after her stress test and can further advise on her risk for upcoming surgery.  Thanks for the kind referral.  Chrystie Nose, MD, [facility] Attending Cardiologist [facility]  Chrystie Nose 08/09/2013, 9:44 AM

## 2013-08-14 ENCOUNTER — Telehealth (HOSPITAL_COMMUNITY): Payer: Self-pay

## 2013-08-15 ENCOUNTER — Telehealth (HOSPITAL_COMMUNITY): Payer: Self-pay

## 2013-08-21 ENCOUNTER — Ambulatory Visit (HOSPITAL_COMMUNITY)
Admission: RE | Admit: 2013-08-21 | Discharge: 2013-08-21 | Disposition: A | Discharge status: Home / Self Care | Payer: Worker's Compensation | Source: Ambulatory Visit | Attending: Internal Medicine | Admitting: Internal Medicine

## 2013-08-21 DIAGNOSIS — R0600 Dyspnea, unspecified: Secondary | ICD-10-CM

## 2013-08-21 DIAGNOSIS — Z01810 Encounter for preprocedural cardiovascular examination: Secondary | ICD-10-CM

## 2013-08-21 DIAGNOSIS — R079 Chest pain, unspecified: Secondary | ICD-10-CM

## 2013-08-21 DIAGNOSIS — R0609 Other forms of dyspnea: Secondary | ICD-10-CM

## 2013-08-21 DIAGNOSIS — R0989 Other specified symptoms and signs involving the circulatory and respiratory systems: Secondary | ICD-10-CM | POA: Insufficient documentation

## 2013-08-21 DIAGNOSIS — I10 Essential (primary) hypertension: Secondary | ICD-10-CM | POA: Insufficient documentation

## 2013-08-21 MED ORDER — TECHNETIUM TC 99M SESTAMIBI GENERIC - CARDIOLITE
10.0000 | Freq: Once | INTRAVENOUS | Status: AC | PRN
  Administered 2013-08-21: 10 via INTRAVENOUS

## 2013-08-21 MED ORDER — REGADENOSON 0.4 MG/5ML IV SOLN
0.4000 mg | Freq: Once | INTRAVENOUS | Status: AC
  Administered 2013-08-21: 0.4 mg via INTRAVENOUS

## 2013-08-21 MED ORDER — TECHNETIUM TC 99M SESTAMIBI GENERIC - CARDIOLITE
30.0000 | Freq: Once | INTRAVENOUS | Status: AC | PRN
  Administered 2013-08-21: 30 via INTRAVENOUS

## 2013-08-21 MED ORDER — AMINOPHYLLINE 25 MG/ML IV SOLN
125.0000 mg | Freq: Once | INTRAVENOUS | Status: AC
  Administered 2013-08-21: 125 mg via INTRAVENOUS

## 2013-08-21 NOTE — Procedures (Addendum)
[place] [facility] [address] [place] [address] [place] [address] [phone]  Cardiology Nuclear Med Study  Kathryn Olsen is a 63 y.o. female     MRN : [id number]     DOB: 08-02-1950  Procedure Date: 08/21/2013  Nuclear Med Background Indication for Stress Test:  Surgical Clearance History:  Asthma and COPD;No prior cardiac history reported;No prior NUC MPI for comparison. Cardiac Risk Factors: Family History - CAD, Hypertension, Lipids and RBBB  Symptoms:  Chest Pain, Dizziness, Fatigue, Light-Headedness and Palpitations   Nuclear Pre-Procedure Caffeine/Decaff Intake:  7:00pm NPO After: 5:00am   IV Site: R Forearm  IV 0.9% NS with Angio Cath:  22g  Chest Size (in):  n/a IV Started by: Berdie Ogren, RN  Height: 5\' 6"  (1.676 m)  Cup Size: C  BMI:  Body mass index is 38.27 kg/(m^2). Weight:  237 lb (107.502 kg)   Tech Comments:  n/a     Nuclear Med Study 1 or 2 day study: 1 day  Stress Test Type:  Lexiscan  Order Authorizing Provider:  Zoila Shutter, MD   Resting Radionuclide: Technetium 61m Sestamibi  Resting Radionuclide Dose: 10.8 mCi   Stress Radionuclide:  Technetium 74m Sestamibi  Stress Radionuclide Dose: 30.8 mCi           Stress Protocol Rest HR: 81 Stress HR: 100  Rest BP: 183/96 Stress BP: 194/110  Exercise Time (min): n/a METS: n/a   Predicted Max HR: 155 bpm % Max HR: 64.52 bpm Rate Pressure Product: 03888  Dose of Adenosine (mg):  n/a Dose of Lexiscan: 0.4 mg  Dose of Atropine (mg): n/a Dose of Dobutamine: n/a mcg/kg/min (at max HR)  Stress Test Technologist: Esperanza Sheets, CCT Nuclear Technologist: Koren Shiver, CNMT   Rest Procedure:  Myocardial perfusion imaging was performed at rest 45 minutes following the intravenous administration of Technetium 41m Sestamibi. Stress Procedure:  The patient received IV Lexiscan 0.4 mg over 15-seconds.  Technetium 62m Sestamibi injected IV at 30-seconds.   Patient experienced SOB and Lightheadedness and 75 mg of Aminophylline IV was administered at 5 minutes.  There were no significant changes with Lexiscan.  Quantitative spect images were obtained after a 45 minute delay.  Transient Ischemic Dilatation (Normal <1.22):  1.07  QGS EDV:  85 ml QGS ESV:  31 ml LV Ejection Fraction: 63%       Rest ECG: NSR - Normal EKG  Stress ECG: No significant change from baseline ECG  QPS Raw Data Images:  Normal; no motion artifact; normal heart/lung ratio. Stress Images:  Normal homogeneous uptake in all areas of the myocardium. Rest Images:  Normal homogeneous uptake in all areas of the myocardium. Subtraction (SDS):  No evidence of ischemia.  Impression Exercise Capacity:  Lexiscan with no exercise. BP Response:  Hypertensive blood pressure response. Clinical Symptoms:  Mild SOB ECG Impression:  No significant ST segment change suggestive of ischemia. Comparison with Prior Nuclear Study: No images to compare  Overall Impression:  Normal stress nuclear study.  LV Wall Motion:  NL LV Function; NL Wall Motion   Runell Gess, MD  08/21/2013 5:35 PM

## 2013-09-09 ENCOUNTER — Encounter: Payer: Self-pay | Admitting: Internal Medicine

## 2013-09-09 ENCOUNTER — Ambulatory Visit (INDEPENDENT_AMBULATORY_CARE_PROVIDER_SITE_OTHER): Payer: Worker's Compensation | Admitting: Internal Medicine

## 2013-09-09 VITALS — BP 129/83 | HR 85 | Ht 66.0 in | Wt 239.6 lb

## 2013-09-09 DIAGNOSIS — R0609 Other forms of dyspnea: Secondary | ICD-10-CM | POA: Diagnosis not present

## 2013-09-09 DIAGNOSIS — R0602 Shortness of breath: Secondary | ICD-10-CM

## 2013-09-09 DIAGNOSIS — R0989 Other specified symptoms and signs involving the circulatory and respiratory systems: Secondary | ICD-10-CM

## 2013-09-09 DIAGNOSIS — E785 Hyperlipidemia, unspecified: Secondary | ICD-10-CM

## 2013-09-09 DIAGNOSIS — I10 Essential (primary) hypertension: Secondary | ICD-10-CM | POA: Diagnosis not present

## 2013-09-09 DIAGNOSIS — R0600 Dyspnea, unspecified: Secondary | ICD-10-CM

## 2013-09-09 NOTE — Progress Notes (Signed)
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  She is problems with her knees and had a left knee replacement in the past however it is giving her more issues. She was scheduled for a revision of that left knee and during her preoperative exam was noted to have marked malignant hypertension with a blood pressure of 220 systolic. She reports she had not taken her medications that day. Recently she's been having increased blood pressures and was started in the end of April on clonidine 0.2 mg 3 times a day by her primary care provider. She was taking this for several weeks and then her prescription ran out. She also lost her insurance coverage but has coverage began. She is now started retaking her clonidine, but notes significant dry mouth, fatigue and side effects which are treatable to the medication. Fortunately her blood pressure is better today at 136/68. Just a reports progressive increase in shortness of breath with exertion especially walking up stairs. She has had significant weight gain over the past year due to inactivity and problems with her knees. She denies any chest pain. There is a history of heart disease in her mother who had a heart attack and had COPD. She was also told that she has early findings of COPD but was never a smoker. She does report that she had some relief and wheezing which he gets periodically with a bronchodilator however that caused her some palpitations. She is referred for aggressive dyspnea, blood pressure control and preoperative cardiovascular risk assessment.  Kathryn Olsen underwent a nuclear stress test which was negative for ischemia. She seems to be tolerating adjustments are made in her medications, including reducing her clonidine to only 0.2 mg at night  instead of 3 times a day. I also changed her ARB to Benicar HCTZ. Her blood pressure is markedly improved today at 129/83, which is "the best blood pressure she seen in years".  She still reports some shortness of breath with exertion.  PMHx:  Past Medical History  Diagnosis Date  . Degeneration of intervertebral disc, site unspecified   . Dyslipidemia   . DJD (degenerative joint disease)   . Asthma   . COPD (chronic obstructive pulmonary disease)     questionable  . Depression   . Sleep apnea     stopbang=4  . Unspecified essential hypertension   . GERD (gastroesophageal reflux disease)   . Pneumonia 2/15  . Shortness of breath     on exertion    Past Surgical History  Procedure Laterality Date  . Appendectomy    . Ectopic pregnancy surgery    . Knee arthroscopy  2010     Right knee  . Knee arthroscopy  2012    Left knee  . Total knee arthroplasty  11/22/2011    Procedure: TOTAL KNEE ARTHROPLASTY;  Surgeon: Drucilla SchmidtJames P Aplington, MD;  Location: [facility];  Service: Orthopedics;  Laterality: Left;    FAMHx:  Family History  Problem Relation Age of Onset  . Lung cancer Father   . COPD Mother   . Hypertension Mother   . Alcohol abuse Brother   . Melanoma Brother     SOCHx:   reports that she has been passively smoking.  She has never used smokeless tobacco. She reports that she drinks about .6 ounces of  alcohol per week. She reports that she does not use illicit drugs.  ALLERGIES:  Allergies  Allergen Reactions  . Morphine And Related Anaphylaxis and Itching    ROS: A comprehensive review of systems was negative except for: Constitutional: positive for fatigue and weight gain Respiratory: positive for dyspnea on exertion Cardiovascular: positive for palpitations  HOME MEDS: Current Outpatient Prescriptions  Medication Sig Dispense Refill  . albuterol (PROVENTIL HFA;VENTOLIN HFA) 108 (90 BASE) MCG/ACT inhaler Inhale 2 puffs into the lungs every 6 (six) hours as needed  for wheezing or shortness of breath.       [name] Kitchen. amLODipine (NORVASC) 5 MG tablet Take 1 tablet (5 mg total) by mouth daily.  30 tablet  6  . calcium-vitamin D 250-100 MG-UNIT per tablet Take 1 tablet by mouth daily.      . cloNIDine (CATAPRES) 0.2 MG tablet Take 0.2 mg by mouth at bedtime.       . meclizine (ANTIVERT) 25 MG tablet Take 1 tablet (25 mg total) by mouth 3 (three) times daily as needed for dizziness.  12 tablet  0  . meloxicam (MOBIC) 15 MG tablet Take 15 mg by mouth every evening.      . olmesartan-hydrochlorothiazide (BENICAR HCT) 40-25 MG per tablet Take 1 tablet by mouth daily.  30 tablet  6  . Omeprazole-Sodium Bicarbonate (ZEGERID OTC PO) Take 1 capsule by mouth every evening.      . traMADol (ULTRAM) 50 MG tablet Take 50 mg by mouth every 6 (six) hours as needed.       No current facility-administered medications for this visit.    LABS/IMAGING: No results found for this or any previous visit (from the past 48 hour(s)). No results found.  VITALS: BP 129/83  Pulse 85  Ht 5\' 6"  (1.676 m)  Wt 239 lb 9.6 oz (108.682 kg)  BMI 38.69 kg/m2  EXAM: deferred  EKG: Deferred (EKG from 07/01/2013 was reviewed which shows sinus rhythm with an incomplete right bundle branch block)  ASSESSMENT: 1. Progressive dyspnea on exertion - low risk myoview 2. Indeterminate preoperative risk 3. Malignant hypertension - now at goal with medication adjustment  PLAN: 1.   Kathryn Olsen has had progressive dyspnea on exertion and decreased exercise tolerance. Her nuclear stress test was negative for ischemia. I suspect that her issues are mostly pulmonary in nature. There were some early findings of COPD based on pulmonary function testing in the past. Although she has not been a smoker. I would recommend referral to pulmonary (Dr. Kendrick FriesMcQuaid) for further pulmonary evaluation of her dyspnea. Her hypertension is now improved on this new medication regimen and her fatigue has lessened  somewhat on a lower dose of clonidine. He may be able to work in the future to get her off of clonidine completely.  Thanks again for the kind referral.  Chrystie NoseKenneth C. Hilty, MD, [facility] Attending Cardiologist [facility]  HILTY,Kenneth C 09/09/2013, 2:07 PM

## 2013-09-09 NOTE — Patient Instructions (Signed)
Your physician recommends that you schedule a follow-up appointment in: 6 Months  [facility]

## 2013-09-10 ENCOUNTER — Telehealth: Payer: Self-pay | Admitting: Internal Medicine

## 2013-09-10 NOTE — Telephone Encounter (Signed)
Informed patient of appointment with [facility] [address]---2nd floor---Monday 09/30/13 @ 3:30.  Patient was also given phone # [phone].  Patient voiced understanding.

## 2013-09-11 ENCOUNTER — Telehealth: Payer: Self-pay | Admitting: Internal Medicine

## 2013-09-11 NOTE — Telephone Encounter (Signed)
Will give surgical clearance to Dr. Rennis GoldenHilty 7/2 when he is in office.

## 2013-09-11 NOTE — Telephone Encounter (Signed)
Encounter complete. 

## 2013-09-12 ENCOUNTER — Telehealth: Payer: Self-pay | Admitting: *Deleted

## 2013-09-12 NOTE — Telephone Encounter (Signed)
Faxed cardiac clearance - Dr. Luna FuseM. Olin - AttnL Rosalva FerronSherry Wills - left knee revision LTKA poly exchange, TK-revision w/wo allograft one component

## 2013-09-19 NOTE — Progress Notes (Signed)
Chest x ray, ekg 4/15 epic,  Stress test 6/15.  LOV Dr Rennis GoldenHilty 6/15 EPIC

## 2013-09-19 NOTE — Progress Notes (Signed)
Need orders in EPIC.  Surgery on 09/23/13.  Preop on 7/10 at 1100am.

## 2013-09-20 ENCOUNTER — Encounter (HOSPITAL_COMMUNITY): Payer: Self-pay

## 2013-09-20 ENCOUNTER — Other Ambulatory Visit (HOSPITAL_COMMUNITY): Payer: Self-pay | Admitting: *Deleted

## 2013-09-20 ENCOUNTER — Encounter (HOSPITAL_COMMUNITY): Payer: Self-pay | Admitting: Pharmacy Technician

## 2013-09-20 ENCOUNTER — Encounter (HOSPITAL_COMMUNITY)
Admission: RE | Admit: 2013-09-20 | Discharge: 2013-09-20 | Disposition: A | Discharge status: Home / Self Care | Payer: Worker's Compensation | Source: Ambulatory Visit | Attending: Orthopedic Surgery | Admitting: Orthopedic Surgery

## 2013-09-20 DIAGNOSIS — Z01812 Encounter for preprocedural laboratory examination: Secondary | ICD-10-CM | POA: Insufficient documentation

## 2013-09-20 LAB — BASIC METABOLIC PANEL
Anion gap: 11 (ref 5–15)
BUN: 17 mg/dL (ref 6–23)
CO2: 27 mEq/L (ref 19–32)
Calcium: 9.5 mg/dL (ref 8.4–10.5)
Chloride: 106 mEq/L (ref 96–112)
Creatinine, Ser: 0.77 mg/dL (ref 0.50–1.10)
GFR calc Af Amer: >90 mL/min (ref >90)
GFR calc non Af Amer: 86 mL/min — ABNORMAL LOW (ref >90)
Glucose, Bld: 88 mg/dL (ref 70–99)
Potassium: 4.1 mEq/L (ref 3.7–5.3)
Sodium: 144 mEq/L (ref 137–147)

## 2013-09-20 LAB — URINALYSIS, ROUTINE W REFLEX MICROSCOPIC
Bilirubin Urine: NEGATIVE
Glucose, UA: NEGATIVE mg/dL
Hgb urine dipstick: NEGATIVE
Ketones, ur: NEGATIVE mg/dL
Leukocytes, UA: NEGATIVE
Nitrite: NEGATIVE
Protein, ur: NEGATIVE mg/dL
Specific Gravity, Urine: 1.006 (ref 1.005–1.030)
Urobilinogen, UA: 0.2 mg/dL (ref 0.0–1.0)
pH: 7 (ref 5.0–8.0)

## 2013-09-20 LAB — CBC
HCT: 42.8 % (ref 36.0–46.0)
Hemoglobin: 13.5 g/dL (ref 12.0–15.0)
MCH: 26.7 pg (ref 26.0–34.0)
MCHC: 31.5 g/dL (ref 30.0–36.0)
MCV: 84.6 fL (ref 78.0–100.0)
Platelets: 248 10*3/uL (ref 150–400)
RBC: 5.06 MIL/uL (ref 3.87–5.11)
RDW: 14.7 % (ref 11.5–15.5)
WBC: 7.6 10*3/uL (ref 4.0–10.5)

## 2013-09-20 LAB — SURGICAL PCR SCREEN
MRSA, PCR: NEGATIVE
Staphylococcus aureus: NEGATIVE

## 2013-09-20 LAB — APTT: aPTT: 34 seconds (ref 24–37)

## 2013-09-20 LAB — PROTIME-INR
INR: 1.05 (ref 0.00–1.49)
Prothrombin Time: 13.7 seconds (ref 11.6–15.2)

## 2013-09-20 NOTE — Progress Notes (Signed)
09/20/13 1149  OBSTRUCTIVE SLEEP APNEA  Have you ever been diagnosed with sleep apnea through a sleep study? No  Do you snore loudly (loud enough to be heard through closed doors)?  1  Do you often feel tired, fatigued, or sleepy during the daytime? 1  Has anyone observed you stop breathing during your sleep? 0  Do you have, or are you being treated for high blood pressure? 1  BMI more than 35 kg/m2? 1  Age over 65 years old? 1  Neck circumference greater than 40 cm/16 inches? 0  Gender: 0  Obstructive Sleep Apnea Score 5  Score 4 or greater  Results sent to PCP

## 2013-09-20 NOTE — Patient Instructions (Signed)
20     Your procedure is scheduled on:  Monday 09/23/2013  Report to [facility] Main Entrance and follow signs to Short Stay  at  1145 AM.  Call this number if you have problems the night before or morning of surgery:   [phone]   Remember:             IF YOU USE CPAP,BRING MASK AND TUBING AM OF SURGERY!             IF YOU DO NOT HAVE YOUR TYPE AND SCREEN DRAWN AT PRE-ADMIT APPOINTMENT, YOU WILL HAVE IT DRAWN AM OF SURGERY!   Do not eat food  AFTER MIDNIGHT! MAY HAVE CLEAR LIQUIDS FROM MIDNIGHT UP UNTIL 0745 AM THE MORNING OF SURGERY THEN NOTHING UNTIL AFTER SURGERY!  Take these medicines the morning of surgery with A SIP OF WATER: Clonidine, Amlodipine    [facility] IS NOT RESPONSIBLE FOR ANY BELONGINGS OR VALUABLES BROUGHT TO HOSPITAL.  [name] Kitchen  Leave suitcase in the car. After surgery it may be brought to your room.  For patients admitted to the hospital, checkout time is 11:00 AM the day of              Discharge.    DO NOT WEAR JEWELRY,MAKE-UP,LOTIONS,POWDERS,PERFUMES,CONTACTS , DENTURES OR BRIDGEWORK ,AND DO NOT WEAR FALSE EYELASHES                                    Patients discharged the day of surgery will not be allowed to drive home.   If going home the same day of surgery, must have someone stay with you   first 24 hrs.at home and arrange for someone to drive you home from the  Hospital.                         YOUR DRIVER IS: N/A   Special Instructions:              Please read over the following fact sheets that you were given:             1. [place] PREPARING FOR SURGERY SHEET              2.[facility]OMETRY                                        [place].[facility]     [phone]                              [facility] - Preparing for Surgery Before surgery, you can play an important role.  Because skin is not sterile, your skin needs to be as free of germs as possible.  You can reduce the number of germs on your skin by washing with CHG  (chlorahexidine gluconate) soap before surgery.  CHG is an antiseptic cleaner which kills germs and bonds with the skin to continue killing germs even after washing. Please DO NOT use if you have an allergy to CHG or antibacterial soaps.  If your skin becomes reddened/irritated stop using the CHG and inform your nurse when you arrive at Short Stay. Do not shave (including legs and underarms) for at least 48 hours prior to the first  CHG shower.  You may shave your face/neck. Please follow these instructions carefully:  1.  Shower with CHG Soap the night before surgery and the  morning of Surgery.  2.  If you choose to wash your hair, wash your hair first as usual with your  normal  shampoo.  3.  After you shampoo, rinse your hair and body thoroughly to remove the  shampoo.                           4.  Use CHG as you would any other liquid soap.  You can apply chg directly  to the skin and wash                       Gently with a scrungie or clean washcloth.  5.  Apply the CHG Soap to your body ONLY FROM THE NECK DOWN.   Do not use on face/ open                           Wound or open sores. Avoid contact with eyes, ears mouth and genitals (private parts).                       Wash face,  Genitals (private parts) with your normal soap.             6.  Wash thoroughly, paying special attention to the area where your surgery  will be performed.  7.  Thoroughly rinse your body with warm water from the neck down.  8.  DO NOT shower/wash with your normal soap after using and rinsing off  the CHG Soap.                9.  Pat yourself dry with a clean towel.            10.  Wear clean pajamas.            11.  Place clean sheets on your bed the night of your first shower and do not  sleep with pets. Day of Surgery : Do not apply any lotions/deodorants the morning of surgery.  Please wear clean clothes to the hospital/surgery center.  FAILURE TO FOLLOW THESE INSTRUCTIONS MAY RESULT IN THE CANCELLATION OF  YOUR SURGERY PATIENT SIGNATURE_________________________________  NURSE SIGNATURE__________________________________  ________________________________________________________________________   Rogelia MireIncentive Spirometer  An incentive spirometer is a tool that can help keep your lungs clear and active. This tool measures how well you are filling your lungs with each breath. Taking long deep breaths may help reverse or decrease the chance of developing breathing (pulmonary) problems (especially infection) following:  A long period of time when you are unable to move or be active. BEFORE THE PROCEDURE   If the spirometer includes an indicator to show your best effort, your nurse or respiratory therapist will set it to a desired goal.  If possible, sit up straight or lean slightly forward. Try not to slouch.  Hold the incentive spirometer in an upright position. INSTRUCTIONS FOR USE  1. Sit on the edge of your bed if possible, or sit up as far as you can in bed or on a chair. 2. Hold the incentive spirometer in an upright position. 3. Breathe out normally. 4. Place the mouthpiece in your mouth and seal your lips tightly around it. 5. Breathe in slowly and as deeply  as possible, raising the piston or the ball toward the top of the column. 6. Hold your breath for 3-5 seconds or for as long as possible. Allow the piston or ball to fall to the bottom of the column. 7. Remove the mouthpiece from your mouth and breathe out normally. 8. Rest for a few seconds and repeat Steps 1 through 7 at least 10 times every 1-2 hours when you are awake. Take your time and take a few normal breaths between deep breaths. 9. The spirometer may include an indicator to show your best effort. Use the indicator as a goal to work toward during each repetition. 10. After each set of 10 deep breaths, practice coughing to be sure your lungs are clear. If you have an incision (the cut made at the time of surgery), support your  incision when coughing by placing a pillow or rolled up towels firmly against it. Once you are able to get out of bed, walk around indoors and cough well. You may stop using the incentive spirometer when instructed by your caregiver.  RISKS AND COMPLICATIONS  Take your time so you do not get dizzy or light-headed.  If you are in pain, you may need to take or ask for pain medication before doing incentive spirometry. It is harder to take a deep breath if you are having pain. AFTER USE  Rest and breathe slowly and easily.  It can be helpful to keep track of a log of your progress. Your caregiver can provide you with a simple table to help with this. If you are using the spirometer at home, follow these instructions: SEEK MEDICAL CARE IF:   You are having difficultly using the spirometer.  You have trouble using the spirometer as often as instructed.  Your pain medication is not giving enough relief while using the spirometer.  You develop fever of 100.5 F (38.1 C) or higher. SEEK IMMEDIATE MEDICAL CARE IF:   You cough up bloody sputum that had not been present before.  You develop fever of 102 F (38.9 C) or greater.  You develop worsening pain at or near the incision site. MAKE SURE YOU:   Understand these instructions.  Will watch your condition.  Will get help right away if you are not doing well or get worse. Document Released: 07/11/2006 Document Revised: 05/23/2011 Document Reviewed: 09/11/2006 ExitCare Patient Information 2014 ExitCare, [place].   ________________________________________________________________________  WHAT IS A BLOOD TRANSFUSION? Blood Transfusion Information  A transfusion is the replacement of blood or some of its parts. Blood is made up of multiple cells which provide different functions.  Red blood cells carry oxygen and are used for blood loss replacement.  White blood cells fight against infection.  Platelets control bleeding.  Plasma  helps clot blood.  Other blood products are available for specialized needs, such as hemophilia or other clotting disorders. BEFORE THE TRANSFUSION  Who gives blood for transfusions?   Healthy volunteers who are fully evaluated to make sure their blood is safe. This is blood bank blood. Transfusion therapy is the safest it has ever been in the practice of medicine. Before blood is taken from a donor, a complete history is taken to make sure that person has no history of diseases nor engages in risky social behavior (examples are intravenous drug use or sexual activity with multiple partners). The donor's travel history is screened to minimize risk of transmitting infections, such as malaria. The donated blood is tested for signs of infectious diseases,  such as HIV and hepatitis. The blood is then tested to be sure it is compatible with you in order to minimize the chance of a transfusion reaction. If you or a relative donates blood, this is often done in anticipation of surgery and is not appropriate for emergency situations. It takes many days to process the donated blood. RISKS AND COMPLICATIONS Although transfusion therapy is very safe and saves many lives, the main dangers of transfusion include:   Getting an infectious disease.  Developing a transfusion reaction. This is an allergic reaction to something in the blood you were given. Every precaution is taken to prevent this. The decision to have a blood transfusion has been considered carefully by your caregiver before blood is given. Blood is not given unless the benefits outweigh the risks. AFTER THE TRANSFUSION  Right after receiving a blood transfusion, you will usually feel much better and more energetic. This is especially true if your red blood cells have gotten low (anemic). The transfusion raises the level of the red blood cells which carry oxygen, and this usually causes an energy increase.  The nurse administering the transfusion  will monitor you carefully for complications. HOME CARE INSTRUCTIONS  No special instructions are needed after a transfusion. You may find your energy is better. Speak with your caregiver about any limitations on activity for underlying diseases you may have. SEEK MEDICAL CARE IF:   Your condition is not improving after your transfusion.  You develop redness or irritation at the intravenous (IV) site. SEEK IMMEDIATE MEDICAL CARE IF:  Any of the following symptoms occur over the next 12 hours:  Shaking chills.  You have a temperature by mouth above 102 F (38.9 C), not controlled by medicine.  Chest, back, or muscle pain.  People around you feel you are not acting correctly or are confused.  Shortness of breath or difficulty breathing.  Dizziness and fainting.  You get a rash or develop hives.  You have a decrease in urine output.  Your urine turns a dark color or changes to pink, red, or brown. Any of the following symptoms occur over the next 10 days:  You have a temperature by mouth above 102 F (38.9 C), not controlled by medicine.  Shortness of breath.  Weakness after normal activity.  The white part of the eye turns yellow (jaundice).  You have a decrease in the amount of urine or are urinating less often.  Your urine turns a dark color or changes to pink, red, or brown. Document Released: 02/26/2000 Document Revised: 05/23/2011 Document Reviewed: 10/15/2007 [facility] Patient Information 2014 [place], [place].  _______________________________________________________________________

## 2013-09-22 NOTE — H&P (Signed)
TOTAL KNEE REVISION ADMISSION H&P  Patient is being admitted for left total knee revision.  Subjective:  Chief Complaint:   Left knee pain s/p TKA  HPI: Kathryn Olsen, 63 y.o. 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 The indications for the revision of the total knee arthroplasty are pain since the beginning and stiffness. Onset of symptoms was gradual starting 2 years ago with gradually worsening course since that time.  Prior procedures on the left knee(s) include arthroplasty.  Patient currently rates pain in the left knee(s) at 9 out of 10 with activity. There is night pain, worsening of pain with activity and weight bearing, pain that interferes with activities of daily living, pain with passive range of motion and joint swelling.  Patient has evidence of previous TKA by imaging studies. This condition presents safety issues increasing the risk of falls. There is no current active infection.  Risks, benefits and expectations were discussed with the patient.  Risks including but not limited to the risk of anesthesia, blood clots, nerve damage, blood vessel damage, failure of the prosthesis, infection and up to and including death.  Patient understand the risks, benefits and expectations and wishes to proceed with surgery.   PCP: Verlon AuBoyd, Tammy Lamonica, MD  D/C Plans:      Home with HHPT/SNF  Post-op Meds:       No Rx given  Tranexamic Acid:      Given via IV  Decadron:      Is to be given  FYI:     Check pain meds.  ASA post-op  PREVIOUS COMPONENTS:  Osteonics left total knee replacement:    size 9 femur  size 7 tibia with an 80 mm. x 14 mm extension  12 mm posterior stabilized  insert  28 patella   Patient Active Problem List   Diagnosis Date Noted  . DOE (dyspnea on exertion) 08/09/2013  . Preoperative cardiovascular examination 08/09/2013  . Pre-operative cardiovascular examination 08/09/2013  . Chest pain 08/09/2013  . Dyspnea 08/09/2013  . Uncontrolled hypertension 08/09/2013  . Nonspecific (abnormal) findings on radiological and other examination of gastrointestinal tract 04/26/2011  . Esophageal reflux 04/26/2011  . Chest pain, unspecified 04/26/2011  . Special screening for malignant neoplasms, colon 04/26/2011  . DYSLIPIDEMIA 09/03/2008  . Malignant hypertension 09/03/2008  . DEGENERATIVE DISC DISEASE 09/03/2008   Past Medical History  Diagnosis Date  . Degeneration of intervertebral disc, site unspecified   . Dyslipidemia   . DJD (degenerative joint disease)   . Asthma   . COPD (chronic obstructive pulmonary disease)     questionable  . Depression   . Sleep apnea     stopbang=4  . Unspecified essential hypertension   . GERD (gastroesophageal reflux disease)   . Pneumonia 2/15  . Shortness of breath     on exertion    Past Surgical History  Procedure Laterality Date  . Appendectomy    . Ectopic pregnancy surgery    . Knee arthroscopy  2010     Right knee  . Knee arthroscopy  2012    Left knee  . Total knee arthroplasty  11/22/2011    Procedure: TOTAL KNEE ARTHROPLASTY;  Surgeon: Drucilla SchmidtJames P Aplington, MD;  Location: [facility];  Service: Orthopedics;  Laterality: Left;    No prescriptions prior to admission   Allergies  Allergen Reactions  . Morphine And Related Anaphylaxis and Itching    History  Substance Use Topics  . Smoking status: Passive Smoke Exposure - Never Smoker  . Smokeless tobacco: Never Used  . Alcohol Use: 0.6 oz/week    1 Glasses of wine per week     Comment: occasional glass of wine    Family History  Problem Relation Age of Onset  . Lung cancer Father   . COPD Mother   . Hypertension Mother   . Alcohol  abuse Brother   . Melanoma Brother      Review of Systems  Constitutional: Negative.   HENT: Negative.   Eyes: Negative.   Respiratory: Positive for shortness of breath (on exertion).   Cardiovascular: Negative.   Gastrointestinal: Positive for heartburn.  Genitourinary: Negative.   Musculoskeletal: Positive for joint pain.  Skin: Negative.   Neurological: Negative.   Endo/Heme/Allergies: Negative.   Psychiatric/Behavioral: Positive for depression.     Objective:  Physical Exam  Constitutional: She is oriented to person, place, and time. She appears well-developed and well-nourished.  HENT:  Head: Normocephalic and atraumatic.  Mouth/Throat: Oropharynx is clear and moist.  Eyes: Pupils are equal, round, and reactive to light.  Neck: Neck supple. No JVD present. No tracheal deviation present. No thyromegaly present.  Cardiovascular: Normal rate, regular rhythm and intact distal pulses.   Respiratory: Effort normal. No respiratory distress.  GI: Soft. There is no tenderness. There is no guarding.  Musculoskeletal:       Left knee: She exhibits decreased range of motion, swelling, laceration (healed) and bony tenderness. She exhibits no ecchymosis, no deformity and no erythema. Tenderness found.  Lymphadenopathy:    She has no cervical adenopathy.  Neurological: She is alert and oriented to person, place, and time.  Skin: Skin is warm and dry.  Psychiatric: She has a normal mood and affect.     Labs:  Estimated body mass index is 38.69 kg/(m^2) as calculated from the following:   Height as of 09/09/13: 5\' 6"  (1.676 m).   Weight as of 09/09/13: 108.682 kg (239 lb 9.6 oz).  Imaging Review Plain radiographs demonstrate previous total knee arthroplasty of the left knee(s). The overall alignment is neutral. The bone quality appears to be good for age and reported activity level.   Assessment/Plan:  Left knee with failed previous arthroplasty.   The patient history,  physical examination, clinical judgment of the provider and imaging studies are consistent with failed TKA of the left knee. Revision total knee arthroplasty is deemed medically necessary. The treatment options including medical management, injection therapy, arthroscopy and revision arthroplasty were discussed at length. The risks and benefits of revision total knee arthroplasty were presented and reviewed. The risks due to aseptic loosening, infection, stiffness, patella tracking problems, thromboembolic complications and other imponderables were discussed. The patient acknowledged the explanation, agreed to proceed with the plan and consent was signed. Patient is being admitted for inpatient treatment for surgery, pain control, PT, OT, prophylactic antibiotics, VTE prophylaxis, progressive ambulation and ADL's and discharge planning.The patient is planning to be discharged to skilled nursing facility/home.      Anastasio Auerbach Tametria Aho   PA-C  09/22/2013, 9:35 AM

## 2013-09-23 ENCOUNTER — Inpatient Hospital Stay (HOSPITAL_COMMUNITY)
Admission: RE | Admit: 2013-09-23 | Discharge: 2013-09-24 | DRG: 468 | Disposition: A | Discharge status: Home Health | Payer: Worker's Compensation | Source: Ambulatory Visit | Attending: Orthopedic Surgery | Admitting: Orthopedic Surgery

## 2013-09-23 ENCOUNTER — Encounter (HOSPITAL_COMMUNITY): Payer: Self-pay | Admitting: *Deleted

## 2013-09-23 ENCOUNTER — Encounter (HOSPITAL_COMMUNITY): Payer: Worker's Compensation | Admitting: Anesthesiology

## 2013-09-23 ENCOUNTER — Encounter (HOSPITAL_COMMUNITY)
Admission: RE | Disposition: A | Discharge status: Home Health | Payer: Self-pay | Source: Ambulatory Visit | Attending: Orthopedic Surgery

## 2013-09-23 ENCOUNTER — Inpatient Hospital Stay (HOSPITAL_COMMUNITY): Payer: Worker's Compensation | Admitting: Anesthesiology

## 2013-09-23 DIAGNOSIS — Z96659 Presence of unspecified artificial knee joint: Secondary | ICD-10-CM

## 2013-09-23 DIAGNOSIS — J449 Chronic obstructive pulmonary disease, unspecified: Secondary | ICD-10-CM | POA: Diagnosis present

## 2013-09-23 DIAGNOSIS — Z8249 Family history of ischemic heart disease and other diseases of the circulatory system: Secondary | ICD-10-CM | POA: Diagnosis not present

## 2013-09-23 DIAGNOSIS — Z96652 Presence of left artificial knee joint: Secondary | ICD-10-CM

## 2013-09-23 DIAGNOSIS — M2420 Disorder of ligament, unspecified site: Secondary | ICD-10-CM | POA: Diagnosis present

## 2013-09-23 DIAGNOSIS — IMO0002 Reserved for concepts with insufficient information to code with codable children: Secondary | ICD-10-CM | POA: Diagnosis present

## 2013-09-23 DIAGNOSIS — G4730 Sleep apnea, unspecified: Secondary | ICD-10-CM | POA: Diagnosis present

## 2013-09-23 DIAGNOSIS — Z801 Family history of malignant neoplasm of trachea, bronchus and lung: Secondary | ICD-10-CM | POA: Diagnosis not present

## 2013-09-23 DIAGNOSIS — Z808 Family history of malignant neoplasm of other organs or systems: Secondary | ICD-10-CM | POA: Diagnosis not present

## 2013-09-23 DIAGNOSIS — Z836 Family history of other diseases of the respiratory system: Secondary | ICD-10-CM

## 2013-09-23 DIAGNOSIS — I10 Essential (primary) hypertension: Secondary | ICD-10-CM | POA: Diagnosis present

## 2013-09-23 DIAGNOSIS — K219 Gastro-esophageal reflux disease without esophagitis: Secondary | ICD-10-CM | POA: Diagnosis present

## 2013-09-23 DIAGNOSIS — M25569 Pain in unspecified knee: Secondary | ICD-10-CM | POA: Diagnosis present

## 2013-09-23 DIAGNOSIS — J4489 Other specified chronic obstructive pulmonary disease: Secondary | ICD-10-CM | POA: Diagnosis present

## 2013-09-23 DIAGNOSIS — Y831 Surgical operation with implant of artificial internal device as the cause of abnormal reaction of the patient, or of later complication, without mention of misadventure at the time of the procedure: Secondary | ICD-10-CM | POA: Diagnosis present

## 2013-09-23 DIAGNOSIS — E785 Hyperlipidemia, unspecified: Secondary | ICD-10-CM | POA: Diagnosis present

## 2013-09-23 DIAGNOSIS — Z885 Allergy status to narcotic agent status: Secondary | ICD-10-CM

## 2013-09-23 DIAGNOSIS — M1990 Unspecified osteoarthritis, unspecified site: Secondary | ICD-10-CM | POA: Diagnosis present

## 2013-09-23 DIAGNOSIS — T84099A Other mechanical complication of unspecified internal joint prosthesis, initial encounter: Principal | ICD-10-CM | POA: Diagnosis present

## 2013-09-23 DIAGNOSIS — M25469 Effusion, unspecified knee: Secondary | ICD-10-CM | POA: Diagnosis present

## 2013-09-23 HISTORY — PX: TOTAL KNEE REVISION: SHX996

## 2013-09-23 LAB — TYPE AND SCREEN
ABO/RH(D): O POS
Antibody Screen: NEGATIVE

## 2013-09-23 SURGERY — TOTAL KNEE REVISION
Anesthesia: General | Site: Knee | Laterality: Left

## 2013-09-23 MED ORDER — CELECOXIB 200 MG PO CAPS
200.0000 mg | ORAL_CAPSULE | Freq: Two times a day (BID) | ORAL | Status: DC
  Administered 2013-09-23 – 2013-09-24 (×2): 200 mg via ORAL
  Filled 2013-09-23 (×3): qty 1

## 2013-09-23 MED ORDER — ALBUTEROL SULFATE (2.5 MG/3ML) 0.083% IN NEBU: 3.0000 mL | INHALATION_SOLUTION | Freq: Four times a day (QID) | RESPIRATORY_TRACT | Status: DC | PRN

## 2013-09-23 MED ORDER — POLYETHYLENE GLYCOL 3350 17 G PO PACK
17.0000 g | PACK | Freq: Two times a day (BID) | ORAL | Status: DC
  Administered 2013-09-23 – 2013-09-24 (×2): 17 g via ORAL

## 2013-09-23 MED ORDER — LIDOCAINE HCL (CARDIAC) 20 MG/ML IV SOLN
INTRAVENOUS | Status: DC | PRN
  Administered 2013-09-23: 50 mg via INTRAVENOUS

## 2013-09-23 MED ORDER — PROPOFOL 10 MG/ML IV BOLUS
INTRAVENOUS | Status: AC
  Filled 2013-09-23: qty 20

## 2013-09-23 MED ORDER — CEFAZOLIN SODIUM-DEXTROSE 2-3 GM-% IV SOLR
2.0000 g | Freq: Four times a day (QID) | INTRAVENOUS | Status: AC
  Administered 2013-09-23 – 2013-09-24 (×2): 2 g via INTRAVENOUS
  Filled 2013-09-23 (×2): qty 50

## 2013-09-23 MED ORDER — FERROUS SULFATE 325 (65 FE) MG PO TABS
325.0000 mg | ORAL_TABLET | Freq: Three times a day (TID) | ORAL | Status: DC
  Administered 2013-09-24 (×2): 325 mg via ORAL
  Filled 2013-09-23 (×4): qty 1

## 2013-09-23 MED ORDER — MIDAZOLAM HCL 2 MG/2ML IJ SOLN
INTRAMUSCULAR | Status: AC
  Filled 2013-09-23: qty 2

## 2013-09-23 MED ORDER — DEXAMETHASONE SODIUM PHOSPHATE 10 MG/ML IJ SOLN
INTRAMUSCULAR | Status: DC | PRN
  Administered 2013-09-23: 10 mg via INTRAVENOUS

## 2013-09-23 MED ORDER — METHOCARBAMOL 500 MG PO TABS
500.0000 mg | ORAL_TABLET | Freq: Four times a day (QID) | ORAL | Status: DC | PRN
  Administered 2013-09-23: 500 mg via ORAL
  Filled 2013-09-23: qty 1

## 2013-09-23 MED ORDER — CEFAZOLIN SODIUM-DEXTROSE 2-3 GM-% IV SOLR
INTRAVENOUS | Status: AC
  Filled 2013-09-23: qty 50

## 2013-09-23 MED ORDER — ROCURONIUM BROMIDE 100 MG/10ML IV SOLN
INTRAVENOUS | Status: AC
  Filled 2013-09-23: qty 1

## 2013-09-23 MED ORDER — SODIUM CHLORIDE 0.9 % IR SOLN
Status: DC | PRN
  Administered 2013-09-23: 2

## 2013-09-23 MED ORDER — DEXAMETHASONE SODIUM PHOSPHATE 10 MG/ML IJ SOLN: 10.0000 mg | Freq: Once | INTRAMUSCULAR | Status: DC

## 2013-09-23 MED ORDER — SODIUM CHLORIDE 0.9 % IJ SOLN
INTRAMUSCULAR | Status: AC
Start: 2013-09-23 — End: 2013-09-23
  Filled 2013-09-23: qty 10

## 2013-09-23 MED ORDER — ONDANSETRON HCL 4 MG/2ML IJ SOLN
4.0000 mg | Freq: Four times a day (QID) | INTRAMUSCULAR | Status: DC | PRN
Start: 2013-09-23 — End: 2013-09-24

## 2013-09-23 MED ORDER — BUPIVACAINE-EPINEPHRINE (PF) 0.25% -1:200000 IJ SOLN
INTRAMUSCULAR | Status: DC | PRN
  Administered 2013-09-23: 30 mL

## 2013-09-23 MED ORDER — MIDAZOLAM HCL 2 MG/2ML IJ SOLN
0.5000 mg | INTRAMUSCULAR | Status: DC
  Administered 2013-09-23: 0.5 mg via INTRAVENOUS

## 2013-09-23 MED ORDER — CEFAZOLIN SODIUM-DEXTROSE 2-3 GM-% IV SOLR
2.0000 g | INTRAVENOUS | Status: AC
  Administered 2013-09-23: 2 g via INTRAVENOUS

## 2013-09-23 MED ORDER — FENTANYL CITRATE 0.05 MG/ML IJ SOLN
INTRAMUSCULAR | Status: AC
  Filled 2013-09-23: qty 5

## 2013-09-23 MED ORDER — HYDROMORPHONE HCL PF 1 MG/ML IJ SOLN
INTRAMUSCULAR | Status: DC | PRN
  Administered 2013-09-23 (×2): 1 mg via INTRAVENOUS

## 2013-09-23 MED ORDER — OMEPRAZOLE-SODIUM BICARBONATE 20-1100 MG PO CAPS: 1.0000 | ORAL_CAPSULE | Freq: Every day | ORAL | Status: DC

## 2013-09-23 MED ORDER — METHOCARBAMOL 1000 MG/10ML IJ SOLN
500.0000 mg | Freq: Four times a day (QID) | INTRAVENOUS | Status: DC | PRN
  Administered 2013-09-23: 500 mg via INTRAVENOUS
  Filled 2013-09-23: qty 5

## 2013-09-23 MED ORDER — METOCLOPRAMIDE HCL 10 MG PO TABS: 5.0000 mg | ORAL_TABLET | Freq: Three times a day (TID) | ORAL | Status: DC | PRN

## 2013-09-23 MED ORDER — SODIUM CHLORIDE 0.9 % IV SOLN
INTRAVENOUS | Status: DC
  Administered 2013-09-24: 02:00:00 via INTRAVENOUS
  Filled 2013-09-23 (×8): qty 1000

## 2013-09-23 MED ORDER — NEOSTIGMINE METHYLSULFATE 10 MG/10ML IV SOLN
INTRAVENOUS | Status: DC | PRN
  Administered 2013-09-23: 3 mg via INTRAVENOUS

## 2013-09-23 MED ORDER — ACETAMINOPHEN 10 MG/ML IV SOLN
1000.0000 mg | Freq: Once | INTRAVENOUS | Status: AC
  Administered 2013-09-23: 1000 mg via INTRAVENOUS
  Filled 2013-09-23: qty 100

## 2013-09-23 MED ORDER — HYDROMORPHONE HCL PF 1 MG/ML IJ SOLN
INTRAMUSCULAR | Status: AC
  Filled 2013-09-23: qty 2

## 2013-09-23 MED ORDER — OMEPRAZOLE 20 MG PO CPDR
20.0000 mg | DELAYED_RELEASE_CAPSULE | Freq: Every day | ORAL | Status: DC
  Administered 2013-09-23: 20 mg via ORAL
  Filled 2013-09-23 (×2): qty 1

## 2013-09-23 MED ORDER — BUPIVACAINE LIPOSOME 1.3 % IJ SUSP
20.0000 mL | Freq: Once | INTRAMUSCULAR | Status: AC
  Administered 2013-09-23: 20 mL
  Filled 2013-09-23: qty 20

## 2013-09-23 MED ORDER — DEXAMETHASONE SODIUM PHOSPHATE 10 MG/ML IJ SOLN
10.0000 mg | Freq: Once | INTRAMUSCULAR | Status: AC
  Administered 2013-09-24: 10 mg via INTRAVENOUS
  Filled 2013-09-23: qty 1

## 2013-09-23 MED ORDER — IRBESARTAN 300 MG PO TABS
300.0000 mg | ORAL_TABLET | Freq: Every day | ORAL | Status: DC
Start: 2013-09-24 — End: 2013-09-24
  Administered 2013-09-24: 300 mg via ORAL
  Filled 2013-09-23: qty 1

## 2013-09-23 MED ORDER — MUPIROCIN 2 % EX OINT
TOPICAL_OINTMENT | Freq: Two times a day (BID) | CUTANEOUS | Status: DC
  Administered 2013-09-23: 1 via NASAL
  Filled 2013-09-23: qty 22

## 2013-09-23 MED ORDER — HYDROMORPHONE HCL PF 1 MG/ML IJ SOLN
INTRAMUSCULAR | Status: AC
  Filled 2013-09-23: qty 1

## 2013-09-23 MED ORDER — HYDROCHLOROTHIAZIDE 25 MG PO TABS
25.0000 mg | ORAL_TABLET | Freq: Every day | ORAL | Status: DC
  Administered 2013-09-24: 25 mg via ORAL
  Filled 2013-09-23: qty 1

## 2013-09-23 MED ORDER — LACTATED RINGERS IV SOLN
INTRAVENOUS | Status: DC
Start: 2013-09-23 — End: 2013-09-23
  Administered 2013-09-23: 1000 mL via INTRAVENOUS
  Administered 2013-09-23: 16:00:00 via INTRAVENOUS

## 2013-09-23 MED ORDER — KETOROLAC TROMETHAMINE 30 MG/ML IJ SOLN
INTRAMUSCULAR | Status: AC
  Filled 2013-09-23: qty 1

## 2013-09-23 MED ORDER — ALUM & MAG HYDROXIDE-SIMETH 200-200-20 MG/5ML PO SUSP: 30.0000 mL | ORAL | Status: DC | PRN

## 2013-09-23 MED ORDER — ASPIRIN EC 325 MG PO TBEC
325.0000 mg | DELAYED_RELEASE_TABLET | Freq: Two times a day (BID) | ORAL | Status: DC
  Administered 2013-09-24: 325 mg via ORAL
  Filled 2013-09-23 (×3): qty 1

## 2013-09-23 MED ORDER — LACTATED RINGERS IV SOLN
INTRAVENOUS | Status: DC
  Administered 2013-09-23: 18:00:00 via INTRAVENOUS

## 2013-09-23 MED ORDER — MENTHOL 3 MG MT LOZG: 1.0000 | LOZENGE | OROMUCOSAL | Status: DC | PRN

## 2013-09-23 MED ORDER — HYDROMORPHONE HCL PF 1 MG/ML IJ SOLN
0.2500 mg | INTRAMUSCULAR | Status: DC | PRN
  Administered 2013-09-23 (×4): 0.5 mg via INTRAVENOUS

## 2013-09-23 MED ORDER — MIDAZOLAM HCL 5 MG/5ML IJ SOLN
INTRAMUSCULAR | Status: DC | PRN
  Administered 2013-09-23 (×2): 1 mg via INTRAVENOUS

## 2013-09-23 MED ORDER — LIDOCAINE HCL (CARDIAC) 20 MG/ML IV SOLN
INTRAVENOUS | Status: AC
  Filled 2013-09-23: qty 5

## 2013-09-23 MED ORDER — ROCURONIUM BROMIDE 100 MG/10ML IV SOLN
INTRAVENOUS | Status: DC | PRN
  Administered 2013-09-23: 50 mg via INTRAVENOUS

## 2013-09-23 MED ORDER — METOCLOPRAMIDE HCL 5 MG/ML IJ SOLN: 5.0000 mg | Freq: Three times a day (TID) | INTRAMUSCULAR | Status: DC | PRN

## 2013-09-23 MED ORDER — ONDANSETRON HCL 4 MG PO TABS
4.0000 mg | ORAL_TABLET | Freq: Four times a day (QID) | ORAL | Status: DC | PRN
Start: 2013-09-23 — End: 2013-09-24

## 2013-09-23 MED ORDER — GLYCOPYRROLATE 0.2 MG/ML IJ SOLN
INTRAMUSCULAR | Status: AC
  Filled 2013-09-23: qty 2

## 2013-09-23 MED ORDER — OLMESARTAN MEDOXOMIL-HCTZ 40-25 MG PO TABS: 1.0000 | ORAL_TABLET | Freq: Every morning | ORAL | Status: DC

## 2013-09-23 MED ORDER — MECLIZINE HCL 25 MG PO TABS
25.0000 mg | ORAL_TABLET | Freq: Three times a day (TID) | ORAL | Status: DC | PRN
  Filled 2013-09-23: qty 1

## 2013-09-23 MED ORDER — BUPIVACAINE-EPINEPHRINE (PF) 0.25% -1:200000 IJ SOLN
INTRAMUSCULAR | Status: AC
  Filled 2013-09-23: qty 30

## 2013-09-23 MED ORDER — FENTANYL CITRATE 0.05 MG/ML IJ SOLN
INTRAMUSCULAR | Status: DC | PRN
  Administered 2013-09-23: 100 ug via INTRAVENOUS
  Administered 2013-09-23 (×3): 50 ug via INTRAVENOUS

## 2013-09-23 MED ORDER — SODIUM CHLORIDE 0.9 % IJ SOLN
INTRAMUSCULAR | Status: DC | PRN
  Administered 2013-09-23: 9 mL

## 2013-09-23 MED ORDER — PHENOL 1.4 % MT LIQD: 1.0000 | OROMUCOSAL | Status: DC | PRN

## 2013-09-23 MED ORDER — AMLODIPINE BESYLATE 5 MG PO TABS
5.0000 mg | ORAL_TABLET | Freq: Every morning | ORAL | Status: DC
  Administered 2013-09-24: 5 mg via ORAL
  Filled 2013-09-23: qty 1

## 2013-09-23 MED ORDER — MAGNESIUM CITRATE PO SOLN: 1.0000 | Freq: Once | ORAL | Status: AC | PRN

## 2013-09-23 MED ORDER — DOCUSATE SODIUM 100 MG PO CAPS
100.0000 mg | ORAL_CAPSULE | Freq: Two times a day (BID) | ORAL | Status: DC
  Administered 2013-09-23 – 2013-09-24 (×2): 100 mg via ORAL

## 2013-09-23 MED ORDER — GLYCOPYRROLATE 0.2 MG/ML IJ SOLN
INTRAMUSCULAR | Status: DC | PRN
  Administered 2013-09-23: 0.4 mg via INTRAVENOUS

## 2013-09-23 MED ORDER — HYDROMORPHONE HCL PF 2 MG/ML IJ SOLN
INTRAMUSCULAR | Status: AC
  Filled 2013-09-23: qty 1

## 2013-09-23 MED ORDER — PROPOFOL 10 MG/ML IV BOLUS
INTRAVENOUS | Status: DC | PRN
  Administered 2013-09-23: 180 mg via INTRAVENOUS

## 2013-09-23 MED ORDER — TRANEXAMIC ACID 100 MG/ML IV SOLN
1000.0000 mg | Freq: Once | INTRAVENOUS | Status: AC
  Administered 2013-09-23: 1000 mg via INTRAVENOUS
  Filled 2013-09-23: qty 10

## 2013-09-23 MED ORDER — DIPHENHYDRAMINE HCL 25 MG PO CAPS: 25.0000 mg | ORAL_CAPSULE | Freq: Four times a day (QID) | ORAL | Status: DC | PRN

## 2013-09-23 MED ORDER — ONDANSETRON HCL 4 MG/2ML IJ SOLN
INTRAMUSCULAR | Status: AC
  Filled 2013-09-23: qty 2

## 2013-09-23 MED ORDER — CLONIDINE HCL 0.2 MG PO TABS
0.2000 mg | ORAL_TABLET | Freq: Every day | ORAL | Status: DC
  Administered 2013-09-23: 0.2 mg via ORAL
  Filled 2013-09-23 (×2): qty 1

## 2013-09-23 MED ORDER — KETOROLAC TROMETHAMINE 30 MG/ML IJ SOLN
INTRAMUSCULAR | Status: DC | PRN
  Administered 2013-09-23: 30 mg

## 2013-09-23 MED ORDER — BISACODYL 10 MG RE SUPP
10.0000 mg | Freq: Every day | RECTAL | Status: DC | PRN
Start: 2013-09-23 — End: 2013-09-24

## 2013-09-23 MED ORDER — HYDROMORPHONE HCL PF 1 MG/ML IJ SOLN: 0.2500 mg | INTRAMUSCULAR | Status: DC | PRN

## 2013-09-23 MED ORDER — HYDROCODONE-ACETAMINOPHEN 7.5-325 MG PO TABS
1.0000 | ORAL_TABLET | ORAL | Status: DC
  Administered 2013-09-23: 2 via ORAL
  Administered 2013-09-23: 1 via ORAL
  Administered 2013-09-24 (×4): 2 via ORAL
  Filled 2013-09-23: qty 1
  Filled 2013-09-23 (×4): qty 2
  Filled 2013-09-23: qty 1
  Filled 2013-09-23: qty 2

## 2013-09-23 MED ORDER — ONDANSETRON HCL 4 MG/2ML IJ SOLN
INTRAMUSCULAR | Status: DC | PRN
  Administered 2013-09-23: 4 mg via INTRAVENOUS

## 2013-09-23 SURGICAL SUPPLY — 65 items
BAG ZIPLOCK 12X15 (MISCELLANEOUS) ×2 IMPLANT
BANDAGE ELASTIC 6 VELCRO ST LF (GAUZE/BANDAGES/DRESSINGS) ×2 IMPLANT
BANDAGE ESMARK 6X9 LF (GAUZE/BANDAGES/DRESSINGS) ×1 IMPLANT
BLADE SAW SGTL 13.0X1.19X90.0M (BLADE) ×2 IMPLANT
BLADE SAW SGTL 81X20 HD (BLADE) ×2 IMPLANT
BNDG ESMARK 6X9 LF (GAUZE/BANDAGES/DRESSINGS) ×2
BOWL SMART MIX CTS (DISPOSABLE) IMPLANT
BRUSH FEMORAL CANAL (MISCELLANEOUS) IMPLANT
CEMENT HV SMART SET (Cement) ×4 IMPLANT
CUFF TOURN SGL QUICK 34 (TOURNIQUET CUFF)
CUFF TOURN SGL QUICK 44 (TOURNIQUET CUFF) ×2 IMPLANT
CUFF TRNQT CYL 34X4X40X1 (TOURNIQUET CUFF) IMPLANT
DERMABOND ADVANCED (GAUZE/BANDAGES/DRESSINGS) ×2
DERMABOND ADVANCED .7 DNX12 (GAUZE/BANDAGES/DRESSINGS) ×2 IMPLANT
DRAPE EXTREMITY T 121X128X90 (DRAPE) ×2 IMPLANT
DRAPE POUCH INSTRU U-SHP 10X18 (DRAPES) ×2 IMPLANT
DRAPE U-SHAPE 47X51 STRL (DRAPES) ×2 IMPLANT
DRSG ADAPTIC 3X8 NADH LF (GAUZE/BANDAGES/DRESSINGS) IMPLANT
DRSG AQUACEL AG ADV 3.5X10 (GAUZE/BANDAGES/DRESSINGS) ×2 IMPLANT
DRSG PAD ABDOMINAL 8X10 ST (GAUZE/BANDAGES/DRESSINGS) IMPLANT
DRSG TEGADERM 4X4.75 (GAUZE/BANDAGES/DRESSINGS) IMPLANT
DURAPREP 26ML APPLICATOR (WOUND CARE) ×4 IMPLANT
ELECT REM PT RETURN 9FT ADLT (ELECTROSURGICAL) ×2
ELECTRODE REM PT RTRN 9FT ADLT (ELECTROSURGICAL) ×1 IMPLANT
FACESHIELD WRAPAROUND (MASK) ×10 IMPLANT
GAUZE SPONGE 2X2 8PLY STRL LF (GAUZE/BANDAGES/DRESSINGS) IMPLANT
GAUZE SPONGE 4X4 12PLY STRL (GAUZE/BANDAGES/DRESSINGS) IMPLANT
GLOVE BIOGEL PI IND STRL 7.5 (GLOVE) ×1 IMPLANT
GLOVE BIOGEL PI IND STRL 8 (GLOVE) ×1 IMPLANT
GLOVE BIOGEL PI INDICATOR 7.5 (GLOVE) ×1
GLOVE BIOGEL PI INDICATOR 8 (GLOVE) ×1
GLOVE ECLIPSE 8.0 STRL XLNG CF (GLOVE) ×2 IMPLANT
GLOVE ORTHO TXT STRL SZ7.5 (GLOVE) ×4 IMPLANT
GOWN SPEC L3 XXLG W/TWL (GOWN DISPOSABLE) ×2 IMPLANT
GOWN STRL REUS W/TWL LRG LVL3 (GOWN DISPOSABLE) ×2 IMPLANT
HANDPIECE INTERPULSE COAX TIP (DISPOSABLE) ×1
IMMOBILIZER KNEE 20 (SOFTGOODS)
IMMOBILIZER KNEE 20 THIGH 36 (SOFTGOODS) IMPLANT
INSERT TIBIA SCORPIO FLEX 7X18 (Orthopedic Implant) ×2 IMPLANT
KIT BASIN OR (CUSTOM PROCEDURE TRAY) ×2 IMPLANT
MANIFOLD NEPTUNE II (INSTRUMENTS) ×2 IMPLANT
NDL SAFETY ECLIPSE 18X1.5 (NEEDLE) ×1 IMPLANT
NEEDLE HYPO 18GX1.5 SHARP (NEEDLE) ×1
NS IRRIG 1000ML POUR BTL (IV SOLUTION) ×2 IMPLANT
PACK TOTAL JOINT (CUSTOM PROCEDURE TRAY) ×2 IMPLANT
PADDING CAST COTTON 6X4 STRL (CAST SUPPLIES) IMPLANT
PATELLA SCORPIO UDOME SZ9 KNEE (Orthopedic Implant) ×2 IMPLANT
POSITIONER SURGICAL ARM (MISCELLANEOUS) ×2 IMPLANT
SET HNDPC FAN SPRY TIP SCT (DISPOSABLE) ×1 IMPLANT
SET PAD KNEE POSITIONER (MISCELLANEOUS) ×2 IMPLANT
SPONGE GAUZE 2X2 STER 10/PKG (GAUZE/BANDAGES/DRESSINGS)
SPONGE LAP 18X18 X RAY DECT (DISPOSABLE) IMPLANT
STAPLER VISISTAT 35W (STAPLE) IMPLANT
SUCTION FRAZIER 12FR DISP (SUCTIONS) ×2 IMPLANT
SUT MNCRL AB 3-0 PS2 18 (SUTURE) ×2 IMPLANT
SUT VIC AB 1 CT1 36 (SUTURE) ×4 IMPLANT
SUT VIC AB 2-0 CT1 27 (SUTURE) ×3
SUT VIC AB 2-0 CT1 TAPERPNT 27 (SUTURE) ×3 IMPLANT
SUT VLOC 180 0 24IN GS25 (SUTURE) ×2 IMPLANT
SYRINGE 60CC LL (MISCELLANEOUS) ×2 IMPLANT
TOWEL OR 17X26 10 PK STRL BLUE (TOWEL DISPOSABLE) ×2 IMPLANT
TOWER CARTRIDGE SMART MIX (DISPOSABLE) IMPLANT
TRAY FOLEY CATH 14FRSI W/METER (CATHETERS) ×2 IMPLANT
WATER STERILE IRR 1500ML POUR (IV SOLUTION) ×2 IMPLANT
WRAP KNEE MAXI GEL POST OP (GAUZE/BANDAGES/DRESSINGS) ×2 IMPLANT

## 2013-09-23 NOTE — Transfer of Care (Signed)
Immediate Anesthesia Transfer of Care Note  Patient: Kathryn CrankerDonna M Olsen  Procedure(s) Performed: Procedure(s) (LRB): REVISION LEFT TOTAL KNEE WITH POLY EXCHANGE UPSIZE AND PATELLA REVISION (Left)  Patient Location: PACU  Anesthesia Type: General  Level of Consciousness: sedated, patient cooperative and responds to stimulation  Airway & Oxygen Therapy: Patient Spontanous Breathing and Patient connected to face mask oxgen  Post-op Assessment: Report given to PACU RN and Post -op Vital signs reviewed and stable  Post vital signs: Reviewed and stable  Complications: No apparent anesthesia complications

## 2013-09-23 NOTE — Brief Op Note (Signed)
09/23/2013  5:05 PM  PATIENT:  Cecil Crankeronna M Russell-Strader  63 y.o. female  PRE-OPERATIVE DIAGNOSIS:  status post left total knee with ligament instability  POST-OPERATIVE DIAGNOSIS:  status post left total knee with ligament instability, failed left total knee replacement  PROCEDURE:  Procedure(s): REVISION LEFT TOTAL KNEE WITH POLY EXCHANGE UPSIZE AND PATELLA REVISION (Left)  SURGEON:  Surgeon(s) and Role:    * Shelda PalMatthew D Katleen Carraway, MD - Primary  PHYSICIAN ASSISTANT: None  ANESTHESIA:   general  EBL:  Total I/O In: 1400 [I.V.:1400] Out: 200 [Urine:150; Blood:50]  BLOOD ADMINISTERED:none  DRAINS: none   LOCAL MEDICATIONS USED:  Exparel/marcaine combination  SPECIMEN:  No Specimen  DISPOSITION OF SPECIMEN:  N/A  COUNTS:  YES  TOURNIQUET:   Total Tourniquet Time Documented: Thigh (Left) - 38 minutes Total: Thigh (Left) - 38 minutes   DICTATION: .Other Dictation: Dictation Number [id number]  PLAN OF CARE: Admit to inpatient   PATIENT DISPOSITION:  PACU - hemodynamically stable.   Delay start of Pharmacological VTE agent (>24hrs) due to surgical blood loss or risk of bleeding: no

## 2013-09-23 NOTE — Anesthesia Preprocedure Evaluation (Addendum)
Anesthesia Evaluation  Patient identified by MRN, date of birth, ID band Patient awake    Reviewed: Allergy & Precautions, H&P , NPO status , Patient's Chart, lab work & pertinent test results  Airway Mallampati: II TM Distance: >3 FB Neck ROM: full    Dental no notable dental hx. (+) Teeth Intact, Dental Advisory Given   Pulmonary shortness of breath, sleep apnea , COPD COPD inhaler,  Stop bang 4 breath sounds clear to auscultation  Pulmonary exam normal       Cardiovascular Exercise Tolerance: Good hypertension, Pt. on medications + DOE negative cardio ROS  Rhythm:regular Rate:Normal  ICRBBB   Neuro/Psych negative neurological ROS  negative psych ROS   GI/Hepatic negative GI ROS, Neg liver ROS, GERD-  Medicated and Controlled,  Endo/Other  negative endocrine ROS  Renal/GU negative Renal ROS  negative genitourinary   Musculoskeletal   Abdominal   Peds  Hematology negative hematology ROS (+)   Anesthesia Other Findings   Reproductive/Obstetrics negative OB ROS                           Anesthesia Physical Anesthesia Plan  ASA: III  Anesthesia Plan: General   Post-op Pain Management:    Induction: Intravenous  Airway Management Planned: Oral ETT  Additional Equipment:   Intra-op Plan:   Post-operative Plan: Extubation in OR  Informed Consent: I have reviewed the patients History and Physical, chart, labs and discussed the procedure including the risks, benefits and alternatives for the proposed anesthesia with the patient or authorized representative who has indicated his/her understanding and acceptance.   Dental Advisory Given  Plan Discussed with: CRNA and Surgeon  Anesthesia Plan Comments:         Anesthesia Quick Evaluation

## 2013-09-23 NOTE — Anesthesia Postprocedure Evaluation (Signed)
  Anesthesia Post-op Note  Patient: Kathryn Olsen  Procedure(s) Performed: Procedure(s) (LRB): REVISION LEFT TOTAL KNEE WITH POLY EXCHANGE UPSIZE AND PATELLA REVISION (Left)  Patient Location: PACU  Anesthesia Type: General  Level of Consciousness: awake and alert   Airway and Oxygen Therapy: Patient Spontanous Breathing  Post-op Pain: mild  Post-op Assessment: Post-op Vital signs reviewed, Patient's Cardiovascular Status Stable, Respiratory Function Stable, Patent Airway and No signs of Nausea or vomiting  Last Vitals:  Filed Vitals:   09/23/13 1730  BP: 133/70  Pulse: 61  Temp:   Resp: 10    Post-op Vital Signs: stable   Complications: No apparent anesthesia complications

## 2013-09-23 NOTE — Interval H&P Note (Signed)
History and Physical Interval Note:  09/23/2013 1:43 PM  Kathryn Olsen  has presented today for surgery, with the diagnosis of status post left total knee with ligament instability  The various methods of treatment have been discussed with the patient and family. After consideration of risks, benefits and other options for treatment, the patient has consented to  Procedure(s): REVISION LEFT TOTAL KNEE WITH POLY EXCHANGE UPSIZE (Left) as a surgical intervention .  The patient's history has been reviewed, patient examined, no change in status, stable for surgery.  I have reviewed the patient's chart and labs.  Questions were answered to the patient's satisfaction.     Shelda PalLIN,Harmoni Lucus D

## 2013-09-24 ENCOUNTER — Encounter (HOSPITAL_COMMUNITY): Payer: Self-pay | Admitting: Orthopedic Surgery

## 2013-09-24 LAB — BASIC METABOLIC PANEL
Anion gap: 13 (ref 5–15)
BUN: 15 mg/dL (ref 6–23)
CO2: 23 mEq/L (ref 19–32)
Calcium: 9 mg/dL (ref 8.4–10.5)
Chloride: 104 mEq/L (ref 96–112)
Creatinine, Ser: 0.73 mg/dL (ref 0.50–1.10)
GFR calc Af Amer: >90 mL/min (ref >90)
GFR calc non Af Amer: 88 mL/min — ABNORMAL LOW (ref >90)
Glucose, Bld: 164 mg/dL — ABNORMAL HIGH (ref 70–99)
Potassium: 4.3 mEq/L (ref 3.7–5.3)
Sodium: 140 mEq/L (ref 137–147)

## 2013-09-24 LAB — CBC
HCT: 39.2 % (ref 36.0–46.0)
Hemoglobin: 12.3 g/dL (ref 12.0–15.0)
MCH: 26.9 pg (ref 26.0–34.0)
MCHC: 31.4 g/dL (ref 30.0–36.0)
MCV: 85.6 fL (ref 78.0–100.0)
Platelets: 257 10*3/uL (ref 150–400)
RBC: 4.58 MIL/uL (ref 3.87–5.11)
RDW: 14.8 % (ref 11.5–15.5)
WBC: 13.2 10*3/uL — ABNORMAL HIGH (ref 4.0–10.5)

## 2013-09-24 MED ORDER — ASPIRIN 325 MG PO TBEC: 325.0000 mg | DELAYED_RELEASE_TABLET | Freq: Two times a day (BID) | ORAL | Status: AC

## 2013-09-24 MED ORDER — POLYETHYLENE GLYCOL 3350 17 G PO PACK: 17.0000 g | PACK | Freq: Two times a day (BID) | ORAL | Status: DC

## 2013-09-24 MED ORDER — FERROUS SULFATE 325 (65 FE) MG PO TABS: 325.0000 mg | ORAL_TABLET | Freq: Three times a day (TID) | ORAL | Status: DC

## 2013-09-24 MED ORDER — METHOCARBAMOL 500 MG PO TABS: 500.0000 mg | ORAL_TABLET | Freq: Four times a day (QID) | ORAL | Status: DC | PRN

## 2013-09-24 MED ORDER — HYDROCODONE-ACETAMINOPHEN 7.5-325 MG PO TABS: 1.0000 | ORAL_TABLET | ORAL | Status: DC | PRN

## 2013-09-24 MED ORDER — DSS 100 MG PO CAPS: 100.0000 mg | ORAL_CAPSULE | Freq: Two times a day (BID) | ORAL | Status: DC

## 2013-09-24 NOTE — Progress Notes (Signed)
CSW consulted for SNF placement. PN reviewed. Pt is planning to d/c home today. RNCM will assist with d/c planning needs.  Cori RazorJamie Iyauna Sing LCSW [phone]

## 2013-09-24 NOTE — Progress Notes (Signed)
RN reviewed discharge instructions with patient. Patient stated understanding. All questions answered.   Paperwork and prescriptions given.   NT rolled patient down in wheelchair to family car.

## 2013-09-24 NOTE — Progress Notes (Signed)
Patient ID: Kathryn Olsen, female   DOB: 04-22-1950, 63 y.o.   MRN: [id number] Subjective: 1 Day Post-Op Procedure(s) (LRB): REVISION LEFT TOTAL KNEE WITH POLY EXCHANGE UPSIZE AND PATELLA REVISION (Left)    Patient reports pain as mild.  Much better than she anticipated based on index procedure.  Otherwise doing fairly well  Objective:   VITALS:   Filed Vitals:   09/24/13 0719  BP: 143/81  Pulse: 67  Temp: 98.2 F (36.8 C)  Resp: 16    Neurovascular intact Incision: dressing C/D/I  LABS  Recent Labs  09/24/13 0505  HGB 12.3  HCT 39.2  WBC 13.2*  PLT 257     Recent Labs  09/24/13 0505  NA 140  K 4.3  BUN 15  CREATININE 0.73  GLUCOSE 164*    No results found for this basename: LABPT, INR,  in the last 72 hours   Assessment/Plan: 1 Day Post-Op Procedure(s) (LRB): REVISION LEFT TOTAL KNEE WITH POLY EXCHANGE UPSIZE AND PATELLA REVISION (Left)   Advance diet Up with therapy Discharge home with home health today after therapy Reviewed goals for follow up stressing motion and strengthening

## 2013-09-24 NOTE — Progress Notes (Signed)
09/24/13 1700  PT Visit Information  Last PT Received On 09/24/13  Assistance Needed +1  History of Present Illness 63 yo female adm for TKA revision-poly exchange,  patella; PMHx:  HTN, DDD, L TKA  PT Time Calculation  PT Start Time 1433  PT Stop Time 1454  PT Time Calculation (min) 21 min  Subjective Data  Patient Stated Goal home  Precautions  Precautions Fall;Knee  Restrictions  Other Position/Activity Restrictions WBAT  Cognition  Arousal/Alertness Awake/alert  Behavior During Therapy WFL for tasks assessed/performed  Overall Cognitive Status Within Functional Limits for tasks assessed  Transfers  Overall transfer level Needs assistance  Equipment used Rolling walker (2 wheeled)  Transfers Sit to/from Stand  Sit to Stand Supervision;Modified independent (Device/Increase time)  General transfer comment cues for hand placement;  Ambulation/Gait  Ambulation/Gait assistance Supervision  Ambulation Distance (Feet) 180 Feet  Assistive device Rolling walker (2 wheeled)  Gait Pattern/deviations Step-to pattern;Antalgic  General Gait Details cues for sequence, RW position from self  Total Joint Exercises  Ankle Circles/Pumps AROM;Both;10 reps  Quad Sets AROM;Strengthening;Both;10 reps  Heel Slides AAROM;Left;10 reps  Hip ABduction/ADduction AROM;Left;10 reps  Straight Leg Raises AROM;AAROM;Left;10 reps;Strengthening  PT - End of Session  Equipment Utilized During Treatment Gait belt  Activity Tolerance Patient tolerated treatment well  Patient left in chair;with call bell/phone within reach  Nurse Communication Mobility status  PT - Assessment/Plan  PT Plan Current plan remains appropriate  PT Frequency 7X/week  Follow Up Recommendations Home health PT  PT equipment None recommended by PT  PT Goal Progression  Progress towards PT goals Progressing toward goals  Acute Rehab PT Goals  PT Goal Formulation With patient  Time For Goal Achievement 09/27/13  Potential to  Achieve Goals Good  PT General Charges  $$ ACUTE PT VISIT 1 Procedure  PT Treatments  $Gait Training 8-22 mins

## 2013-09-24 NOTE — Op Note (Signed)
NAMEADAMAE, RICKLEFS       ACCOUNT NO.:  [id number]  MEDICAL RECORD NO.:  [id number]  LOCATION:  1619                         FACILITY:  [facility]  PHYSICIAN:  Madlyn Frankel. Charlann Boxer, M.D.  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  POSTOPERATIVE DIAGNOSES/FINDINGS: 1. 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 2. Bony lateral facet contact with the femoral trochlea. 3. Findings included internally rotated tibial component  PROCEDURE:  Revision of left total knee arthroplasty with polyethylene exchange from a size 7 x 12 insert to a 7 x 18-mm insert as well as a patellar revision to a size 9 x 10-mm thick polyethylene button.  SURGEON:  Madlyn Frankel. Charlann Boxer, M.D.  ASSISTANT:  Surgical team.  ANESTHESIA:  General.  SPECIMENS:  None.  COMPLICATIONS:  None.  DRAINS:  None.  TOURNIQUET TIME:  38 minutes at 250 mmHg.  COMPLICATION:  None apparent.  INDICATIONS FOR PROCEDURE:  Ms. Callicott is a 63 year old female with history of left total knee arthroplasty.  She presented to the office for evaluation of pain and some swelling.  Workup was negative for infection, bone scan was negative for any loosening.  Given the exam, findings of some ligament laxity from extension to flexion, we revealed that this is a potential source of her discomfort.  She wished at this point to proceed with revision surgery for identification, source of problems and addressing issues are discussed.  Risks of infection, DVT, component failure, but most importantly persistent pain were all discussed and reviewed.  Consent was obtained for benefit of pain relief.  PROCEDURE IN DETAIL:  The patient was brought to the operative theater. Once adequate anesthesia and  preoperative antibiotics, Ancef administered, she was positioned supine with left thigh tourniquet placed.  The left lower extremity was then prepped and draped in sterile fashion.  Time-out was performed identifying the patient, planned procedure, and extremity.  The leg was exsanguinated, and tourniquet elevated to 250 mmHg.  A midline incision was made followed by median parapatellar arthrotomy. Again, we encountered a clear synovial fluid.  Initial exposure and synovectomy was carried out exposing the knee fully.  I then was able to remove the polyethylene insert.  At this point, I also evaluated the patella, found that she had a previously placed inset patellar button with significant bony overgrowth around this as well as lateral facet overgrowth contact with femoral trochlea.  I also identified the internally rotated tibial component upon exposure and evaluation.  At this point, I trialed with the inserts and ended up selected an 18-mm insert and finding that this provided full knee extension was the best stability into flexion.  Once with the trial component placed, I revised the patella, removing the old patellar button and allow me to debride any ectopic bone as well as lateral facet and covered the entire surface of the dome, patella size 9 x 10-mm thick.  Lug holes were drilled and the patella was noted to track through the trochlea with terminal flexion with arthrotomy.  There was some lateral subluxation; however, I felt this was related to the tibial component.  At this point, I did not have enough clinical information to make a decision to revise the entire tibial component as I did not feel this was the source of her pain.  This will be kept in mind, however, from my information as we progress if she fails to make adequate progress over the next year or 2, this would be something to consider.  At this point, the trial components were removed.  The final size 7 x  18- mm posterior stabilized insert was opened and impacted into position with good visualized, secure rim locking.  A single batch of high viscosity cement was mixed and the final 9 x 10- mm thick polyethylene button was cemented into place.  Once the cement cured, excess cement was removed throughout the knee.  The tourniquet was let down after 38 minute.  At this point, the extensor mechanism was reapproximated using #1 Vicryl and 0 V-Loc suture with the knee at about 60 degrees of flexion.  The remainder of wound was closed with 2-0 Vicryl and running 4-0 Monocryl.  The knee was cleaned, dried and dressed sterilely using Dermabond and Aquacel dressing.  The knee was wrapped in an Ace wrap.  She was brought to the recovery room in stable condition tolerating the procedure well.     Madlyn FrankelMatthew D. Charlann Boxerlin, M.D.     MDO/MEDQ  D:  09/23/2013  T:  09/24/2013  Job:  [id number]

## 2013-09-24 NOTE — Evaluation (Signed)
Physical Therapy Evaluation Patient Details Name: Kathryn Olsen MRN: [id number] DOB: January 07, 1951 Today's Date: 09/24/2013   History of Present Illness  63 yo female adm for TKA revision-poly exchange,  patella; PMHx:  HTN, DDD, L TKA  Clinical Impression  Pt  Will benefit from PT to address deficits below; plan is for HHPT    Follow Up Recommendations Home health PT    Equipment Recommendations  None recommended by PT    Recommendations for Other Services       Precautions / Restrictions Precautions Precautions: Fall;Knee Restrictions Other Position/Activity Restrictions: WBAT      Mobility  Bed Mobility Overal bed mobility: Needs Assistance Bed Mobility: Supine to Sit     Supine to sit: Supervision     General bed mobility comments: for safety  Transfers Overall transfer level: Needs assistance Equipment used: Rolling walker (2 wheeled) Transfers: Sit to/from Stand Sit to Stand: Min assist         General transfer comment: cues for hand placement;  Ambulation/Gait Ambulation/Gait assistance: Min assist Ambulation Distance (Feet): 120 Feet Assistive device: Rolling walker (2 wheeled) Gait Pattern/deviations: Step-to pattern;Decreased stance time - left;Decreased step length - right;Decreased step length - left;Trunk flexed     General Gait Details: cues for sequence, RW position from self  Stairs            Wheelchair Mobility    Modified Rankin (Stroke Patients Only)       Balance                                             Pertinent Vitals/Pain VSS; pain 4/10, ice to knee after PT    Home Living Family/patient expects to be discharged to:: Private residence Living Arrangements: Spouse/significant other Available Help at Discharge: Family Type of Home: House Home Access: Stairs to enter Entrance Stairs-Rails: Right;Left;Can reach both Entrance Stairs-Number of Steps: 2 Home Layout: Able to live on main  level with bedroom/bathroom;Two level Home Equipment: Cane - single point;Walker - 4 wheels;Walker - 2 wheels      Prior Function Level of Independence: Independent with assistive device(s);Independent               Hand Dominance        Extremity/Trunk Assessment   Upper Extremity Assessment: Overall WFL for tasks assessed           Lower Extremity Assessment: LLE deficits/detail   LLE Deficits / Details: able to flex knee ~45* AAROM, able to assist with SLR, ankle WFL     Communication   Communication: No difficulties  Cognition Arousal/Alertness: Awake/alert Behavior During Therapy: WFL for tasks assessed/performed Overall Cognitive Status: Within Functional Limits for tasks assessed                      General Comments General comments (skin integrity, edema, etc.): pt donned LB garments with supervision to min/guard for technique and safety, pt began donnin in long sit, finished in standing, discussed with OT    Exercises Total Joint Exercises Ankle Circles/Pumps: AROM;Both;5 reps Quad Sets: 5 reps;AROM;Left Heel Slides: AAROM;Left;10 reps      Assessment/Plan    PT Assessment Patient needs continued PT services  PT Diagnosis Difficulty walking   PT Problem List Decreased range of motion;Decreased strength;Decreased mobility  PT Treatment Interventions DME instruction;Gait training;Functional mobility training;Therapeutic activities;Therapeutic exercise;Patient/family education;Stair  training   PT Goals (Current goals can be found in the Care Plan section) Acute Rehab PT Goals Patient Stated Goal: home PT Goal Formulation: With patient Time For Goal Achievement: 09/27/13 Potential to Achieve Goals: Good    Frequency 7X/week   Barriers to discharge        Co-evaluation               End of Session   Activity Tolerance: Patient tolerated treatment well Patient left: in chair;with call bell/phone within reach            Time: 1017-1035 PT Time Calculation (min): 18 min   Charges:   PT Evaluation $Initial PT Evaluation Tier I: 1 Procedure PT Treatments $Gait Training: 8-22 mins   PT G Codes:          Jania Steinke 09/27/2013, 10:44 AM

## 2013-09-24 NOTE — Progress Notes (Signed)
OT Cancellation Note  Patient Details Name: Kathryn Olsen MRN: [id number] DOB: Jan 26, 1951   Cancelled Treatment:    Reason Eval/Treat Not Completed: OT screened, no needs identified, will sign off. Pt states she has all DME at home and she feels like she is familiar with ADL from previous surgery. She has assist at home also. Will sign off.  [name], [name] [phone] 09/24/2013, 10:43 AM

## 2013-09-26 NOTE — Telephone Encounter (Signed)
Encounter complete. 

## 2013-09-27 NOTE — Discharge Summary (Signed)
Physician Discharge Summary  Patient ID: Kathryn Olsen MRN: [id number] DOB/AGE: 1950/10/16 63 y.o.  Admit date: 09/23/2013 Discharge date: 09/24/2013   Procedures:  Procedure(s) (LRB): REVISION LEFT TOTAL KNEE WITH POLY EXCHANGE UPSIZE AND PATELLA REVISION (Left)  Attending Physician:  Dr. Durene RomansMatthew Olin   Admission Diagnoses:   Left knee pain s/p TKA  Discharge Diagnoses:  Principal Problem:   S/P left knee revision  Past Medical History  Diagnosis Date  . Degeneration of intervertebral disc, site unspecified   . Dyslipidemia   . DJD (degenerative joint disease)   . Asthma   . COPD (chronic obstructive pulmonary disease)     questionable  . Depression   . Sleep apnea     stopbang=4  . Unspecified essential hypertension   . GERD (gastroesophageal reflux disease)   . Pneumonia 2/15  . Shortness of breath     on exertion    HPI: Kathryn Olsen, 63 y.o. 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 The indications for the revision of the total knee arthroplasty are pain since the beginning and stiffness. Onset of symptoms was gradual starting 2 years ago with gradually worsening course since that time. Prior procedures on the left knee(s) include arthroplasty. Patient currently rates pain in the left knee(s) at 9 out of 10 with activity. There is night pain, worsening of pain with activity and weight bearing, pain that interferes with activities of daily living, pain with passive range of motion and joint swelling. Patient has evidence of previous TKA by imaging studies. This condition presents safety issues increasing the risk of falls. There is no current active infection.  Risks, benefits and expectations were discussed with the patient. Risks including but not limited to the risk of anesthesia, blood clots, nerve damage, blood vessel damage, failure of the prosthesis, infection and up to and including death. Patient understand the risks, benefits and expectations and wishes to proceed with surgery.   PCP: Verlon AuBoyd, Tammy Lamonica, MD   Discharged Condition: good  Hospital Course:  Patient underwent the above stated procedure on 09/23/2013. Patient tolerated the procedure well and brought to the recovery room in good condition and subsequently to the floor.  POD #1 BP: 143/81 ; Pulse: 67 ; Temp: 98.2 F (36.8 C) ; Resp: 16  Patient reports pain as mild. Much better than she anticipated based on index procedure. Otherwise doing fairly well. Ready to be discharged home. Dorsiflexion/plantar flexion intact, incision: dressing C/D/I, no cellulitis present and compartment soft.   LABS  Basename    HGB  12.3  HCT  39.2    Discharge Exam: General appearance: alert, cooperative and no distress Extremities: Homans sign is negative, no sign of DVT, no edema, redness or tenderness in the calves or thighs and no ulcers, gangrene or trophic changes  Disposition: Home with follow up in 2 weeks   Follow-up Information   Follow up with Shelda PalLIN,Demond Shallenberger D, MD. Schedule an appointment as soon as possible for a visit in 2 weeks.   Specialty:  Orthopedic Surgery   Contact information:   [address] [place] [address] [phone]       Discharge Instructions   Call MD / Call 911    Complete by:  As  directed   If you experience chest pain or shortness of breath, CALL 911 and be transported to the hospital emergency room.  If you develope a fever above 101 F, pus (white drainage) or increased drainage or redness at the wound, or calf pain, call your surgeon's office.     Change dressing    Complete by:  As directed   Maintain surgical dressing for  10-14 days, or until follow up in the clinic.     Constipation Prevention    Complete by:  As directed   Drink plenty of fluids.  Prune juice may be helpful.  You may use a stool softener, such as Colace (over the counter) 100 mg twice a day.  Use MiraLax (over the counter) for constipation as needed.     Diet - low sodium heart healthy    Complete by:  As directed      Discharge instructions    Complete by:  As directed   Maintain surgical dressing for 10-14 days, or until follow up in the clinic. Follow up in 2 weeks at [facility]. Call with any questions or concerns.     Increase activity slowly as tolerated    Complete by:  As directed      TED hose    Complete by:  As directed   Use stockings (TED hose) for 2 weeks on both leg(s).  You may remove them at night for sleeping.     Weight bearing as tolerated    Complete by:  As directed   Laterality:  left  Extremity:  Lower             Medication List    STOP taking these medications       meloxicam 15 MG tablet  Commonly known as:  MOBIC     traMADol 50 MG tablet  Commonly known as:  ULTRAM      TAKE these medications       albuterol 108 (90 BASE) MCG/ACT inhaler  Commonly known as:  PROVENTIL HFA;VENTOLIN HFA  Inhale 2 puffs into the lungs every 6 (six) hours as needed for wheezing or shortness of breath.     amLODipine 5 MG tablet  Commonly known as:  NORVASC  Take 5 mg by mouth every morning.     aspirin 325 MG EC tablet  Take 1 tablet (325 mg total) by mouth 2 (two) times daily.     calcium-vitamin D 250-100 MG-UNIT per tablet  Take 1 tablet by mouth daily.     cloNIDine 0.2 MG tablet  Commonly known as:  CATAPRES  Take 0.2 mg by mouth at bedtime.     DSS 100 MG Caps  Take 100 mg by mouth 2 (two) times daily.     ferrous sulfate 325 (65 FE) MG tablet  Take 1 tablet (325 mg total) by mouth 3 (three) times daily after meals.     HYDROcodone-acetaminophen 7.5-325 MG per tablet  Commonly  known as:  NORCO  Take 1-2 tablets by mouth every 4 (four) hours as needed for moderate pain.     meclizine 25 MG tablet  Commonly known as:  ANTIVERT  Take 1 tablet (25 mg total) by mouth 3 (three) times daily as needed for dizziness.     methocarbamol 500 MG tablet  Commonly known as:  ROBAXIN  Take 1 tablet (500 mg total) by mouth every 6 (six) hours as needed for muscle spasms.     olmesartan-hydrochlorothiazide 40-25 MG per  tablet  Commonly known as:  BENICAR HCT  Take 1 tablet by mouth every morning.     polyethylene glycol packet  Commonly known as:  MIRALAX / GLYCOLAX  Take 17 g by mouth 2 (two) times daily.     ZEGERID OTC PO  Take 1 capsule by mouth every evening.         Signed: Anastasio Auerbach. Marsh Heckler   PA-C  09/27/2013, 8:05 AM

## 2013-09-30 ENCOUNTER — Institutional Professional Consult (permissible substitution): Payer: Self-pay | Admitting: Pulmonary Disease

## 2013-10-15 ENCOUNTER — Ambulatory Visit: Payer: Worker's Compensation | Attending: Orthopedic Surgery | Admitting: Physical Therapy

## 2013-10-15 DIAGNOSIS — M25669 Stiffness of unspecified knee, not elsewhere classified: Secondary | ICD-10-CM | POA: Diagnosis not present

## 2013-10-15 DIAGNOSIS — M25569 Pain in unspecified knee: Secondary | ICD-10-CM | POA: Insufficient documentation

## 2013-10-18 ENCOUNTER — Ambulatory Visit: Payer: Worker's Compensation | Attending: Orthopedic Surgery | Admitting: Physical Therapy

## 2013-10-18 DIAGNOSIS — M25569 Pain in unspecified knee: Secondary | ICD-10-CM | POA: Diagnosis present

## 2013-10-18 DIAGNOSIS — M25669 Stiffness of unspecified knee, not elsewhere classified: Secondary | ICD-10-CM | POA: Insufficient documentation

## 2013-10-21 ENCOUNTER — Encounter: Payer: Self-pay | Admitting: Physical Therapy

## 2013-10-22 ENCOUNTER — Ambulatory Visit: Payer: Worker's Compensation | Attending: Orthopedic Surgery | Admitting: Physical Therapy

## 2013-10-22 DIAGNOSIS — M25669 Stiffness of unspecified knee, not elsewhere classified: Secondary | ICD-10-CM | POA: Diagnosis not present

## 2013-10-22 DIAGNOSIS — M25569 Pain in unspecified knee: Secondary | ICD-10-CM | POA: Diagnosis present

## 2013-10-25 ENCOUNTER — Ambulatory Visit: Payer: Worker's Compensation | Attending: Orthopedic Surgery | Admitting: Physical Therapy

## 2013-10-25 DIAGNOSIS — M25569 Pain in unspecified knee: Secondary | ICD-10-CM | POA: Diagnosis not present

## 2013-10-25 DIAGNOSIS — M25669 Stiffness of unspecified knee, not elsewhere classified: Secondary | ICD-10-CM | POA: Diagnosis not present

## 2013-10-28 ENCOUNTER — Ambulatory Visit (INDEPENDENT_AMBULATORY_CARE_PROVIDER_SITE_OTHER): Payer: Medicare PPO | Admitting: Pulmonary Disease

## 2013-10-28 ENCOUNTER — Encounter: Payer: Self-pay | Admitting: Pulmonary Disease

## 2013-10-28 VITALS — BP 132/70 | HR 107 | Ht 66.0 in | Wt 239.0 lb

## 2013-10-28 DIAGNOSIS — K219 Gastro-esophageal reflux disease without esophagitis: Secondary | ICD-10-CM

## 2013-10-28 DIAGNOSIS — E669 Obesity, unspecified: Secondary | ICD-10-CM

## 2013-10-28 DIAGNOSIS — R0602 Shortness of breath: Secondary | ICD-10-CM

## 2013-10-28 NOTE — Assessment & Plan Note (Signed)
I explained to her that there are many different causes of shortness of breath including cardiac disease, pulmonary disease, neuromuscular disease and hematologic illnesses. Fortunately, her pulmonary exam is normal as is her ambulatory oximetry and a chest x-ray from April was essentially normal. I am also pleased with the fact that her recent cardiac stress test was normal. A recent hemoglobin was also normal.  So at this point we will obtain pulmonary function testing to make sure that there is no respiratory abnormality such as COPD or asthma (I doubt both). I suspect we will see restrictive lung disease secondary to her obesity.  I will also order an echocardiogram as her pulmonary vasculature appear prominent on the recent chest x-ray.  Plan: Echo Pulmonary function test Followup in 3 days after the above testing.  If these tests are found to be normal then we will call this shortness of breath due to deconditioning and obesity and will encourage diet and weight loss

## 2013-10-28 NOTE — Patient Instructions (Signed)
We will arrange a pulmonary function test and an echocardiogram this week and will see you back on Thursday at 4:30

## 2013-10-28 NOTE — Assessment & Plan Note (Signed)
Her Body mass index is 38.59 kg/(m^2). and she has gained 45 pounds in 2 years.  I think that this is the primary problem.

## 2013-10-28 NOTE — Progress Notes (Signed)
Subjective:    Patient ID: Kathryn Olsen, female    DOB: Aug 02, 1950, 63 y.o.   MRN: [id number]  HPI  [name] is here to see me because sh has been experiencing shortness of breath lately.  Sheha da stress test recently which was overall low risk for cardiac disease.  She was told several years ago that she had early stage COPD.  She said that every winter she gets pneumonia.  As a child she had asthma and took allergy pills as a child.  Sh has always been allergic to mold and dust because they will cause throat itching and swelling.  She stays clear of mold and dust an this helps control her symptoms.  She will get a runny nose and drainage with pollen in the spring.  She has noted dyspnea for years.  She says that she is dyspnic with any exertion.  She will get very short of breath with climbing a flight of stairs, particularly if she is carrying something.  She often feels heaviness in her chest, but no dyspnea a rest.  She gets dyspneic with walking short distances on level ground.  Walking through the grocery store is difficult.  It has been worse in the last year. She can't do chores like before, can only clean one room in the house at a time.  She really feels like her activity.  She does not believe that she has been bleeding. She has gained 45 pounds in the last 2 years.  She has a ot of sinus drainage but minimal cough or sputum production.  She occasionally will have a severe coughing spell that will make her black out.  She occassionally will choke on water.  She had a knee replacement and revision in the last two years and has been much less active during this time.   She has not exercised regularly in 3-4 years and has gained 45 pounds in the last two years.   She has noted leg swelling in the last year, particulalry worse by the end of the day after being on her feet.  Past Medical History  Diagnosis Date  . Degeneration of intervertebral disc, site  unspecified   . Dyslipidemia   . DJD (degenerative joint disease)   . Asthma   . COPD (chronic obstructive pulmonary disease)     questionable  . Depression   . Sleep apnea     stopbang=4  . Unspecified essential hypertension   . GERD (gastroesophageal reflux disease)   . Pneumonia 2/15  . Shortness of breath     on exertion     Family History  Problem Relation Age of Onset  . Lung cancer Father   . COPD Mother   . Hypertension Mother   . Alcohol abuse Brother   . Melanoma Brother      History   Social History  . Marital Status: Married    Spouse Name: N/A    Number of Children: 2  . Years of Education: N/A   Occupational History  . Nurse    Social History Main Topics  . Smoking status: Passive Smoke Exposure - Never Smoker  . Smokeless tobacco: Never Used     Comment: both parents smoked  . Alcohol Use: 0.6 oz/week    1 Glasses of wine per week     Comment: occasional glass of wine  . Drug Use: No  . Sexual Activity: Not on file   Other Topics Concern  .  Not on file   Social History Narrative  . No narrative on file     Allergies  Allergen Reactions  . Morphine And Related Anaphylaxis and Itching     Outpatient Prescriptions Prior to Visit  Medication Sig Dispense Refill  . amLODipine (NORVASC) 5 MG tablet Take 5 mg by mouth every morning.      . calcium-vitamin D 250-100 MG-UNIT per tablet Take 1 tablet by mouth daily.      . cloNIDine (CATAPRES) 0.2 MG tablet Take 0.2 mg by mouth at bedtime.       . ferrous sulfate 325 (65 FE) MG tablet Take 1 tablet (325 mg total) by mouth 3 (three) times daily after meals.    3  . HYDROcodone-acetaminophen (NORCO) 7.5-325 MG per tablet Take 1-2 tablets by mouth every 4 (four) hours as needed for moderate pain.  100 tablet  0  . meclizine (ANTIVERT) 25 MG tablet Take 1 tablet (25 mg total) by mouth 3 (three) times daily as needed for dizziness.  12 tablet  0  . olmesartan-hydrochlorothiazide (BENICAR HCT) 40-25 MG  per tablet Take 1 tablet by mouth every morning.      Kathryn Olsen. Omeprazole-Sodium Bicarbonate (ZEGERID OTC PO) Take 1 capsule by mouth every evening.      . docusate sodium 100 MG CAPS Take 100 mg by mouth 2 (two) times daily.  10 capsule  0  . albuterol (PROVENTIL HFA;VENTOLIN HFA) 108 (90 BASE) MCG/ACT inhaler Inhale 2 puffs into the lungs every 6 (six) hours as needed for wheezing or shortness of breath.       . methocarbamol (ROBAXIN) 500 MG tablet Take 1 tablet (500 mg total) by mouth every 6 (six) hours as needed for muscle spasms.  50 tablet  0  . polyethylene glycol (MIRALAX / GLYCOLAX) packet Take 17 g by mouth 2 (two) times daily.  14 each  0   No facility-administered medications prior to visit.      Review of Systems  Constitutional: Negative for fever and unexpected weight change.  HENT: Negative for congestion, dental problem, ear pain, nosebleeds, postnasal drip, rhinorrhea, sinus pressure, sneezing, sore throat and trouble swallowing.   Eyes: Negative for redness and itching.  Respiratory: Positive for shortness of breath and wheezing. Negative for cough and chest tightness.   Cardiovascular: Negative for palpitations and leg swelling.  Gastrointestinal: Negative for nausea and vomiting.  Genitourinary: Negative for dysuria.  Musculoskeletal: Negative for joint swelling.  Skin: Negative for rash.  Neurological: Negative for headaches.  Hematological: Does not bruise/bleed easily.  Psychiatric/Behavioral: Negative for dysphoric mood. The patient is not nervous/anxious.        Objective:   Physical Exam Filed Vitals:   10/28/13 1440  BP: 132/70  Pulse: 107  Height: 5\' 6"  (1.676 m)  Weight: 239 lb (108.41 kg)  SpO2: 97%   RA  Ambulated 500 feet on RA and her O2 saturation remained at 100%  Gen: morbidly obese, no acute distress HEENT: NCAT, PERRL, EOMi, OP clear, neck supple without masses PULM: CTA B CV: RRR, no mgr, no JVD AB: BS+, soft, nontender, no hsm Ext:  warm, no edema, no clubbing, no cyanosis Derm: no rash or skin breakdown Neuro: A&Ox4, CN II-XII intact, strength 5/5 in all 4 extremities        Assessment & Plan:   Shortness of breath I explained to her that there are many different causes of shortness of breath including cardiac disease, pulmonary disease, neuromuscular disease and hematologic  illnesses. Fortunately, her pulmonary exam is normal as is her ambulatory oximetry and a chest x-ray from April was essentially normal. I am also pleased with the fact that her recent cardiac stress test was normal. A recent hemoglobin was also normal.  So at this point we will obtain pulmonary function testing to make sure that there is no respiratory abnormality such as COPD or asthma (I doubt both). I suspect we will see restrictive lung disease secondary to her obesity.  I will also order an echocardiogram as her pulmonary vasculature appear prominent on the recent chest x-ray.  Plan: Echo Pulmonary function test Followup in 3 days after the above testing.  If these tests are found to be normal then we will call this shortness of breath due to deconditioning and obesity and will encourage diet and weight loss   Esophageal reflux It sounds like this contributes to cough and is related to her hiatal hernia.  Plan: -Continue PPI -GERD lifestyle modification reviewed  Obesity, unspecified Her Body mass index is 38.59 kg/(m^2). and she has gained 45 pounds in 2 years.  I think that this is the primary problem.    Updated Medication List Outpatient Encounter Prescriptions as of 10/28/2013  Medication Sig  . amLODipine (NORVASC) 5 MG tablet Take 5 mg by mouth every morning.  . calcium-vitamin D 250-100 MG-UNIT per tablet Take 1 tablet by mouth daily.  . cloNIDine (CATAPRES) 0.2 MG tablet Take 0.2 mg by mouth at bedtime.   Kathryn Olsen Sodium (DSS) 100 MG CAPS Take 100 mg by mouth daily.  . ferrous sulfate 325 (65 FE) MG tablet Take 1  tablet (325 mg total) by mouth 3 (three) times daily after meals.  [name] Kitchen HYDROcodone-acetaminophen (NORCO) 7.5-325 MG per tablet Take 1-2 tablets by mouth every 4 (four) hours as needed for moderate pain.  . meclizine (ANTIVERT) 25 MG tablet Take 1 tablet (25 mg total) by mouth 3 (three) times daily as needed for dizziness.  . meloxicam (MOBIC) 15 MG tablet Take 15 mg by mouth 2 (two) times daily.  [name] Kitchen olmesartan-hydrochlorothiazide (BENICAR HCT) 40-25 MG per tablet Take 1 tablet by mouth every morning.  Kathryn Olsen Bicarbonate (ZEGERID OTC PO) Take 1 capsule by mouth every evening.  . [DISCONTINUED] docusate sodium 100 MG CAPS Take 100 mg by mouth 2 (two) times daily.  [name] Kitchen albuterol (PROVENTIL HFA;VENTOLIN HFA) 108 (90 BASE) MCG/ACT inhaler Inhale 2 puffs into the lungs every 6 (six) hours as needed for wheezing or shortness of breath.   . [DISCONTINUED] methocarbamol (ROBAXIN) 500 MG tablet Take 1 tablet (500 mg total) by mouth every 6 (six) hours as needed for muscle spasms.  . [DISCONTINUED] polyethylene glycol (MIRALAX / GLYCOLAX) packet Take 17 g by mouth 2 (two) times daily.

## 2013-10-28 NOTE — Assessment & Plan Note (Signed)
It sounds like this contributes to cough and is related to her hiatal hernia.  Plan: -Continue PPI -GERD lifestyle modification reviewed

## 2013-10-30 ENCOUNTER — Other Ambulatory Visit (HOSPITAL_COMMUNITY): Payer: Medicare PPO | Admitting: Pulmonary Disease

## 2013-10-30 ENCOUNTER — Ambulatory Visit: Payer: Worker's Compensation | Attending: Orthopedic Surgery | Admitting: Physical Therapy

## 2013-10-30 DIAGNOSIS — M25669 Stiffness of unspecified knee, not elsewhere classified: Secondary | ICD-10-CM | POA: Insufficient documentation

## 2013-10-30 DIAGNOSIS — Z96659 Presence of unspecified artificial knee joint: Secondary | ICD-10-CM | POA: Insufficient documentation

## 2013-10-30 DIAGNOSIS — J449 Chronic obstructive pulmonary disease, unspecified: Secondary | ICD-10-CM | POA: Diagnosis not present

## 2013-10-30 DIAGNOSIS — I10 Essential (primary) hypertension: Secondary | ICD-10-CM | POA: Diagnosis not present

## 2013-10-30 DIAGNOSIS — IMO0001 Reserved for inherently not codable concepts without codable children: Secondary | ICD-10-CM | POA: Insufficient documentation

## 2013-10-30 DIAGNOSIS — M25569 Pain in unspecified knee: Secondary | ICD-10-CM | POA: Diagnosis not present

## 2013-10-30 DIAGNOSIS — I272 Pulmonary hypertension, unspecified: Secondary | ICD-10-CM

## 2013-10-30 DIAGNOSIS — J4489 Other specified chronic obstructive pulmonary disease: Secondary | ICD-10-CM | POA: Insufficient documentation

## 2013-10-31 ENCOUNTER — Ambulatory Visit: Payer: Worker's Compensation | Attending: Orthopedic Surgery | Admitting: Physical Therapy

## 2013-10-31 DIAGNOSIS — M25669 Stiffness of unspecified knee, not elsewhere classified: Secondary | ICD-10-CM | POA: Insufficient documentation

## 2013-10-31 DIAGNOSIS — M25569 Pain in unspecified knee: Secondary | ICD-10-CM | POA: Insufficient documentation

## 2013-11-05 ENCOUNTER — Ambulatory Visit: Payer: Worker's Compensation | Attending: Orthopedic Surgery | Admitting: Physical Therapy

## 2013-11-05 DIAGNOSIS — M25569 Pain in unspecified knee: Secondary | ICD-10-CM | POA: Insufficient documentation

## 2013-11-05 DIAGNOSIS — M25669 Stiffness of unspecified knee, not elsewhere classified: Secondary | ICD-10-CM | POA: Insufficient documentation

## 2013-11-06 ENCOUNTER — Ambulatory Visit (HOSPITAL_COMMUNITY): Payer: Medicare PPO | Attending: Pulmonary Disease | Admitting: Radiology

## 2013-11-06 DIAGNOSIS — J4489 Other specified chronic obstructive pulmonary disease: Secondary | ICD-10-CM | POA: Insufficient documentation

## 2013-11-06 DIAGNOSIS — R0609 Other forms of dyspnea: Secondary | ICD-10-CM | POA: Insufficient documentation

## 2013-11-06 DIAGNOSIS — I272 Pulmonary hypertension, unspecified: Secondary | ICD-10-CM

## 2013-11-06 DIAGNOSIS — R0602 Shortness of breath: Secondary | ICD-10-CM | POA: Diagnosis present

## 2013-11-06 DIAGNOSIS — R609 Edema, unspecified: Secondary | ICD-10-CM | POA: Insufficient documentation

## 2013-11-06 DIAGNOSIS — J449 Chronic obstructive pulmonary disease, unspecified: Secondary | ICD-10-CM | POA: Insufficient documentation

## 2013-11-06 DIAGNOSIS — Z8249 Family history of ischemic heart disease and other diseases of the circulatory system: Secondary | ICD-10-CM | POA: Insufficient documentation

## 2013-11-06 DIAGNOSIS — R0989 Other specified symptoms and signs involving the circulatory and respiratory systems: Secondary | ICD-10-CM | POA: Insufficient documentation

## 2013-11-06 DIAGNOSIS — R0600 Dyspnea, unspecified: Secondary | ICD-10-CM

## 2013-11-06 NOTE — Progress Notes (Signed)
Echocardiogram performed.  

## 2013-11-07 ENCOUNTER — Encounter: Payer: Medicare PPO | Admitting: Pulmonary Disease

## 2013-11-07 ENCOUNTER — Ambulatory Visit: Payer: Worker's Compensation | Admitting: Physical Therapy

## 2013-11-07 DIAGNOSIS — M25569 Pain in unspecified knee: Secondary | ICD-10-CM | POA: Diagnosis not present

## 2013-11-07 NOTE — Progress Notes (Signed)
Quick Note:  lmtcb X1 to relay results. ______ 

## 2013-11-08 ENCOUNTER — Telehealth: Payer: Self-pay | Admitting: Pulmonary Disease

## 2013-11-08 NOTE — Telephone Encounter (Signed)
Pt calling again a/b ressults.Kathryn Olsen

## 2013-11-11 NOTE — Telephone Encounter (Signed)
Spoke with pt, she is aware of results.  Verified appt.  Nothing further needed at this time.

## 2013-11-11 NOTE — Telephone Encounter (Signed)
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  This is not an emergency, but she and I need to address this on our next clinic visit.      Thanks      B ------

## 2013-11-15 ENCOUNTER — Ambulatory Visit: Payer: Worker's Compensation | Attending: Orthopedic Surgery | Admitting: Physical Therapy

## 2013-11-15 DIAGNOSIS — J4489 Other specified chronic obstructive pulmonary disease: Secondary | ICD-10-CM | POA: Insufficient documentation

## 2013-11-15 DIAGNOSIS — M25669 Stiffness of unspecified knee, not elsewhere classified: Secondary | ICD-10-CM | POA: Diagnosis not present

## 2013-11-15 DIAGNOSIS — M25569 Pain in unspecified knee: Secondary | ICD-10-CM | POA: Diagnosis not present

## 2013-11-15 DIAGNOSIS — J449 Chronic obstructive pulmonary disease, unspecified: Secondary | ICD-10-CM | POA: Insufficient documentation

## 2013-11-15 DIAGNOSIS — IMO0001 Reserved for inherently not codable concepts without codable children: Secondary | ICD-10-CM | POA: Diagnosis present

## 2013-11-15 DIAGNOSIS — I10 Essential (primary) hypertension: Secondary | ICD-10-CM | POA: Diagnosis not present

## 2013-11-15 DIAGNOSIS — Z96659 Presence of unspecified artificial knee joint: Secondary | ICD-10-CM | POA: Diagnosis not present

## 2013-11-19 ENCOUNTER — Ambulatory Visit: Payer: Worker's Compensation | Admitting: Physical Therapy

## 2013-11-19 DIAGNOSIS — IMO0001 Reserved for inherently not codable concepts without codable children: Secondary | ICD-10-CM | POA: Diagnosis not present

## 2013-11-22 ENCOUNTER — Ambulatory Visit: Payer: Worker's Compensation | Admitting: Physical Therapy

## 2013-11-22 DIAGNOSIS — IMO0001 Reserved for inherently not codable concepts without codable children: Secondary | ICD-10-CM | POA: Diagnosis not present

## 2013-11-26 ENCOUNTER — Ambulatory Visit: Payer: Worker's Compensation | Admitting: Physical Therapy

## 2013-11-26 DIAGNOSIS — IMO0001 Reserved for inherently not codable concepts without codable children: Secondary | ICD-10-CM | POA: Diagnosis not present

## 2013-11-27 ENCOUNTER — Ambulatory Visit: Payer: Worker's Compensation | Admitting: Physical Therapy

## 2013-11-29 ENCOUNTER — Encounter: Payer: Self-pay | Admitting: Pulmonary Disease

## 2013-11-29 ENCOUNTER — Ambulatory Visit (INDEPENDENT_AMBULATORY_CARE_PROVIDER_SITE_OTHER): Payer: Medicare PPO | Admitting: Pulmonary Disease

## 2013-11-29 ENCOUNTER — Ambulatory Visit: Payer: Worker's Compensation | Admitting: Physical Therapy

## 2013-11-29 VITALS — BP 134/82 | HR 80 | Temp 98.1°F | Ht 65.25 in | Wt 239.0 lb

## 2013-11-29 DIAGNOSIS — R5381 Other malaise: Secondary | ICD-10-CM | POA: Insufficient documentation

## 2013-11-29 DIAGNOSIS — Z23 Encounter for immunization: Secondary | ICD-10-CM

## 2013-11-29 DIAGNOSIS — R0602 Shortness of breath: Secondary | ICD-10-CM

## 2013-11-29 DIAGNOSIS — R5383 Other fatigue: Secondary | ICD-10-CM

## 2013-11-29 DIAGNOSIS — J4530 Mild persistent asthma, uncomplicated: Secondary | ICD-10-CM

## 2013-11-29 DIAGNOSIS — J45909 Unspecified asthma, uncomplicated: Secondary | ICD-10-CM

## 2013-11-29 LAB — PULMONARY FUNCTION TEST
DL/VA % pred: 113 %
DL/VA: 5.63 ml/min/mmHg/L
DLCO unc % pred: 99 %
DLCO unc: 25.89 ml/min/mmHg
FEF 25-75 Post: 2.54 L/sec
FEF 25-75 Pre: 0.72 L/sec
FEF2575-%Change-Post: 251 %
FEF2575-%Pred-Post: 116 %
FEF2575-%Pred-Pre: 33 %
FEV1-%Change-Post: 46 %
FEV1-%Pred-Post: 80 %
FEV1-%Pred-Pre: 55 %
FEV1-Post: 2.03 L
FEV1-Pre: 1.39 L
FEV1FVC-%Change-Post: 16 %
FEV1FVC-%Pred-Pre: 81 %
FEV6-%Change-Post: 27 %
FEV6-%Pred-Post: 87 %
FEV6-%Pred-Pre: 68 %
FEV6-Post: 2.77 L
FEV6-Pre: 2.17 L
FEV6FVC-%Change-Post: 1 %
FEV6FVC-%Pred-Post: 104 %
FEV6FVC-%Pred-Pre: 102 %
FVC-%Change-Post: 25 %
FVC-%Pred-Post: 83 %
FVC-%Pred-Pre: 66 %
FVC-Post: 2.77 L
FVC-Pre: 2.2 L
Post FEV1/FVC ratio: 73 %
Post FEV6/FVC ratio: 100 %
Pre FEV1/FVC ratio: 63 %
Pre FEV6/FVC Ratio: 98 %

## 2013-11-29 MED ORDER — AEROCHAMBER Z-STAT PLUS MISC: Status: DC

## 2013-11-29 MED ORDER — ALBUTEROL SULFATE HFA 108 (90 BASE) MCG/ACT IN AERS: 1.0000 | INHALATION_SPRAY | Freq: Four times a day (QID) | RESPIRATORY_TRACT | Status: DC | PRN

## 2013-11-29 MED ORDER — MOMETASONE FURO-FORMOTEROL FUM 100-5 MCG/ACT IN AERO: 2.0000 | INHALATION_SPRAY | Freq: Two times a day (BID) | RESPIRATORY_TRACT | Status: DC

## 2013-11-29 NOTE — Progress Notes (Signed)
PFT done today. 

## 2013-11-29 NOTE — Assessment & Plan Note (Signed)
I was surprised today to see a significant amount of airflow reversibility on her pulmonary function testing. At baseline she has moderate airflow obstruction. Overall this is consistent with asthma considering the fact that she nearly completely corrected her FEV1 and she has never smoked cigarettes. She would be characterized as at least mild intermittent asthma based on her symptoms.  We talked about this at length today.   Plan: She will receive a flu shot today. Sample of Symbicort given with spacer and appropriate instructions on use Albuterol prescription written with instructions to use 2 puffs every 4 hours as needed for shortness of breath

## 2013-11-29 NOTE — Progress Notes (Signed)
Subjective:    Patient ID: Kathryn Olsen, female    DOB: [birth year], 63 y.o.   MRN: [id number]  Synopsis: Refer to [facility] in 2015 for evaluation of shortness of breath. She is a lifetime nonsmoker and is morbidly obese. She had an echocardiogram which showed a normal LV size and function and an RVSP of 35 mm of mercury 11/29/2013 full pulmonary function testing> ratio 63% with FEV1 of 1.39 L (55% predicted) pre-bronchodilator, postbronchodilator ratio 73% FEV1 2.03 L (80% predicted, 46% change) DLCO of 99% predicted  HPI  11/29/2013 ROV > Kathryn Olsen says she has been doing about the same.  She continues to have some dyspnea when she is exerting her self, not too bad at rest.  She isn't just sitting around, but sh has limited her activity some.  No cough.  She will occassional l get cough with sinus drainage and congestion.  This has been wose lately due to the ragweed, mold mildew. Husband never noted snoring, but she was a "heavy breather", however she has noted that she wakes herself up snoring often.  She takes hydrococodone at night.  She goes to bed at 10-11PM, she wakes up at 4-5Am.  She doesn't feel well rested in the morning.  She stays tired all the time.  She takes a nap about every day sometimes twice a day.    Past Medical History  Diagnosis Date  . Degeneration of intervertebral disc, site unspecified   . Dyslipidemia   . DJD (degenerative joint disease)   . Asthma   . COPD (chronic obstructive pulmonary disease)     questionable  . Depression   . Sleep apnea     stopbang=4  . Unspecified essential hypertension   . GERD (gastroesophageal reflux disease)   . Pneumonia 2/15  . Shortness of breath     on exertion     Review of Systems  Constitutional: Positive for fatigue. Negative for fever and chills.  HENT: Negative for postnasal drip, rhinorrhea and sinus pressure.   Respiratory: Positive for shortness of breath. Negative for cough and wheezing.     Cardiovascular: Negative for chest pain, palpitations and leg swelling.       Objective:   Physical Exam Filed Vitals:   11/29/13 1119  BP: 134/82  Pulse: 80  Temp: 98.1 F (36.7 C)  TempSrc: Oral  Height: 5' 5.25" (1.657 m)  Weight: 239 lb (108.41 kg)  SpO2: 98%    Gen: well appearing, no acute distress HEENT: NCAT, EOMi, OP clear,  PULM: CTA B CV: RRR, no mgr, no JVD AB: BS+, soft, nontender, Ext: warm, no edema, no clubbing, no cyanosis Derm: no rash or skin breakdown Neuro: A&Ox4, MAEW      Assessment & Plan:   Asthma, chronic I was surprised today to see a significant amount of airflow reversibility on her pulmonary function testing. At baseline she has moderate airflow obstruction. Overall this is consistent with asthma considering the fact that she nearly completely corrected her FEV1 and she has never smoked cigarettes. She would be characterized as at least mild intermittent asthma based on her symptoms.  We talked about this at length today.   Plan: She will receive a flu shot today. Sample of Symbicort given with spacer and appropriate instructions on use Albuterol prescription written with instructions to use 2 puffs every 4 hours as needed for shortness of breath  Shortness of breath Her echocardiogram showed a mild elevation in the right ventricular systolic pressure.  Notably, this is an estimate so we cannot take it as reliable. However, if she does have some pulmonary hypertension it is likely due to obstructive sleep apnea related to her obesity.  Other malaise and fatigue This is clearly multifactorial do to her obesity and multiple comorbid medical illnesses. However, I have a strong suspicion for obstructive sleep apnea considering her obesity, her daytime somnolence, and her lack of concentration in the afternoons. She also notes heavy snoring as well as frequent awakenings at night.  Plan: -Advised to lose weight -No driving while  sleepy -Split-night study    Updated Medication List Outpatient Encounter Prescriptions as of 11/29/2013  Medication Sig  . amLODipine (NORVASC) 5 MG tablet Take 5 mg by mouth every morning.  . calcium-vitamin D 250-100 MG-UNIT per tablet Take 1 tablet by mouth daily.  . cloNIDine (CATAPRES) 0.2 MG tablet Take 0.2 mg by mouth at bedtime.   . ferrous sulfate 325 (65 FE) MG tablet Take 1 tablet (325 mg total) by mouth 3 (three) times daily after meals.  [name] Kitchen HYDROcodone-acetaminophen (NORCO) 7.5-325 MG per tablet Take 1-2 tablets by mouth every 4 (four) hours as needed for moderate pain.  . meclizine (ANTIVERT) 25 MG tablet Take 1 tablet (25 mg total) by mouth 3 (three) times daily as needed for dizziness.  . meloxicam (MOBIC) 15 MG tablet Take 15 mg by mouth daily.   [name] Kitchen olmesartan-hydrochlorothiazide (BENICAR HCT) 40-25 MG per tablet Take 1 tablet by mouth every morning.  Maxwell Caul Bicarbonate (ZEGERID OTC PO) Take 1 capsule by mouth every evening.  [name] Kitchen albuterol (PROVENTIL HFA;VENTOLIN HFA) 108 (90 BASE) MCG/ACT inhaler Inhale 2 puffs into the lungs every 6 (six) hours as needed for wheezing or shortness of breath.   . [DISCONTINUED] Docusate Sodium (DSS) 100 MG CAPS Take 100 mg by mouth daily.

## 2013-11-29 NOTE — Patient Instructions (Signed)
Start taking Dulera 2 puffs twice a day with a spacer Take albuterol 2 puffs every four hours as needed for shortness of breath We will arrange a sleep study Stay active, exercise regularly, and try to lose weight We will see you back in 3 months or sooner if needed

## 2013-11-29 NOTE — Assessment & Plan Note (Signed)
Her echocardiogram showed a mild elevation in the right ventricular systolic pressure. Notably, this is an estimate so we cannot take it as reliable. However, if she does have some pulmonary hypertension it is likely due to obstructive sleep apnea related to her obesity.

## 2013-11-29 NOTE — Assessment & Plan Note (Signed)
This is clearly multifactorial do to her obesity and multiple comorbid medical illnesses. However, I have a strong suspicion for obstructive sleep apnea considering her obesity, her daytime somnolence, and her lack of concentration in the afternoons. She also notes heavy snoring as well as frequent awakenings at night.  Plan: -Advised to lose weight -No driving while sleepy -Split-night study

## 2013-12-03 ENCOUNTER — Ambulatory Visit: Payer: Worker's Compensation | Attending: Orthopedic Surgery | Admitting: Physical Therapy

## 2013-12-05 ENCOUNTER — Ambulatory Visit: Payer: Worker's Compensation | Admitting: Physical Therapy

## 2014-01-28 ENCOUNTER — Encounter (HOSPITAL_BASED_OUTPATIENT_CLINIC_OR_DEPARTMENT_OTHER): Payer: Self-pay

## 2014-04-13 ENCOUNTER — Other Ambulatory Visit: Payer: Self-pay | Admitting: Internal Medicine

## 2014-04-14 NOTE — Telephone Encounter (Signed)
Rx refill sent to patient pharmacy   

## 2014-10-23 ENCOUNTER — Emergency Department (HOSPITAL_COMMUNITY)
Admission: EM | Admit: 2014-10-23 | Discharge: 2014-10-24 | Disposition: A | Discharge status: Home / Self Care | Payer: Medicare HMO | Attending: Emergency Medicine | Admitting: Emergency Medicine

## 2014-10-23 ENCOUNTER — Encounter (HOSPITAL_COMMUNITY): Payer: Self-pay

## 2014-10-23 DIAGNOSIS — Z7982 Long term (current) use of aspirin: Secondary | ICD-10-CM | POA: Diagnosis not present

## 2014-10-23 DIAGNOSIS — I10 Essential (primary) hypertension: Secondary | ICD-10-CM | POA: Insufficient documentation

## 2014-10-23 DIAGNOSIS — Z8639 Personal history of other endocrine, nutritional and metabolic disease: Secondary | ICD-10-CM | POA: Insufficient documentation

## 2014-10-23 DIAGNOSIS — Z9889 Other specified postprocedural states: Secondary | ICD-10-CM | POA: Diagnosis not present

## 2014-10-23 DIAGNOSIS — Z791 Long term (current) use of non-steroidal anti-inflammatories (NSAID): Secondary | ICD-10-CM | POA: Insufficient documentation

## 2014-10-23 DIAGNOSIS — R42 Dizziness and giddiness: Secondary | ICD-10-CM | POA: Insufficient documentation

## 2014-10-23 DIAGNOSIS — R197 Diarrhea, unspecified: Secondary | ICD-10-CM

## 2014-10-23 DIAGNOSIS — M1990 Unspecified osteoarthritis, unspecified site: Secondary | ICD-10-CM | POA: Insufficient documentation

## 2014-10-23 DIAGNOSIS — Z8669 Personal history of other diseases of the nervous system and sense organs: Secondary | ICD-10-CM | POA: Insufficient documentation

## 2014-10-23 DIAGNOSIS — K219 Gastro-esophageal reflux disease without esophagitis: Secondary | ICD-10-CM | POA: Insufficient documentation

## 2014-10-23 DIAGNOSIS — Z7951 Long term (current) use of inhaled steroids: Secondary | ICD-10-CM | POA: Diagnosis not present

## 2014-10-23 DIAGNOSIS — R1084 Generalized abdominal pain: Secondary | ICD-10-CM | POA: Diagnosis not present

## 2014-10-23 DIAGNOSIS — Z79899 Other long term (current) drug therapy: Secondary | ICD-10-CM | POA: Insufficient documentation

## 2014-10-23 DIAGNOSIS — J449 Chronic obstructive pulmonary disease, unspecified: Secondary | ICD-10-CM | POA: Insufficient documentation

## 2014-10-23 DIAGNOSIS — Z8701 Personal history of pneumonia (recurrent): Secondary | ICD-10-CM | POA: Insufficient documentation

## 2014-10-23 MED ORDER — SODIUM CHLORIDE 0.9 % IV BOLUS (SEPSIS)
1000.0000 mL | Freq: Once | INTRAVENOUS | Status: AC
  Administered 2014-10-24: 1000 mL via INTRAVENOUS

## 2014-10-23 MED ORDER — ACETAMINOPHEN 500 MG PO TABS
1000.0000 mg | ORAL_TABLET | Freq: Once | ORAL | Status: AC
  Administered 2014-10-24: 1000 mg via ORAL
  Filled 2014-10-23: qty 2

## 2014-10-23 NOTE — ED Notes (Signed)
Pt complains of diarrhea for two days

## 2014-10-24 LAB — CBC WITH DIFFERENTIAL/PLATELET
Basophils Absolute: 0 10*3/uL (ref 0.0–0.1)
Basophils Relative: 0 % (ref 0–1)
Eosinophils Absolute: 0 10*3/uL (ref 0.0–0.7)
Eosinophils Relative: 0 % (ref 0–5)
HCT: 44.5 % (ref 36.0–46.0)
Hemoglobin: 14.3 g/dL (ref 12.0–15.0)
Lymphocytes Relative: 11 % — ABNORMAL LOW (ref 12–46)
Lymphs Abs: 1.3 10*3/uL (ref 0.7–4.0)
MCH: 28.2 pg (ref 26.0–34.0)
MCHC: 32.1 g/dL (ref 30.0–36.0)
MCV: 87.8 fL (ref 78.0–100.0)
Monocytes Absolute: 0.9 10*3/uL (ref 0.1–1.0)
Monocytes Relative: 8 % (ref 3–12)
Neutro Abs: 9.7 10*3/uL — ABNORMAL HIGH (ref 1.7–7.7)
Neutrophils Relative %: 81 % — ABNORMAL HIGH (ref 43–77)
Platelets: 240 10*3/uL (ref 150–400)
RBC: 5.07 MIL/uL (ref 3.87–5.11)
RDW: 14.1 % (ref 11.5–15.5)
WBC: 12 10*3/uL — ABNORMAL HIGH (ref 4.0–10.5)

## 2014-10-24 LAB — COMPREHENSIVE METABOLIC PANEL
ALT: 16 U/L (ref 14–54)
AST: 21 U/L (ref 15–41)
Albumin: 3.9 g/dL (ref 3.5–5.0)
Alkaline Phosphatase: 71 U/L (ref 38–126)
Anion gap: 11 (ref 5–15)
BUN: 15 mg/dL (ref 6–20)
CO2: 24 mmol/L (ref 22–32)
Calcium: 8.7 mg/dL — ABNORMAL LOW (ref 8.9–10.3)
Chloride: 103 mmol/L (ref 101–111)
Creatinine, Ser: 1.02 mg/dL — ABNORMAL HIGH (ref 0.44–1.00)
GFR calc Af Amer: >60 mL/min (ref >60)
GFR calc non Af Amer: 56 mL/min — ABNORMAL LOW (ref >60)
Glucose, Bld: 136 mg/dL — ABNORMAL HIGH (ref 65–99)
Potassium: 3.2 mmol/L — ABNORMAL LOW (ref 3.5–5.1)
Sodium: 138 mmol/L (ref 135–145)
Total Bilirubin: 0.8 mg/dL (ref 0.3–1.2)
Total Protein: 7.1 g/dL (ref 6.5–8.1)

## 2014-10-24 LAB — I-STAT CHEM 8, ED
BUN: 18 mg/dL (ref 6–20)
Calcium, Ion: 1.04 mmol/L — ABNORMAL LOW (ref 1.13–1.30)
Chloride: 101 mmol/L (ref 101–111)
Creatinine, Ser: 1.1 mg/dL — ABNORMAL HIGH (ref 0.44–1.00)
Glucose, Bld: 133 mg/dL — ABNORMAL HIGH (ref 65–99)
HCT: 46 % (ref 36.0–46.0)
Hemoglobin: 15.6 g/dL — ABNORMAL HIGH (ref 12.0–15.0)
Potassium: 3.6 mmol/L (ref 3.5–5.1)
Sodium: 137 mmol/L (ref 135–145)
TCO2: 23 mmol/L (ref 0–100)

## 2014-10-24 LAB — LIPASE, BLOOD: Lipase: 26 U/L (ref 22–51)

## 2014-10-24 LAB — I-STAT CG4 LACTIC ACID, ED: Lactic Acid, Venous: 1.09 mmol/L (ref 0.5–2.0)

## 2014-10-24 MED ORDER — HYOSCYAMINE SULFATE 0.5 MG/ML IJ SOLN
0.1250 mg | Freq: Once | INTRAMUSCULAR | Status: AC
  Administered 2014-10-24: 0.125 mg via INTRAVENOUS
  Filled 2014-10-24: qty 0.25

## 2014-10-24 MED ORDER — DIPHENOXYLATE-ATROPINE 2.5-0.025 MG PO TABS: 2.0000 | ORAL_TABLET | Freq: Four times a day (QID) | ORAL | Status: DC | PRN

## 2014-10-24 MED ORDER — DIPHENOXYLATE-ATROPINE 2.5-0.025 MG PO TABS
2.0000 | ORAL_TABLET | Freq: Once | ORAL | Status: AC
  Administered 2014-10-24: 2 via ORAL
  Filled 2014-10-24: qty 2

## 2014-10-24 NOTE — ED Notes (Signed)
Pt repeatedly walked to restroom with no assistance and with a steady gait.

## 2014-10-24 NOTE — ED Provider Notes (Signed)
CSN: [id number]     Arrival date & time 10/23/14  2254 History   First MD Initiated Contact with Patient 10/23/14 2355     Chief Complaint  Patient presents with  . Diarrhea     (Consider location/radiation/quality/duration/timing/severity/associated sxs/prior Treatment) HPI Comments: Patient with a history of COPD presents with diarrhea and abdominal cramping for 2 days. No blood in her bowel movements. No fever, nausea or vomiting. She states she ate lunch with a friend one day prior to onset of symptoms and her friend is experiencing similar symptoms. No recent antibiotic use. She came in for evaluation because she started to feel dizzy when she would get up to walk. No falls, or syncope.   The history is provided by the patient. No language interpreter was used.    Past Medical History  Diagnosis Date  . Degeneration of intervertebral disc, site unspecified   . Dyslipidemia   . DJD (degenerative joint disease)   . Asthma   . COPD (chronic obstructive pulmonary disease)     questionable  . Depression   . Sleep apnea     stopbang=4  . Unspecified essential hypertension   . GERD (gastroesophageal reflux disease)   . Pneumonia 2/15  . Shortness of breath     on exertion   Past Surgical History  Procedure Laterality Date  . Appendectomy    . Ectopic pregnancy surgery    . Knee arthroscopy  2010     Right knee  . Knee arthroscopy  2012    Left knee  . 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 Total knee revision Left 09/23/2013    Procedure: REVISION LEFT TOTAL KNEE WITH POLY EXCHANGE UPSIZE AND PATELLA REVISION;  Surgeon: Shelda Pal, MD;  Location: [facility];  Service: Orthopedics;  Laterality: Left;   Family History  Problem Relation Age of Onset  . Lung cancer Father   . COPD Mother   . Hypertension Mother   . Alcohol abuse Brother   .  Melanoma Brother    Social History  Substance Use Topics  . Smoking status: Passive Smoke Exposure - Never Smoker  . Smokeless tobacco: Never Used     Comment: both parents smoked  . Alcohol Use: 0.6 oz/week    1 Glasses of wine per week     Comment: occasional glass of wine   OB History    No data available     Review of Systems  Constitutional: Negative for fever and chills.  HENT: Negative.   Respiratory: Negative.   Cardiovascular: Negative.   Gastrointestinal: Positive for abdominal pain and diarrhea. Negative for nausea, vomiting and abdominal distention.  Genitourinary: Negative.  Negative for dysuria and decreased urine volume.  Musculoskeletal: Negative.   Skin: Negative.   Neurological: Positive for light-headedness. Negative for syncope.      Allergies  Morphine and related  Home Medications   Prior to Admission medications   Medication Sig Start Date End Date Taking? Authorizing Provider  albuterol (PROVENTIL HFA;VENTOLIN HFA) 108 (90 BASE) MCG/ACT inhaler Inhale 1-2 puffs into the lungs every 6 (six) hours as needed for wheezing or shortness of breath. 11/29/13  Yes Lupita Leash, MD  amLODipine (NORVASC) 5 MG tablet Take 5 mg by mouth every morning.   Yes Historical Provider, MD  aspirin 81 MG tablet Take 81 mg by mouth daily.  Yes Historical Provider, MD  calcium-vitamin D 250-100 MG-UNIT per tablet Take 1 tablet by mouth daily.   Yes Historical Provider, MD  cloNIDine (CATAPRES) 0.2 MG tablet Take 0.2 mg by mouth at bedtime.    Yes Historical Provider, MD  cyclobenzaprine (FLEXERIL) 10 MG tablet Take 5 mg by mouth 3 (three) times daily as needed for muscle spasms.   Yes Historical Provider, MD  meclizine (ANTIVERT) 25 MG tablet Take 1 tablet (25 mg total) by mouth 3 (three) times daily as needed for dizziness. 07/30/13  Yes Geoffery Lyons, MD  meloxicam (MOBIC) 15 MG tablet Take 15 mg by mouth daily.    Yes Historical Provider, MD  mometasone-formoterol  (DULERA) 100-5 MCG/ACT AERO Inhale 2 puffs into the lungs 2 (two) times daily. 11/29/13  Yes Lupita Leash, MD  olmesartan-hydrochlorothiazide (BENICAR HCT) 40-25 MG per tablet Take 1 tablet by mouth every morning.   Yes Historical Provider, MD  Omeprazole-Sodium Bicarbonate (ZEGERID OTC PO) Take 1 capsule by mouth every evening.   Yes Historical Provider, MD  traMADol (ULTRAM) 50 MG tablet Take 50 mg by mouth every 6 (six) hours as needed.   Yes Historical Provider, MD  amLODipine (NORVASC) 5 MG tablet TAKE 1 TABLET BY MOUTH EVERY DAY Patient not taking: Reported on 10/23/2014 04/14/14   Chrystie Nose, MD  BENICAR HCT 40-25 MG per tablet TAKE 1 TABLET BY MOUTH EVERY DAY Patient not taking: Reported on 10/23/2014 04/14/14   Chrystie Nose, MD  ferrous sulfate 325 (65 FE) MG tablet Take 1 tablet (325 mg total) by mouth 3 (three) times daily after meals. Patient not taking: Reported on 10/23/2014 09/24/13   Lanney Gins, PA-C  HYDROcodone-acetaminophen (NORCO) 7.5-325 MG per tablet Take 1-2 tablets by mouth every 4 (four) hours as needed for moderate pain. Patient not taking: Reported on 10/23/2014 09/24/13   Lanney Gins, PA-C  Spacer/Aero-Holding Chambers (AEROCHAMBER Z-STAT PLUS) inhaler Use as instructed 11/29/13   Lupita Leash, MD   BP 119/76 mmHg  Pulse 93  Temp(Src) 100.6 F (38.1 C) (Oral)  Resp 15  SpO2 95% Physical Exam  Constitutional: She is oriented to person, place, and time. She appears well-developed and well-nourished.  HENT:  Mouth/Throat: Mucous membranes are dry.  Neck: Normal range of motion.  Pulmonary/Chest: Effort normal.  Abdominal: Soft.  Abdomen is diffusely tender. Soft. Non-distended.   Neurological: She is alert and oriented to person, place, and time.  Skin: Skin is warm and dry.    ED Course  Procedures (including critical care time) Labs Review Labs Reviewed  CBC WITH DIFFERENTIAL/PLATELET - Abnormal; Notable for the following:    WBC 12.0 (*)     Neutrophils Relative % 81 (*)    Neutro Abs 9.7 (*)    Lymphocytes Relative 11 (*)    All other components within normal limits  COMPREHENSIVE METABOLIC PANEL - Abnormal; Notable for the following:    Potassium 3.2 (*)    Glucose, Bld 136 (*)    Creatinine, Ser 1.02 (*)    Calcium 8.7 (*)    GFR calc non Af Amer 56 (*)    All other components within normal limits  I-STAT CHEM 8, ED - Abnormal; Notable for the following:    Creatinine, Ser 1.10 (*)    Glucose, Bld 133 (*)    Calcium, Ion 1.04 (*)    Hemoglobin 15.6 (*)    All other components within normal limits  STOOL CULTURE  LIPASE, BLOOD  URINALYSIS, ROUTINE  W REFLEX MICROSCOPIC (NOT AT [facility])  I-STAT CG4 LACTIC ACID, ED   Results for orders placed or performed during the hospital encounter of 10/23/14  CBC with Differential/Platelet  Result Value Ref Range   WBC 12.0 (H) 4.0 - 10.5 K/uL   RBC 5.07 3.87 - 5.11 MIL/uL   Hemoglobin 14.3 12.0 - 15.0 g/dL   HCT 16.1 09.6 - 04.5 %   MCV 87.8 78.0 - 100.0 fL   MCH 28.2 26.0 - 34.0 pg   MCHC 32.1 30.0 - 36.0 g/dL   RDW 40.9 81.1 - 91.4 %   Platelets 240 150 - 400 K/uL   Neutrophils Relative % 81 (H) 43 - 77 %   Neutro Abs 9.7 (H) 1.7 - 7.7 K/uL   Lymphocytes Relative 11 (L) 12 - 46 %   Lymphs Abs 1.3 0.7 - 4.0 K/uL   Monocytes Relative 8 3 - 12 %   Monocytes Absolute 0.9 0.1 - 1.0 K/uL   Eosinophils Relative 0 0 - 5 %   Eosinophils Absolute 0.0 0.0 - 0.7 K/uL   Basophils Relative 0 0 - 1 %   Basophils Absolute 0.0 0.0 - 0.1 K/uL  Comprehensive metabolic panel  Result Value Ref Range   Sodium 138 135 - 145 mmol/L   Potassium 3.2 (L) 3.5 - 5.1 mmol/L   Chloride 103 101 - 111 mmol/L   CO2 24 22 - 32 mmol/L   Glucose, Bld 136 (H) 65 - 99 mg/dL   BUN 15 6 - 20 mg/dL   Creatinine, Ser 7.82 (H) 0.44 - 1.00 mg/dL   Calcium 8.7 (L) 8.9 - 10.3 mg/dL   Total Protein 7.1 6.5 - 8.1 g/dL   Albumin 3.9 3.5 - 5.0 g/dL   AST 21 15 - 41 U/L   ALT 16 14 - 54 U/L   Alkaline  Phosphatase 71 38 - 126 U/L   Total Bilirubin 0.8 0.3 - 1.2 mg/dL   GFR calc non Af Amer 56 (L) >60 mL/min   GFR calc Af Amer >60 >60 mL/min   Anion gap 11 5 - 15  Lipase, blood  Result Value Ref Range   Lipase 26 22 - 51 U/L  I-stat chem 8, ed  Result Value Ref Range   Sodium 137 135 - 145 mmol/L   Potassium 3.6 3.5 - 5.1 mmol/L   Chloride 101 101 - 111 mmol/L   BUN 18 6 - 20 mg/dL   Creatinine, Ser 9.56 (H) 0.44 - 1.00 mg/dL   Glucose, Bld 213 (H) 65 - 99 mg/dL   Calcium, Ion 0.86 (L) 1.13 - 1.30 mmol/L   TCO2 23 0 - 100 mmol/L   Hemoglobin 15.6 (H) 12.0 - 15.0 g/dL   HCT 57.8 46.9 - 62.9 %  I-Stat CG4 Lactic Acid, ED  Result Value Ref Range   Lactic Acid, Venous 1.09 0.5 - 2.0 mmol/L     Imaging Review No results found. I, Uriah Trueba A, personally reviewed and evaluated these images and lab results as part of my medical decision-making.   EKG Interpretation None      MDM   Final diagnoses:  None    1. Diarrhea  She is feeling much better after IV fluids. She is given Lomotil and has had less frequent bowel movements. No longer dizzy when she walks - has been to and from the bathroom multiple times without symptoms of dizziness. She feels comfortable with discharge home.    Elpidio Anis, PA-C 10/24/14 [phone]  Tomasita Crumble, MD 10/24/14 [phone]

## 2014-10-24 NOTE — ED Notes (Signed)
Pt ambulating independently w/ steady gait on d/c in no acute distress, A&Ox4. D/c instructions reviewed w/ pt - pt denies any further questions or concerns at present. Rx given x1  

## 2014-10-24 NOTE — Discharge Instructions (Signed)
Food Choices to Help Relieve Diarrhea °When you have diarrhea, the foods you eat and your eating habits are very important. Choosing the right foods and drinks can help relieve diarrhea. Also, because diarrhea can last up to 7 days, you need to replace lost fluids and electrolytes (such as sodium, potassium, and chloride) in order to help prevent dehydration.  °WHAT GENERAL GUIDELINES DO I NEED TO FOLLOW? °· Slowly drink 1 cup (8 oz) of fluid for each episode of diarrhea. If you are getting enough fluid, your urine will be clear or pale yellow. °· Eat starchy foods. Some good choices include white rice, white toast, pasta, low-fiber cereal, baked potatoes (without the skin), saltine crackers, and bagels. °· Avoid large servings of any cooked vegetables. °· Limit fruit to two servings per day. A serving is ½ cup or 1 small piece. °· Choose foods with less than 2 g of fiber per serving. °· Limit fats to less than 8 tsp (38 g) per day. °· Avoid fried foods. °· Eat foods that have probiotics in them. Probiotics can be found in certain dairy products. °· Avoid foods and beverages that may increase the speed at which food moves through the stomach and intestines (gastrointestinal tract). Things to avoid include: °¨ High-fiber foods, such as dried fruit, raw fruits and vegetables, nuts, seeds, and whole grain foods. °¨ Spicy foods and high-fat foods. °¨ Foods and beverages sweetened with high-fructose corn syrup, honey, or sugar alcohols such as xylitol, sorbitol, and mannitol. °WHAT FOODS ARE RECOMMENDED? °Grains °White rice. White, French, or pita breads (fresh or toasted), including plain rolls, buns, or bagels. White pasta. Saltine, soda, or graham crackers. Pretzels. Low-fiber cereal. Cooked cereals made with water (such as cornmeal, farina, or cream cereals). Plain muffins. Matzo. Melba toast. Zwieback.  °Vegetables °Potatoes (without the skin). Strained tomato and vegetable juices. Most well-cooked and canned  vegetables without seeds. Tender lettuce. °Fruits °Cooked or canned applesauce, apricots, cherries, fruit cocktail, grapefruit, peaches, pears, or plums. Fresh bananas, apples without skin, cherries, grapes, cantaloupe, grapefruit, peaches, oranges, or plums.  °Meat and Other Protein Products °Baked or boiled chicken. Eggs. Tofu. Fish. Seafood. Smooth peanut butter. Ground or well-cooked tender beef, ham, veal, lamb, pork, or poultry.  °Dairy °Plain yogurt, kefir, and unsweetened liquid yogurt. Lactose-free milk, buttermilk, or soy milk. Plain hard cheese. °Beverages °Sport drinks. Clear broths. Diluted fruit juices (except prune). Regular, caffeine-free sodas such as ginger ale. Water. Decaffeinated teas. Oral rehydration solutions. Sugar-free beverages not sweetened with sugar alcohols. °Other °Bouillon, broth, or soups made from recommended foods.  °The items listed above may not be a complete list of recommended foods or beverages. Contact your dietitian for more options. °WHAT FOODS ARE NOT RECOMMENDED? °Grains °Whole grain, whole wheat, bran, or rye breads, rolls, pastas, crackers, and cereals. Wild or brown rice. Cereals that contain more than 2 g of fiber per serving. Corn tortillas or taco shells. Cooked or dry oatmeal. Granola. Popcorn. °Vegetables °Raw vegetables. Cabbage, broccoli, Brussels sprouts, artichokes, baked beans, beet greens, corn, kale, legumes, peas, sweet potatoes, and yams. Potato skins. Cooked spinach and cabbage. °Fruits °Dried fruit, including raisins and dates. Raw fruits. Stewed or dried prunes. Fresh apples with skin, apricots, mangoes, pears, raspberries, and strawberries.  °Meat and Other Protein Products °Chunky peanut butter. Nuts and seeds. Beans and lentils. Bacon.  °Dairy °High-fat cheeses. Milk, chocolate milk, and beverages made with milk, such as milk shakes. Cream. Ice cream. °Sweets and Desserts °Sweet rolls, doughnuts, and sweet breads. Pancakes   and waffles. °Fats and  Oils °Butter. Cream sauces. Margarine. Salad oils. Plain salad dressings. Olives. Avocados.  °Beverages °Caffeinated beverages (such as coffee, tea, soda, or energy drinks). Alcoholic beverages. Fruit juices with pulp. Prune juice. Soft drinks sweetened with high-fructose corn syrup or sugar alcohols. °Other °Coconut. Hot sauce. Chili powder. Mayonnaise. Gravy. Cream-based or milk-based soups.  °The items listed above may not be a complete list of foods and beverages to avoid. Contact your dietitian for more information. °WHAT SHOULD I DO IF I BECOME DEHYDRATED? °Diarrhea can sometimes lead to dehydration. Signs of dehydration include dark urine and dry mouth and skin. If you think you are dehydrated, you should rehydrate with an oral rehydration solution. These solutions can be purchased at pharmacies, retail stores, or online.  °Drink ½-1 cup (120-240 mL) of oral rehydration solution each time you have an episode of diarrhea. If drinking this amount makes your diarrhea worse, try drinking smaller amounts more often. For example, drink 1-3 tsp (5-15 mL) every 5-10 minutes.  °A general rule for staying hydrated is to drink 1½-2 L of fluid per day. Talk to your health care provider about the specific amount you should be drinking each day. Drink enough fluids to keep your urine clear or pale yellow. °Document Released: 05/21/2003 Document Revised: 03/05/2013 Document Reviewed: 01/21/2013 °ExitCare® Patient Information ©2015 [employer]. This information is not intended to replace advice given to you by your health care provider. Make sure you discuss any questions you have with your health care provider. °Diarrhea °Diarrhea is frequent loose and watery bowel movements. It can cause you to feel weak and dehydrated. Dehydration can cause you to become tired and thirsty, have a dry mouth, and have decreased urination that often is dark yellow. Diarrhea is a sign of another problem, most often an infection that will  not last long. In most cases, diarrhea typically lasts 2-3 days. However, it can last longer if it is a sign of something more serious. It is important to treat your diarrhea as directed by your caregiver to lessen or prevent future episodes of diarrhea. °CAUSES  °Some common causes include: °· Gastrointestinal infections caused by viruses, bacteria, or parasites. °· Food poisoning or food allergies. °· Certain medicines, such as antibiotics, chemotherapy, and laxatives. °· Artificial sweeteners and fructose. °· Digestive disorders. °HOME CARE INSTRUCTIONS °· Ensure adequate fluid intake (hydration): Have 1 cup (8 oz) of fluid for each diarrhea episode. Avoid fluids that contain simple sugars or sports drinks, fruit juices, whole milk products, and sodas. Your urine should be clear or pale yellow if you are drinking enough fluids. Hydrate with an oral rehydration solution that you can purchase at pharmacies, retail stores, and online. You can prepare an oral rehydration solution at home by mixing the following ingredients together: °¨  - tsp table salt. °¨ ¾ tsp baking soda. °¨  tsp salt substitute containing potassium chloride. °¨ 1  tablespoons sugar. °¨ 1 L (34 oz) of water. °· Certain foods and beverages may increase the speed at which food moves through the gastrointestinal (GI) tract. These foods and beverages should be avoided and include: °¨ Caffeinated and alcoholic beverages. °¨ High-fiber foods, such as raw fruits and vegetables, nuts, seeds, and whole grain breads and cereals. °¨ Foods and beverages sweetened with sugar alcohols, such as xylitol, sorbitol, and mannitol. °· Some foods may be well tolerated and may help thicken stool including: °¨ Starchy foods, such as rice, toast, pasta, low-sugar cereal, oatmeal, grits, baked potatoes,   crackers, and bagels. °¨ Bananas. °¨ Applesauce. °· Add probiotic-rich foods to help increase healthy bacteria in the GI tract, such as yogurt and fermented milk  products. °· Wash your hands well after each diarrhea episode. °· Only take over-the-counter or prescription medicines as directed by your caregiver. °· Take a warm bath to relieve any burning or pain from frequent diarrhea episodes. °SEEK IMMEDIATE MEDICAL CARE IF:  °· You are unable to keep fluids down. °· You have persistent vomiting. °· You have blood in your stool, or your stools are black and tarry. °· You do not urinate in 6-8 hours, or there is only a small amount of very dark urine. °· You have abdominal pain that increases or localizes. °· You have weakness, dizziness, confusion, or light-headedness. °· You have a severe headache. °· Your diarrhea gets worse or does not get better. °· You have a fever or persistent symptoms for more than 2-3 days. °· You have a fever and your symptoms suddenly get worse. °MAKE SURE YOU:  °· Understand these instructions. °· Will watch your condition. °· Will get help right away if you are not doing well or get worse. °Document Released: 02/18/2002 Document Revised: 07/15/2013 Document Reviewed: 11/06/2011 °ExitCare® Patient Information ©2015 [employer]. This information is not intended to replace advice given to you by your health care provider. Make sure you discuss any questions you have with your health care provider. ° °

## 2014-10-25 ENCOUNTER — Emergency Department (HOSPITAL_BASED_OUTPATIENT_CLINIC_OR_DEPARTMENT_OTHER): Payer: Medicare HMO

## 2014-10-25 ENCOUNTER — Encounter (HOSPITAL_BASED_OUTPATIENT_CLINIC_OR_DEPARTMENT_OTHER): Payer: Self-pay | Admitting: *Deleted

## 2014-10-25 ENCOUNTER — Emergency Department (HOSPITAL_BASED_OUTPATIENT_CLINIC_OR_DEPARTMENT_OTHER)
Admission: EM | Admit: 2014-10-25 | Discharge: 2014-10-25 | Disposition: A | Discharge status: Home / Self Care | Payer: Medicare HMO | Attending: Emergency Medicine | Admitting: Emergency Medicine

## 2014-10-25 DIAGNOSIS — I10 Essential (primary) hypertension: Secondary | ICD-10-CM | POA: Diagnosis not present

## 2014-10-25 DIAGNOSIS — J449 Chronic obstructive pulmonary disease, unspecified: Secondary | ICD-10-CM | POA: Diagnosis not present

## 2014-10-25 DIAGNOSIS — F329 Major depressive disorder, single episode, unspecified: Secondary | ICD-10-CM | POA: Insufficient documentation

## 2014-10-25 DIAGNOSIS — K529 Noninfective gastroenteritis and colitis, unspecified: Secondary | ICD-10-CM | POA: Insufficient documentation

## 2014-10-25 DIAGNOSIS — Z79899 Other long term (current) drug therapy: Secondary | ICD-10-CM | POA: Diagnosis not present

## 2014-10-25 DIAGNOSIS — Z7951 Long term (current) use of inhaled steroids: Secondary | ICD-10-CM | POA: Insufficient documentation

## 2014-10-25 DIAGNOSIS — R197 Diarrhea, unspecified: Secondary | ICD-10-CM | POA: Diagnosis present

## 2014-10-25 DIAGNOSIS — K219 Gastro-esophageal reflux disease without esophagitis: Secondary | ICD-10-CM | POA: Insufficient documentation

## 2014-10-25 DIAGNOSIS — Z8701 Personal history of pneumonia (recurrent): Secondary | ICD-10-CM | POA: Insufficient documentation

## 2014-10-25 DIAGNOSIS — E785 Hyperlipidemia, unspecified: Secondary | ICD-10-CM | POA: Insufficient documentation

## 2014-10-25 DIAGNOSIS — Z8739 Personal history of other diseases of the musculoskeletal system and connective tissue: Secondary | ICD-10-CM | POA: Diagnosis not present

## 2014-10-25 DIAGNOSIS — Z7982 Long term (current) use of aspirin: Secondary | ICD-10-CM | POA: Insufficient documentation

## 2014-10-25 LAB — COMPREHENSIVE METABOLIC PANEL
ALT: 21 U/L (ref 14–54)
AST: 25 U/L (ref 15–41)
Albumin: 3.6 g/dL (ref 3.5–5.0)
Alkaline Phosphatase: 69 U/L (ref 38–126)
Anion gap: 9 (ref 5–15)
BUN: 12 mg/dL (ref 6–20)
CO2: 26 mmol/L (ref 22–32)
Calcium: 8.7 mg/dL — ABNORMAL LOW (ref 8.9–10.3)
Chloride: 106 mmol/L (ref 101–111)
Creatinine, Ser: 0.75 mg/dL (ref 0.44–1.00)
GFR calc Af Amer: >60 mL/min (ref >60)
GFR calc non Af Amer: >60 mL/min (ref >60)
Glucose, Bld: 111 mg/dL — ABNORMAL HIGH (ref 65–99)
Potassium: 3.2 mmol/L — ABNORMAL LOW (ref 3.5–5.1)
Sodium: 141 mmol/L (ref 135–145)
Total Bilirubin: 0.6 mg/dL (ref 0.3–1.2)
Total Protein: 6.9 g/dL (ref 6.5–8.1)

## 2014-10-25 LAB — CBC WITH DIFFERENTIAL/PLATELET
Basophils Absolute: 0 10*3/uL (ref 0.0–0.1)
Basophils Relative: 0 % (ref 0–1)
Eosinophils Absolute: 0.2 10*3/uL (ref 0.0–0.7)
Eosinophils Relative: 2 % (ref 0–5)
HCT: 42.7 % (ref 36.0–46.0)
Hemoglobin: 13.6 g/dL (ref 12.0–15.0)
Lymphocytes Relative: 26 % (ref 12–46)
Lymphs Abs: 1.8 10*3/uL (ref 0.7–4.0)
MCH: 27.9 pg (ref 26.0–34.0)
MCHC: 31.9 g/dL (ref 30.0–36.0)
MCV: 87.5 fL (ref 78.0–100.0)
Monocytes Absolute: 1 10*3/uL (ref 0.1–1.0)
Monocytes Relative: 15 % — ABNORMAL HIGH (ref 3–12)
Neutro Abs: 3.9 10*3/uL (ref 1.7–7.7)
Neutrophils Relative %: 57 % (ref 43–77)
Platelets: 221 10*3/uL (ref 150–400)
RBC: 4.88 MIL/uL (ref 3.87–5.11)
RDW: 13.8 % (ref 11.5–15.5)
WBC: 6.9 10*3/uL (ref 4.0–10.5)

## 2014-10-25 LAB — URINALYSIS, ROUTINE W REFLEX MICROSCOPIC
Bilirubin Urine: NEGATIVE
Glucose, UA: NEGATIVE mg/dL
Hgb urine dipstick: NEGATIVE
Ketones, ur: NEGATIVE mg/dL
Leukocytes, UA: NEGATIVE
Nitrite: NEGATIVE
Protein, ur: NEGATIVE mg/dL
Specific Gravity, Urine: 1.042 — ABNORMAL HIGH (ref 1.005–1.030)
Urobilinogen, UA: 0.2 mg/dL (ref 0.0–1.0)
pH: 6 (ref 5.0–8.0)

## 2014-10-25 MED ORDER — IOHEXOL 300 MG/ML  SOLN
50.0000 mL | Freq: Once | INTRAMUSCULAR | Status: AC | PRN
  Administered 2014-10-25: 50 mL via ORAL

## 2014-10-25 MED ORDER — POTASSIUM CHLORIDE CRYS ER 20 MEQ PO TBCR
40.0000 meq | EXTENDED_RELEASE_TABLET | Freq: Once | ORAL | Status: AC
  Administered 2014-10-25: 40 meq via ORAL
  Filled 2014-10-25: qty 2

## 2014-10-25 MED ORDER — IOHEXOL 300 MG/ML  SOLN
100.0000 mL | Freq: Once | INTRAMUSCULAR | Status: AC | PRN
  Administered 2014-10-25: 100 mL via INTRAVENOUS

## 2014-10-25 MED ORDER — SODIUM CHLORIDE 0.9 % IV BOLUS (SEPSIS)
1000.0000 mL | Freq: Once | INTRAVENOUS | Status: AC
  Administered 2014-10-25: 1000 mL via INTRAVENOUS

## 2014-10-25 MED ORDER — METRONIDAZOLE 500 MG PO TABS: 500.0000 mg | ORAL_TABLET | Freq: Three times a day (TID) | ORAL | Status: DC

## 2014-10-25 MED ORDER — CIPROFLOXACIN HCL 500 MG PO TABS: 500.0000 mg | ORAL_TABLET | Freq: Two times a day (BID) | ORAL | Status: DC

## 2014-10-25 NOTE — ED Provider Notes (Signed)
CSN: [id number]     Arrival date & time 10/25/14  1821 History  This chart was scribed for Blake Divine, MD by Placido Sou, ED scribe. This patient was seen in room MH09/MH09 and the patient's care was started at 6:39 PM.   Chief Complaint  Patient presents with  . Diarrhea   Patient is a 64 y.o. female presenting with diarrhea. The history is provided by the patient. No language interpreter was used.  Diarrhea Quality:  Watery Severity:  Moderate Onset quality:  Sudden Number of episodes:  4x today Duration:  4 days Timing:  Constant Progression:  Unchanged Relieved by:  Nothing Ineffective treatments:  Liquids Associated symptoms: abdominal pain   Associated symptoms: no chills, no fever and no vomiting   Risk factors: suspect food intake     HPI Comments: Kathryn Olsen is a 64 y.o. female, with a hx of GERD and a shx of an appendectomy, who presents to the Emergency Department complaining of intermittent, moderate, diarrhea with onset 4 day ago. Pt notes that she began experiencing intermittent diarrhea s/p eating at [employer] that worsened which caused her to come to [facility] 2 days ago. Since leaving [facility] her symptoms have not improved. Pt describes her BM's as initially like a jelly and now more water-like, notes 4x diarrhea today and complains of associated abd cramping. She notes that her friend who also ate at [employer] experienced similar symptoms, was also seen at the hospital and her symptoms have all but alleviated. Pt notes that she was experiencing intermittent fevers (TMAX 102) but doesn't believe she has had one for the past day. She denies having been on any antibiotics recently. She denies bloody stool, nausea or vomiting.   Past Medical History  Diagnosis Date  . Degeneration of intervertebral disc, site unspecified   . Dyslipidemia   . DJD (degenerative joint disease)   . Asthma   . COPD (chronic obstructive pulmonary disease)     questionable   . Depression   . Sleep apnea     stopbang=4  . Unspecified essential hypertension   . GERD (gastroesophageal reflux disease)   . Pneumonia 2/15  . Shortness of breath     on exertion   Past Surgical History  Procedure Laterality Date  . Appendectomy    . Ectopic pregnancy surgery    . Knee arthroscopy  2010     Right knee  . Knee arthroscopy  2012    Left knee  . 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 Total knee revision Left 09/23/2013    Procedure: REVISION LEFT TOTAL KNEE WITH POLY EXCHANGE UPSIZE AND PATELLA REVISION;  Surgeon: Shelda Pal, MD;  Location: [facility];  Service: Orthopedics;  Laterality: Left;   Family History  Problem Relation Age of Onset  . Lung cancer Father   . COPD Mother   . Hypertension Mother   . Alcohol abuse Brother   . Melanoma Brother    Social History  Substance Use Topics  . Smoking status: Passive Smoke Exposure - Never Smoker  . Smokeless tobacco: Never Used     Comment: both parents smoked  . Alcohol Use: 0.6 oz/week    1 Glasses of wine per week     Comment: occasional glass of wine   OB History    No data available  Review of Systems  Constitutional: Negative for fever and chills.  Gastrointestinal: Positive for abdominal pain and diarrhea. Negative for nausea, vomiting, constipation and blood in stool.  All other systems reviewed and are negative.  Allergies  Morphine and related  Home Medications   Prior to Admission medications   Medication Sig Start Date End Date Taking? Authorizing Provider  albuterol (PROVENTIL HFA;VENTOLIN HFA) 108 (90 BASE) MCG/ACT inhaler Inhale 1-2 puffs into the lungs every 6 (six) hours as needed for wheezing or shortness of breath. 11/29/13   Lupita Leash, MD  amLODipine (NORVASC) 5 MG tablet Take 5 mg by mouth every morning.    Historical Provider, MD   amLODipine (NORVASC) 5 MG tablet TAKE 1 TABLET BY MOUTH EVERY DAY Patient not taking: Reported on 10/23/2014 04/14/14   Chrystie Nose, MD  aspirin 81 MG tablet Take 81 mg by mouth daily.    Historical Provider, MD  BENICAR HCT 40-25 MG per tablet TAKE 1 TABLET BY MOUTH EVERY DAY Patient not taking: Reported on 10/23/2014 04/14/14   Chrystie Nose, MD  calcium-vitamin D 250-100 MG-UNIT per tablet Take 1 tablet by mouth daily.    Historical Provider, MD  cloNIDine (CATAPRES) 0.2 MG tablet Take 0.2 mg by mouth at bedtime.     Historical Provider, MD  cyclobenzaprine (FLEXERIL) 10 MG tablet Take 5 mg by mouth 3 (three) times daily as needed for muscle spasms.    Historical Provider, MD  diphenoxylate-atropine (LOMOTIL) 2.5-0.025 MG per tablet Take 2 tablets by mouth 4 (four) times daily as needed for diarrhea or loose stools. 10/24/14   Elpidio Anis, PA-C  ferrous sulfate 325 (65 FE) MG tablet Take 1 tablet (325 mg total) by mouth 3 (three) times daily after meals. Patient not taking: Reported on 10/23/2014 09/24/13   Lanney Gins, PA-C  HYDROcodone-acetaminophen (NORCO) 7.5-325 MG per tablet Take 1-2 tablets by mouth every 4 (four) hours as needed for moderate pain. Patient not taking: Reported on 10/23/2014 09/24/13   Lanney Gins, PA-C  meclizine (ANTIVERT) 25 MG tablet Take 1 tablet (25 mg total) by mouth 3 (three) times daily as needed for dizziness. 07/30/13   Geoffery Lyons, MD  meloxicam (MOBIC) 15 MG tablet Take 15 mg by mouth daily.     Historical Provider, MD  mometasone-formoterol (DULERA) 100-5 MCG/ACT AERO Inhale 2 puffs into the lungs 2 (two) times daily. 11/29/13   Lupita Leash, MD  olmesartan-hydrochlorothiazide (BENICAR HCT) 40-25 MG per tablet Take 1 tablet by mouth every morning.    Historical Provider, MD  Omeprazole-Sodium Bicarbonate (ZEGERID OTC PO) Take 1 capsule by mouth every evening.    Historical Provider, MD  Spacer/Aero-Holding Chambers (AEROCHAMBER Z-STAT PLUS) inhaler  Use as instructed 11/29/13   Lupita Leash, MD  traMADol (ULTRAM) 50 MG tablet Take 50 mg by mouth every 6 (six) hours as needed.    Historical Provider, MD   BP 148/88 mmHg  Pulse 93  Temp(Src) 99.7 F (37.6 C) (Oral)  Resp 18  Ht 5\' 5"  (1.651 m)  Wt 235 lb 3.2 oz (106.686 kg)  BMI 39.14 kg/m2  SpO2 96% Physical Exam  Constitutional: She is oriented to person, place, and time. She appears well-developed and well-nourished. No distress.  HENT:  Head: Normocephalic and atraumatic.  Eyes: Conjunctivae are normal. No scleral icterus.  Neck: Neck supple.  Cardiovascular: Normal rate and intact distal pulses.   Pulmonary/Chest: Effort normal. No stridor. No respiratory distress.  Abdominal: Soft. Normal appearance. She exhibits  no distension. There is tenderness (eneralized, but worse on right.). There is no rigidity, no rebound and no guarding.  Neurological: She is alert and oriented to person, place, and time.  Skin: Skin is warm and dry. No rash noted.  Psychiatric: She has a normal mood and affect. Her behavior is normal.  Nursing note and vitals reviewed.   ED Course  Procedures  DIAGNOSTIC STUDIES: Oxygen Saturation is 96% on RA, normal by my interpretation.    COORDINATION OF CARE: 6:49 PM Discussed treatment plan with pt at bedside and pt agreed to plan.  Labs Review Labs Reviewed  CBC WITH DIFFERENTIAL/PLATELET - Abnormal; Notable for the following:    Monocytes Relative 15 (*)    All other components within normal limits  COMPREHENSIVE METABOLIC PANEL - Abnormal; Notable for the following:    Potassium 3.2 (*)    Glucose, Bld 111 (*)    Calcium 8.7 (*)    All other components within normal limits  URINALYSIS, ROUTINE W REFLEX MICROSCOPIC (NOT AT [facility]) - Abnormal; Notable for the following:    Specific Gravity, Urine 1.042 (*)    All other components within normal limits    Imaging Review Ct Abdomen Pelvis W Contrast  10/25/2014   CLINICAL DATA:  Diarrhea  for 4 days  EXAM: CT ABDOMEN AND PELVIS WITH CONTRAST  TECHNIQUE: Multidetector CT imaging of the abdomen and pelvis was performed using the standard protocol following bolus administration of intravenous contrast.  CONTRAST:  50mL OMNIPAQUE IOHEXOL 300 MG/ML SOLN, OMNIPAQUE IOHEXOL 300 MG/ML SOLN  COMPARISON:  None.  FINDINGS: Lung bases are free of acute infiltrate or sizable effusion. A moderate-sized hiatal hernia is identified.  The liver, gallbladder, spleen, adrenal glands and pancreas are within normal limits. The kidneys are well visualized bilaterally with normal enhancement pattern. Normal excretion of contrast is noted bilaterally.  Mild aortoiliac calcifications are seen. The appendix is not visualized consistent with prior surgical history. The bladder is partially distended. Calcified uterine fibroids are seen. A dominant right ovarian cyst is noted measuring 5.1 cm. Scattered diverticular changes noted. No perforation or abscess is identified. Mild pericolonic inflammatory changes are noted in the ascending colon which may represent some mild diverticulitis. Small right lower quadrant lymph nodes are identified likely reactive in nature. Degenerative changes of the lumbar spine are seen.  IMPRESSION: Diverticulosis with evidence of mild pericolonic inflammatory change in the ascending colon which may represent diverticulitis. No perforation or abscess is seen. Some reactive lymph nodes in the right lower quadrant are noted.  Moderate-sized hiatal hernia.  Right ovarian cyst.  No other focal abnormality is seen.   Electronically Signed   By: Alcide Clever M.D.   On: 10/25/2014 20:33   I, Blake Divine, MD, personally reviewed and evaluated these images and lab results as part of my medical decision-making.   EKG Interpretation None      MDM   Final diagnoses:  Colitis    Will treat her colitis with Cipro and Flagyl.   I personally performed the services described in this  documentation, which was scribed in my presence. The recorded information has been reviewed and is accurate.      Blake Divine, MD 10/25/14 [id number]

## 2014-10-25 NOTE — ED Notes (Addendum)
Diarrhea and abd pain x 5 days- seen at [facility] ED on Thursday- states sx have not improved

## 2014-10-25 NOTE — Discharge Instructions (Signed)

## 2014-10-29 ENCOUNTER — Telehealth (HOSPITAL_COMMUNITY): Payer: Self-pay | Admitting: Emergency Medicine

## 2014-10-29 NOTE — Telephone Encounter (Signed)
Lab called positive stool culture Chart sent to EDP for review.

## 2014-10-31 ENCOUNTER — Other Ambulatory Visit: Payer: Self-pay | Admitting: Internal Medicine

## 2014-10-31 DIAGNOSIS — J4540 Moderate persistent asthma, uncomplicated: Secondary | ICD-10-CM | POA: Insufficient documentation

## 2014-10-31 NOTE — Telephone Encounter (Signed)
Rx(s) sent to pharmacy electronically.  

## 2014-11-04 ENCOUNTER — Encounter: Payer: Self-pay | Admitting: Gastroenterology

## 2014-11-04 LAB — STOOL CULTURE

## 2014-11-11 ENCOUNTER — Ambulatory Visit (INDEPENDENT_AMBULATORY_CARE_PROVIDER_SITE_OTHER): Payer: Medicare HMO | Admitting: Pulmonary Disease

## 2014-11-11 ENCOUNTER — Encounter: Payer: Self-pay | Admitting: Pulmonary Disease

## 2014-11-11 ENCOUNTER — Ambulatory Visit: Payer: Medicare HMO | Admitting: Pulmonary Disease

## 2014-11-11 VITALS — BP 144/86 | HR 103 | Ht 65.25 in | Wt 234.0 lb

## 2014-11-11 DIAGNOSIS — J4530 Mild persistent asthma, uncomplicated: Secondary | ICD-10-CM | POA: Diagnosis not present

## 2014-11-11 NOTE — Assessment & Plan Note (Signed)
Based on her moderate airflow obstruction she has moderate chronic obstructive asthma. She has never received much benefit from bronchodilators because I think a significant portion of her dyspnea is related to obesity and deconditioning. This is been a stable interval for her in the sense that she has not had an exacerbation of her asthma.\  Compliance with medications continues to be a problem, primarily because of cost.  Her flu shot is up-to-date  Plan: Continue attempts at weight loss with regular exercise I encouraged her to use bronchodilators with an inhaled steroid to help with dyspnea, I gave her a sample of Symbicort and am happy to change her prescription to that effect helpful Follow-up one year

## 2014-11-11 NOTE — Progress Notes (Signed)
Subjective:    Patient ID: Kathryn Olsen, female    DOB: June 24, 1950, 64 y.o.   MRN: [id number]  Synopsis: Refer to [facility] in 2015 for evaluation of shortness of breath. She is a lifetime nonsmoker and is morbidly obese. She had an echocardiogram which showed a normal LV size and function and an RVSP of 35 mm of mercury 11/29/2013 full pulmonary function testing> ratio 63% with FEV1 of 1.39 L (55% predicted) pre-bronchodilator, postbronchodilator ratio 73% FEV1 2.03 L (80% predicted, 46% change) DLCO of 99% predicted  HPI  Chief Complaint  Patient presents with  . Follow-up    pt c/o stable DOE, nonprod cough.  no other complaints.     She is headed to [place] for 2 months to visit friends. She has been OK, she says that she gets dyspneic when she lifts things or walks too far, if she gets dyspneic. She has not used Dulera because it is too expensive. She only uses the albuterol 2-3 times per month, not more frequent than that. She has a cough, sometimes produces mucus, never much color too it.   She has a history of recurrent upper airway and lower respiratory illnesses, but not recently. She had a flu shot two days ago.    Past Medical History  Diagnosis Date  . Degeneration of intervertebral disc, site unspecified   . Dyslipidemia   . DJD (degenerative joint disease)   . Asthma   . COPD (chronic obstructive pulmonary disease)     questionable  . Depression   . Sleep apnea     stopbang=4  . Unspecified essential hypertension   . GERD (gastroesophageal reflux disease)   . Pneumonia 2/15  . Shortness of breath     on exertion     Review of Systems  Constitutional: Positive for fatigue. Negative for fever and chills.  HENT: Negative for postnasal drip, rhinorrhea and sinus pressure.   Respiratory: Positive for shortness of breath. Negative for cough and wheezing.   Cardiovascular: Negative for chest pain, palpitations and leg swelling.         Objective:   Physical Exam Filed Vitals:   11/11/14 1455  BP: 144/86  Pulse: 103  Height: 5' 5.25" (1.657 m)  Weight: 234 lb (106.142 kg)  SpO2: 97%    Gen: well appearing, no acute distress HEENT: NCAT, EOMi, OP clear,  PULM: CTA B CV: RRR, no mgr, no JVD AB: BS+, soft, nontender, Ext: warm, no edema, no clubbing, no cyanosis Derm: no rash or skin breakdown Neuro: A&Ox4, MAEW      Assessment & Plan:   Asthma, chronic Based on her moderate airflow obstruction she has moderate chronic obstructive asthma. She has never received much benefit from bronchodilators because I think a significant portion of her dyspnea is related to obesity and deconditioning. This is been a stable interval for her in the sense that she has not had an exacerbation of her asthma.\  Compliance with medications continues to be a problem, primarily because of cost.  Her flu shot is up-to-date  Plan: Continue attempts at weight loss with regular exercise I encouraged her to use bronchodilators with an inhaled steroid to help with dyspnea, I gave her a sample of Symbicort and am happy to change her prescription to that effect helpful Follow-up one year    Updated Medication List Outpatient Encounter Prescriptions as of 11/11/2014  Medication Sig  . albuterol (PROVENTIL HFA;VENTOLIN HFA) 108 (90 BASE) MCG/ACT inhaler Inhale 1-2 puffs  into the lungs every 6 (six) hours as needed for wheezing or shortness of breath.  [name] Kitchen amLODipine (NORVASC) 5 MG tablet Take 1 tablet (5 mg total) by mouth daily. NEEDS APPOINTMENT FOR FUTURE REFILLS  . aspirin 81 MG tablet Take 81 mg by mouth daily.  . calcium-vitamin D 250-100 MG-UNIT per tablet Take 1 tablet by mouth daily.  . cloNIDine (CATAPRES) 0.2 MG tablet Take 0.2 mg by mouth at bedtime.   . cyclobenzaprine (FLEXERIL) 10 MG tablet Take 5 mg by mouth 3 (three) times daily as needed for muscle spasms.  . diphenoxylate-atropine (LOMOTIL) 2.5-0.025 MG per tablet Take  2 tablets by mouth 4 (four) times daily as needed for diarrhea or loose stools.  . meloxicam (MOBIC) 15 MG tablet Take 15 mg by mouth daily.   Maxwell Caul Bicarbonate (ZEGERID OTC PO) Take 1 capsule by mouth every evening.  [name] Kitchen Spacer/Aero-Holding Chambers (AEROCHAMBER Z-STAT PLUS) inhaler Use as instructed  . traMADol (ULTRAM) 50 MG tablet Take 50 mg by mouth every 6 (six) hours as needed.  . ciprofloxacin (CIPRO) 500 MG tablet Take 1 tablet (500 mg total) by mouth 2 (two) times daily. (Patient not taking: Reported on 11/11/2014)  . [DISCONTINUED] ferrous sulfate 325 (65 FE) MG tablet Take 1 tablet (325 mg total) by mouth 3 (three) times daily after meals. (Patient not taking: Reported on 10/23/2014)  . [DISCONTINUED] HYDROcodone-acetaminophen (NORCO) 7.5-325 MG per tablet Take 1-2 tablets by mouth every 4 (four) hours as needed for moderate pain. (Patient not taking: Reported on 10/23/2014)  . [DISCONTINUED] meclizine (ANTIVERT) 25 MG tablet Take 1 tablet (25 mg total) by mouth 3 (three) times daily as needed for dizziness. (Patient not taking: Reported on 11/11/2014)  . [DISCONTINUED] metroNIDAZOLE (FLAGYL) 500 MG tablet Take 1 tablet (500 mg total) by mouth 3 (three) times daily. (Patient not taking: Reported on 11/11/2014)  . [DISCONTINUED] mometasone-formoterol (DULERA) 100-5 MCG/ACT AERO Inhale 2 puffs into the lungs 2 (two) times daily. (Patient not taking: Reported on 11/11/2014)  . [DISCONTINUED] olmesartan-hydrochlorothiazide (BENICAR HCT) 40-25 MG per tablet Take 1 tablet by mouth daily. NEEDS APPOINTMENT FOR FUTURE REFILLS (Patient not taking: Reported on 11/11/2014)   No facility-administered encounter medications on file as of 11/11/2014.

## 2014-11-11 NOTE — Patient Instructions (Signed)
I would like for you to exercise regularly Use Symbicort 2 puffs twice a day no matter how you feel to help with the shortness of breath, I'm happy to change her prescription to this if you find that it's helpful We will see you back in one year or sooner if needed

## 2014-11-26 ENCOUNTER — Ambulatory Visit: Payer: Medicare HMO | Admitting: Pulmonary Disease

## 2015-08-03 ENCOUNTER — Encounter: Payer: Self-pay | Admitting: Adult Health

## 2015-08-03 ENCOUNTER — Ambulatory Visit (INDEPENDENT_AMBULATORY_CARE_PROVIDER_SITE_OTHER): Payer: Medicare HMO | Admitting: Adult Health

## 2015-08-03 VITALS — BP 134/86 | HR 89 | Temp 98.8°F | Ht 67.0 in | Wt 235.0 lb

## 2015-08-03 DIAGNOSIS — J4541 Moderate persistent asthma with (acute) exacerbation: Secondary | ICD-10-CM

## 2015-08-03 MED ORDER — LEVALBUTEROL HCL 0.63 MG/3ML IN NEBU
0.6300 mg | INHALATION_SOLUTION | Freq: Once | RESPIRATORY_TRACT | Status: AC
  Administered 2015-08-03: 0.63 mg via RESPIRATORY_TRACT

## 2015-08-03 MED ORDER — PREDNISONE 10 MG PO TABS: ORAL_TABLET | ORAL | Status: DC

## 2015-08-03 MED ORDER — AZITHROMYCIN 250 MG PO TABS: ORAL_TABLET | ORAL | Status: AC

## 2015-08-03 NOTE — Patient Instructions (Addendum)
Zpack take as directed.  Mucinex DM Twice daily  As needed  Cough/congestion  Prednisone taper over next week.  Begin Symbicort 80/4.45mcg 2 puffs Twice daily  , rinse after use.  Use Ventolin 2 puffs every 4h as needed for wheezing .  Please contact office for sooner follow up if symptoms do not improve or worsen or seek emergency care  Follow up Dr. Kendrick FriesMcQuaid and As needed

## 2015-08-03 NOTE — Addendum Note (Signed)
Addended by: Karalee HeightOX, Neidy Guerrieri P on: 08/03/2015 11:07 AM   Modules accepted: Orders

## 2015-08-03 NOTE — Progress Notes (Signed)
Subjective:    Patient ID: Kathryn Olsen, female    DOB: 10-31-1950, 65 y.o.   MRN: [id number]  HPI 65 yo never smoker followed for chronic asthma   TEST  echocardiogram which showed a normal LV size and function and an RVSP of 35 mm of mercury 11/29/2013 full pulmonary function testing> ratio 63% with FEV1 of 1.39 L (55% predicted) pre-bronchodilator, postbronchodilator ratio 73% FEV1 2.03 L (80% predicted, 46% change) DLCO of 99% predicted   08/03/2015 Acute OV  Pt presents for an acute office visit.  Complains of prod cough with yellow mucus, sinus drainage, chest congestion/tightness, SOB and wheezing x 3 days. Took some sudafed to help with congestioin/pressure. Has a lot of drainage in back of throat.  Says she does not take inhaler on regular basis . Was given Symbicort at last ov . Uses rescue inhaler rarely. Her Ventolin inhaler is expired from 2014. New rx sent to pharm.   Says she does have some daily sx but does not consider them bad.   Denies any chills, bodyaches, fever, nausea or vomiting.    Past Medical History  Diagnosis Date  . Degeneration of intervertebral disc, site unspecified   . Dyslipidemia   . DJD (degenerative joint disease)   . Asthma   . COPD (chronic obstructive pulmonary disease) (HCC)     questionable  . Depression   . Sleep apnea     stopbang=4  . Unspecified essential hypertension   . GERD (gastroesophageal reflux disease)   . Pneumonia 2/15  . Shortness of breath     on exertion   Current Outpatient Prescriptions on File Prior to Visit  Medication Sig Dispense Refill  . albuterol (PROVENTIL HFA;VENTOLIN HFA) 108 (90 BASE) MCG/ACT inhaler Inhale 1-2 puffs into the lungs every 6 (six) hours as needed for wheezing or shortness of breath. 1 Inhaler 6  . amLODipine (NORVASC) 5 MG tablet Take 1 tablet (5 mg total) by mouth daily. NEEDS APPOINTMENT FOR FUTURE REFILLS 30 tablet 0  . aspirin 81 MG tablet Take 81 mg by mouth daily.    .  calcium-vitamin D 250-100 MG-UNIT per tablet Take 1 tablet by mouth daily.    . cloNIDine (CATAPRES) 0.2 MG tablet Take 0.2 mg by mouth at bedtime.     . cyclobenzaprine (FLEXERIL) 10 MG tablet Take 5 mg by mouth 3 (three) times daily as needed for muscle spasms.    . meloxicam (MOBIC) 15 MG tablet Take 15 mg by mouth daily.     Maxwell Caul Bicarbonate (ZEGERID OTC PO) Take 1 capsule by mouth every evening.    [name] Kitchen Spacer/Aero-Holding Chambers (AEROCHAMBER Z-STAT PLUS) inhaler Use as instructed 1 each 0  . traMADol (ULTRAM) 50 MG tablet Take 50 mg by mouth every 6 (six) hours as needed.    . diphenoxylate-atropine (LOMOTIL) 2.5-0.025 MG per tablet Take 2 tablets by mouth 4 (four) times daily as needed for diarrhea or loose stools. (Patient not taking: Reported on 08/03/2015) 30 tablet 0   No current facility-administered medications on file prior to visit.       Review of Systems Constitutional:   No  weight loss, night sweats,  Fevers, chills, fatigue, or  lassitude.  HEENT:   No headaches,  Difficulty swallowing,  Tooth/dental problems, or  Sore throat,                No sneezing, itching, ear ache,  +nasal congestion, post nasal drip,   CV:  No chest  pain,  Orthopnea, PND, swelling in lower extremities, anasarca, dizziness, palpitations, syncope.   GI  No heartburn, indigestion, abdominal pain, nausea, vomiting, diarrhea, change in bowel habits, loss of appetite, bloody stools.   Resp:   No chest wall deformity  Skin: no rash or lesions.  GU: no dysuria, change in color of urine, no urgency or frequency.  No flank pain, no hematuria   MS:  No joint pain or swelling.  No decreased range of motion.  No back pain.  Psych:  No change in mood or affect. No depression or anxiety.  No memory loss.         Objective:   Physical Exam Filed Vitals:   08/03/15 1043  BP: 134/86  Pulse: 89  Temp: 98.8 F (37.1 C)  TempSrc: Oral  Height: 5\' 7"  (1.702 m)  Weight: 235 lb  (106.595 kg)  SpO2: 97%   GEN: A/Ox3; pleasant , NAD, elderly   HEENT:  [place]/AT,  EACs-clear, TMs-wnl, NOSE-clear, THROAT-clear, no lesions, no postnasal drip or exudate noted.   NECK:  Supple w/ fair ROM; no JVD; normal carotid impulses w/o bruits; no thyromegaly or nodules palpated; no lymphadenopathy.no stridor.   RESP  Few trace wheezing , + cough , speaking in full sentences .no accessory muscle use, no dullness to percussion  CARD:  RRR, no m/r/g  , no peripheral edema, pulses intact, no cyanosis or clubbing.  GI:   Soft & nt; nml bowel sounds; no organomegaly or masses detected.  Musco: Warm bil, no deformities or joint swelling noted.   Neuro: alert, no focal deficits noted.    Skin: Warm, no lesions or rashes  Rozlyn Yerby NP-C  [facility]  08/03/2015        Assessment & Plan:

## 2015-08-03 NOTE — Assessment & Plan Note (Signed)
Flare with URI /bronchitis  xopenex neb x 1   Plan  Zpack take as directed.  Mucinex DM Twice daily  As needed  Cough/congestion  Prednisone taper over next week.  Begin Symbicort 80/4.565mcg 2 puffs Twice daily  , rinse after use.  Use Ventolin 2 puffs every 4h as needed for wheezing .  Please contact office for sooner follow up if symptoms do not improve or worsen or seek emergency care  Follow up Dr. Kendrick FriesMcQuaid and As needed

## 2015-08-04 NOTE — Progress Notes (Signed)
Chart reviewed, I agree with this plan of care 

## 2015-11-05 ENCOUNTER — Ambulatory Visit (INDEPENDENT_AMBULATORY_CARE_PROVIDER_SITE_OTHER): Payer: Medicare HMO | Admitting: Pulmonary Disease

## 2015-11-05 ENCOUNTER — Encounter: Payer: Self-pay | Admitting: Pulmonary Disease

## 2015-11-05 DIAGNOSIS — J4541 Moderate persistent asthma with (acute) exacerbation: Secondary | ICD-10-CM

## 2015-11-05 MED ORDER — BUDESONIDE-FORMOTEROL FUMARATE 80-4.5 MCG/ACT IN AERO: 2.0000 | INHALATION_SPRAY | Freq: Two times a day (BID) | RESPIRATORY_TRACT | 12 refills | Status: DC

## 2015-11-05 MED ORDER — ALBUTEROL SULFATE HFA 108 (90 BASE) MCG/ACT IN AERS: 1.0000 | INHALATION_SPRAY | Freq: Four times a day (QID) | RESPIRATORY_TRACT | 6 refills | Status: DC | PRN

## 2015-11-05 MED ORDER — BUDESONIDE-FORMOTEROL FUMARATE 80-4.5 MCG/ACT IN AERO: 2.0000 | INHALATION_SPRAY | Freq: Two times a day (BID) | RESPIRATORY_TRACT | 0 refills | Status: DC

## 2015-11-05 NOTE — Patient Instructions (Addendum)
It is nice to meet you today. Continue your Symbicort 2 puffs twice daily. Rinse mouth after use. Continue to use your Ventolin for shortness of breath or wheezing up to every 6 hours as needed. We will prescribe you a new Ventolin and a new Symbicort. We will check for samples of Symbicort. Remember to get your flu shot in the fall. Follow up in 6 months with Dr. Kendrick FriesMcQuaid Please contact office for sooner follow up if symptoms do not improve or worsen or seek emergency care

## 2015-11-05 NOTE — Assessment & Plan Note (Signed)
  Chronic Asthma Using Symbicort 1 puff once daily only Rescue inhaler use 2 times a week. Plan: Continue your Symbicort  But take 2 puffs twice daily. Rinse mouth after use. Continue to use your Ventolin for shortness of breath or wheezing up to every 6 hours as needed. We will prescribe you a new Ventolin and a new Symbicort. We will check for samples of Symbicort. Add Claritin 10 mg daily for seasonal allergies. Remember to get your flu shot in the fall. Follow up in 6 months with Dr. Kendrick FriesMcQuaid Please contact office for sooner follow up if symptoms do not improve or worsen or seek emergency care

## 2015-11-05 NOTE — Progress Notes (Signed)
History of Present Illness Kathryn Olsen is a 65 y.o. 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 Kendrick FriesMcQuaid.   11/05/2015 Follow Up OV for Asthma: Pt. Returns to the office for follow up of her asthma exacerbation in May, 2017.[name] Kitchen. She was given a Symbicort Sample in May, and ran out. She did find another sample at home and has been using that. She states she  is using the Symbicort every day. She is using her rescue inhaler every few days as needed for shortness of breath and wheezing. No recent upper respiratory infections. She does have occasional dyspnea which she finds is exertional.She denies fever, chest pain, her breathing never wakes her up at night. She denies hemoptysis. No productive cough. But positive non-productive cough. She states that she is sedentary and paints a lot. She is getting ready to travel to [place] in October for 2 months. She does take Zegerid for reflux. We did discuss adding a non-sedating anti histamine for seasonal allergies and post nasal drip. We did discuss taking maintenance medications daily as prescribed.  Tests 11/29/2013 full pulmonary function testing> ratio 63% with FEV1 of 1.39 L (55% predicted) pre-bronchodilator, postbronchodilator ratio 73% FEV1 2.03 L (80% predicted, 46% change) DLCO of 99% predicted   Past medical hx Past Medical History:  Diagnosis Date  . Asthma   . COPD (chronic obstructive pulmonary disease) (HCC)    questionable  . Degeneration of intervertebral disc, site unspecified   . Depression   . DJD (degenerative joint disease)   . Dyslipidemia   . GERD (gastroesophageal reflux disease)   . Pneumonia 2/15  . Shortness of breath    on exertion  . Sleep apnea    stopbang=4  . Unspecified essential hypertension      Past surgical hx, Family hx, Social hx all reviewed.  Current Outpatient Prescriptions on File Prior to Visit  Medication Sig  . albuterol (PROVENTIL HFA;VENTOLIN HFA) 108 (90  BASE) MCG/ACT inhaler Inhale 1-2 puffs into the lungs every 6 (six) hours as needed for wheezing or shortness of breath.  [name] Kitchen. amLODipine (NORVASC) 5 MG tablet Take 1 tablet (5 mg total) by mouth daily. NEEDS APPOINTMENT FOR FUTURE REFILLS  . aspirin 81 MG tablet Take 81 mg by mouth daily.  . calcium-vitamin D 250-100 MG-UNIT per tablet Take 1 tablet by mouth daily.  . cloNIDine (CATAPRES) 0.2 MG tablet Take 0.2 mg by mouth at bedtime.   . cyclobenzaprine (FLEXERIL) 10 MG tablet Take 5 mg by mouth 3 (three) times daily as needed for muscle spasms.  . diphenoxylate-atropine (LOMOTIL) 2.5-0.025 MG per tablet Take 2 tablets by mouth 4 (four) times daily as needed for diarrhea or loose stools.  . meloxicam (MOBIC) 15 MG tablet Take 15 mg by mouth daily.   [name] Omeprazole-Sodium Bicarbonate (ZEGERID OTC PO) Take 1 capsule by mouth every evening.  . Probiotic Product (PROBIOTIC DAILY PO) Take 1 tablet by mouth daily.  [name] Kitchen. Spacer/Aero-Holding Chambers (AEROCHAMBER Z-STAT PLUS) inhaler Use as instructed  . traMADol (ULTRAM) 50 MG tablet Take 50 mg by mouth every 6 (six) hours as needed.   No current facility-administered medications on file prior to visit.      Allergies  Allergen Reactions  . Morphine And Related Anaphylaxis and Itching  . Ace Inhibitors Other (See Comments)    Review Of Systems:  Constitutional:   No  weight loss, night sweats,  Fevers, chills, fatigue, or  lassitude.  HEENT:   No  headaches,  Difficulty swallowing,  Tooth/dental problems, or  Sore throat,                No sneezing, itching, ear ache, nasal congestion, post nasal drip,   CV:  No chest pain,  Orthopnea, PND, swelling in lower extremities, anasarca, dizziness, palpitations, syncope.   GI  No heartburn, indigestion, abdominal pain, nausea, vomiting, diarrhea, change in bowel habits, loss of appetite, bloody stools.   Resp: Occasional  shortness of breath with exertion or at rest.  No excess mucus, no productive  cough,  No non-productive cough,  No coughing up of blood.  No change in color of mucus.  No wheezing.  No chest wall deformity  Skin: no rash or lesions.  GU: no dysuria, change in color of urine, no urgency or frequency.  No flank pain, no hematuria   MS:  No joint pain or swelling.  No decreased range of motion.  No back pain.  Psych:  No change in mood or affect. No depression or anxiety.  No memory loss.   Vital Signs BP 130/86 (BP Location: Left Arm, Cuff Size: Normal)   Pulse 90   Ht 5\' 6"  (1.676 m)   Wt 239 lb (108.4 kg)   SpO2 95%   BMI 38.58 kg/m    Physical Exam:  General- No distress,  A&Ox3, pleasant ENT: No sinus tenderness, TM clear, pale nasal mucosa, no oral exudate,no post nasal drip, no LAN Cardiac: S1, S2, regular rate and rhythm, no murmur Chest: No wheeze/ rales/ dullness; no accessory muscle use, no nasal flaring, no sternal retractions Abd.: Soft Non-tender Ext: No clubbing cyanosis, edema Neuro:  normal strength Skin: No rashes, warm and dry Psych: normal mood and behavior   Assessment/Plan  Asthma, chronic  Chronic Asthma Using Symbicort 1 puff once daily only Rescue inhaler use 2 times a week. Plan: Continue your Symbicort  But take 2 puffs twice daily. Rinse mouth after use. Continue to use your Ventolin for shortness of breath or wheezing up to every 6 hours as needed. We will prescribe you a new Ventolin and a new Symbicort. We will check for samples of Symbicort. Add Claritin 10 mg daily for seasonal allergies. Remember to get your flu shot in the fall. Follow up in 6 months with Dr. Kendrick Fries Please contact office for sooner follow up if symptoms do not improve or worsen or seek emergency care      Bevelyn Ngo, NP 11/05/2015  2:35 PM

## 2015-11-05 NOTE — Progress Notes (Signed)
[facility]   I has seen and examined the patient with Kandice RobinsonsSarah Groce and I agree with the findings and her note  She had a flareup back in May, was given Symbicort which it sounds like she's taken on an as-needed basis or only 1 puff daily, reports using albuterol up to 4-5 times a week  On exam: Lungs are clear, normal effort Cardiovascular regular rate and rhythm no murmurs gallops or rubs  Asthma: Based on the recent flareup and albuterol use this will be considered poorly controlled disease, though I wonder if her dyspnea is more just obesity and deconditioning related. Dhe is not taking her controller medications appropriately we've given her instructions a on appropriate use of Symbicort (2 puffs twice a day). If that controls her symptoms and she no longer needs albuterol frequently and I think that speaks to the fact that her asthma was truly poorly controlled. If however she still needs to use albuterol despite that dose she may need further testing such as lung function testing or neck still nitric oxide testing to try to see if her asthma is that bad. That said, she looks great on exam her lungs are totally clear so I think most of her dyspnea is coming from obesity and deconditioning.  If that is the case then she could probably go to just using an inhaled steroid daily and as needed albuterol.  > 50% of today's visit spent face to face, 28 minute visit  Heber CarolinaBrent Cambree Hendrix, MD [facility] Pager: [phone] Cell: [phone] After 3pm or if no response, call [phone]

## 2015-11-05 NOTE — Addendum Note (Signed)
Addended by: Velvet BatheAULFIELD, ASHLEY L on: 11/05/2015 02:56 PM   Modules accepted: Orders

## 2015-12-01 DIAGNOSIS — Z9181 History of falling: Secondary | ICD-10-CM | POA: Insufficient documentation

## 2016-07-13 DIAGNOSIS — Z5320 Procedure and treatment not carried out because of patient's decision for unspecified reasons: Secondary | ICD-10-CM | POA: Insufficient documentation

## 2017-11-02 DIAGNOSIS — G8929 Other chronic pain: Secondary | ICD-10-CM | POA: Insufficient documentation

## 2017-11-03 ENCOUNTER — Ambulatory Visit (HOSPITAL_COMMUNITY)
Admission: RE | Admit: 2017-11-03 | Discharge: 2017-11-03 | Disposition: A | Discharge status: Home / Self Care | Payer: Medicare HMO | Source: Ambulatory Visit | Attending: Family Medicine | Admitting: Family Medicine

## 2017-11-03 ENCOUNTER — Other Ambulatory Visit (HOSPITAL_COMMUNITY): Payer: Self-pay | Admitting: Family Medicine

## 2017-11-03 DIAGNOSIS — X58XXXA Exposure to other specified factors, initial encounter: Secondary | ICD-10-CM | POA: Insufficient documentation

## 2017-11-03 DIAGNOSIS — M24852 Other specific joint derangements of left hip, not elsewhere classified: Secondary | ICD-10-CM | POA: Insufficient documentation

## 2017-11-03 DIAGNOSIS — R52 Pain, unspecified: Secondary | ICD-10-CM

## 2017-11-03 DIAGNOSIS — T1490XA Injury, unspecified, initial encounter: Secondary | ICD-10-CM

## 2017-11-03 DIAGNOSIS — M47896 Other spondylosis, lumbar region: Secondary | ICD-10-CM | POA: Insufficient documentation

## 2017-11-03 DIAGNOSIS — M25551 Pain in right hip: Secondary | ICD-10-CM

## 2017-11-03 DIAGNOSIS — M24851 Other specific joint derangements of right hip, not elsewhere classified: Secondary | ICD-10-CM | POA: Diagnosis not present

## 2017-11-03 DIAGNOSIS — S92912A Unspecified fracture of left toe(s), initial encounter for closed fracture: Secondary | ICD-10-CM | POA: Diagnosis not present

## 2018-01-03 ENCOUNTER — Other Ambulatory Visit: Payer: Self-pay

## 2018-01-03 ENCOUNTER — Emergency Department (HOSPITAL_COMMUNITY): Payer: Medicare HMO

## 2018-01-03 ENCOUNTER — Encounter (HOSPITAL_COMMUNITY): Payer: Self-pay | Admitting: Emergency Medicine

## 2018-01-03 ENCOUNTER — Emergency Department (HOSPITAL_COMMUNITY)
Admission: EM | Admit: 2018-01-03 | Discharge: 2018-01-03 | Disposition: A | Discharge status: Home / Self Care | Payer: Medicare HMO | Attending: Emergency Medicine | Admitting: Emergency Medicine

## 2018-01-03 ENCOUNTER — Emergency Department: Payer: Self-pay

## 2018-01-03 DIAGNOSIS — Y999 Unspecified external cause status: Secondary | ICD-10-CM | POA: Insufficient documentation

## 2018-01-03 DIAGNOSIS — Z7982 Long term (current) use of aspirin: Secondary | ICD-10-CM | POA: Diagnosis not present

## 2018-01-03 DIAGNOSIS — W19XXXA Unspecified fall, initial encounter: Secondary | ICD-10-CM | POA: Insufficient documentation

## 2018-01-03 DIAGNOSIS — Y939 Activity, unspecified: Secondary | ICD-10-CM | POA: Diagnosis not present

## 2018-01-03 DIAGNOSIS — I10 Essential (primary) hypertension: Secondary | ICD-10-CM | POA: Insufficient documentation

## 2018-01-03 DIAGNOSIS — Y929 Unspecified place or not applicable: Secondary | ICD-10-CM | POA: Insufficient documentation

## 2018-01-03 DIAGNOSIS — Z79899 Other long term (current) drug therapy: Secondary | ICD-10-CM | POA: Diagnosis not present

## 2018-01-03 DIAGNOSIS — S7001XA Contusion of right hip, initial encounter: Secondary | ICD-10-CM | POA: Diagnosis not present

## 2018-01-03 DIAGNOSIS — Z7722 Contact with and (suspected) exposure to environmental tobacco smoke (acute) (chronic): Secondary | ICD-10-CM | POA: Insufficient documentation

## 2018-01-03 DIAGNOSIS — J449 Chronic obstructive pulmonary disease, unspecified: Secondary | ICD-10-CM | POA: Insufficient documentation

## 2018-01-03 DIAGNOSIS — J45909 Unspecified asthma, uncomplicated: Secondary | ICD-10-CM | POA: Insufficient documentation

## 2018-01-03 DIAGNOSIS — S79911A Unspecified injury of right hip, initial encounter: Secondary | ICD-10-CM | POA: Diagnosis present

## 2018-01-03 MED ORDER — PREDNISONE 10 MG PO TABS: 20.0000 mg | ORAL_TABLET | Freq: Every day | ORAL | 0 refills | Status: DC

## 2018-01-03 MED ORDER — HYDROCODONE-ACETAMINOPHEN 5-325 MG PO TABS: 1.0000 | ORAL_TABLET | ORAL | 0 refills | Status: DC | PRN

## 2018-01-03 MED ORDER — DEXAMETHASONE SODIUM PHOSPHATE 10 MG/ML IJ SOLN
10.0000 mg | Freq: Once | INTRAMUSCULAR | Status: AC
  Administered 2018-01-03: 10 mg via INTRAMUSCULAR
  Filled 2018-01-03: qty 1

## 2018-01-03 MED ORDER — KETOROLAC TROMETHAMINE 30 MG/ML IJ SOLN
30.0000 mg | Freq: Once | INTRAMUSCULAR | Status: AC
  Administered 2018-01-03: 30 mg via INTRAMUSCULAR
  Filled 2018-01-03: qty 1

## 2018-01-03 NOTE — ED Triage Notes (Signed)
Pt fell last Thursday. Pt c/o chronic arthritis. Pt c/o severe pain to right hip. Takes tramadol and usually takes 1 but has been taking 3 with little help. Pt has mild limping to triage.

## 2018-01-03 NOTE — ED Notes (Signed)
Pt is having lower right lumbar pain

## 2018-01-03 NOTE — ED Notes (Signed)
Pt signed, but it did not capture. Verbalized understanding

## 2018-01-03 NOTE — ED Provider Notes (Signed)
[facility] EMERGENCY DEPARTMENT Provider Note   CSN: [id number] Arrival date & time: 01/03/18  1350     History   Chief Complaint Chief Complaint  Patient presents with  . Fall    10/17    HPI Kathryn Olsen is a 67 y.o. female.  Pt presents to the ED today with right hip pain.  Pt said she fell on 10/17 and landed on her bottom.  She normally has pain in her joints from arthritis and takes 1 tramadol at night.  Since falling, she has had to take 2-3 and still hurts a lot.  She said she's not been able to sleep because she can't get comfortable.  She denies any other injuries.     Past Medical History:  Diagnosis Date  . Asthma   . COPD (chronic obstructive pulmonary disease) (HCC)    questionable  . Degeneration of intervertebral disc, site unspecified   . Depression   . DJD (degenerative joint disease)   . Dyslipidemia   . GERD (gastroesophageal reflux disease)   . Pneumonia 2/15  . Shortness of breath    on exertion  . Sleep apnea    stopbang=4  . Unspecified essential hypertension     Patient Active Problem List   Diagnosis Date Noted  . Asthma, chronic 11/29/2013  . Other malaise and fatigue 11/29/2013  . Shortness of breath 10/28/2013  . Obesity, unspecified 10/28/2013  . S/P left knee revision 09/23/2013  . DOE (dyspnea on exertion) 08/09/2013  . Preoperative cardiovascular examination 08/09/2013  . Pre-operative cardiovascular examination 08/09/2013  . Chest pain 08/09/2013  . Dyspnea 08/09/2013  . Uncontrolled hypertension 08/09/2013  . Nonspecific (abnormal) findings on radiological and other examination of gastrointestinal tract 04/26/2011  . Esophageal reflux 04/26/2011  . Chest pain, unspecified 04/26/2011  . Special screening for malignant neoplasms, colon 04/26/2011  . DYSLIPIDEMIA 09/03/2008  . Malignant hypertension 09/03/2008  . DEGENERATIVE DISC DISEASE 09/03/2008    Past Surgical History:  Procedure Laterality Date  .  APPENDECTOMY    . ECTOPIC PREGNANCY SURGERY    . KNEE ARTHROSCOPY  2010    Right knee  . KNEE ARTHROSCOPY  2012   Left knee  . 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 TOTAL KNEE REVISION Left 09/23/2013   Procedure: REVISION LEFT TOTAL KNEE WITH POLY EXCHANGE UPSIZE AND PATELLA REVISION;  Surgeon: Shelda Pal, MD;  Location: [facility];  Service: Orthopedics;  Laterality: Left;     OB History   None      Home Medications    Prior to Admission medications   Medication Sig Start Date End Date Taking? Authorizing Provider  albuterol (PROVENTIL HFA;VENTOLIN HFA) 108 (90 Base) MCG/ACT inhaler Inhale 1-2 puffs into the lungs every 6 (six) hours as needed for wheezing or shortness of breath. 11/05/15   Lupita Leash, MD  amLODipine (NORVASC) 5 MG tablet Take 1 tablet (5 mg total) by mouth daily. NEEDS APPOINTMENT FOR FUTURE REFILLS 10/31/14   Chrystie Nose, MD  aspirin 81 MG tablet Take 81 mg by mouth daily.    [provider]  budesonide-formoterol (SYMBICORT) 80-4.5 MCG/ACT inhaler Inhale 2 puffs into the lungs 2 (two) times daily. 11/05/15   Lupita Leash, MD  calcium-vitamin D 250-100 MG-UNIT per tablet Take 1 tablet by mouth daily.    [provider]  cloNIDine (CATAPRES)  0.2 MG tablet Take 0.2 mg by mouth at bedtime.     [provider]  diphenoxylate-atropine (LOMOTIL) 2.5-0.025 MG per tablet Take 2 tablets by mouth 4 (four) times daily as needed for diarrhea or loose stools. 10/24/14   Elpidio Anis, PA-C  HYDROcodone-acetaminophen (NORCO/VICODIN) 5-325 MG tablet Take 1 tablet by mouth every 4 (four) hours as needed. 01/03/18   Jacalyn Lefevre, MD  meloxicam (MOBIC) 15 MG tablet Take 15 mg by mouth daily.     [provider]  Omeprazole-Sodium Bicarbonate (ZEGERID OTC PO) Take 1 capsule by mouth every evening.     [provider]  predniSONE (DELTASONE) 10 MG tablet Take 2 tablets (20 mg total) by mouth daily. 01/03/18   Jacalyn Lefevre, MD  Probiotic Product (PROBIOTIC DAILY PO) Take 1 tablet by mouth daily.    [provider]  Spacer/Aero-Holding Chambers (AEROCHAMBER Z-STAT PLUS) inhaler Use as instructed 11/29/13   Lupita Leash, MD  traMADol (ULTRAM) 50 MG tablet Take 50 mg by mouth every 6 (six) hours as needed.    [provider]    Family History Family History  Problem Relation Age of Onset  . Lung cancer Father   . COPD Mother   . Hypertension Mother   . Alcohol abuse Brother   . Melanoma Brother     Social History Social History   Tobacco Use  . Smoking status: Passive Smoke Exposure - Never Smoker  . Smokeless tobacco: Never Used  . Tobacco comment: both parents smoked  Substance Use Topics  . Alcohol use: Yes    Alcohol/week: 1.0 standard drinks    Types: 1 Glasses of wine per week    Comment: occasional glass of wine  . Drug use: No     Allergies   Buprenorphine hcl; Morphine and related; and Ace inhibitors   Review of Systems Review of Systems  Musculoskeletal:       Right hip  All other systems reviewed and are negative.    Physical Exam Updated Vital Signs BP (!) 185/115 (BP Location: Right Arm)   Pulse 97   Temp 98.2 F (36.8 C) (Oral)   Resp 18   Ht 5\' 6"  (1.676 m)   Wt 102.1 kg   SpO2 98%   BMI 36.32 kg/m   Physical Exam  Constitutional: She is oriented to person, place, and time. She appears well-developed and well-nourished.  HENT:  Head: Normocephalic and atraumatic.  Right Ear: External ear normal.  Left Ear: External ear normal.  Nose: Nose normal.  Mouth/Throat: Oropharynx is clear and moist.  Eyes: Pupils are equal, round, and reactive to light. Conjunctivae and EOM are normal.  Neck: Normal range of motion. Neck supple.  Cardiovascular: Normal rate, regular rhythm, normal heart sounds and intact  distal pulses.  Pulmonary/Chest: Effort normal and breath sounds normal.  Abdominal: Soft. Bowel sounds are normal.  Musculoskeletal:       Legs: Neurological: She is alert and oriented to person, place, and time.  Skin: Skin is warm and dry. Capillary refill takes less than 2 seconds.  Psychiatric: She has a normal mood and affect. Her behavior is normal. Judgment and thought content normal.  Nursing note and vitals reviewed.    ED Treatments / Results  Labs (all labs ordered are listed, but only abnormal results are displayed) Labs Reviewed - No data to display  EKG None  Radiology Dg Lumbar Spine Complete  Result Date: 01/03/2018 CLINICAL DATA:  Right hip and low back  pain after trip and fall 12/28/2017. EXAM: LUMBAR SPINE - COMPLETE 4+ VIEW COMPARISON:  CT 10/25/2014 FINDINGS: Exam demonstrates mild spondylosis throughout the lumbar spine to include facet arthropathy. There is spondylosis of the lower thoracic spine. Mild anterior wedge compression deformity of T11 unchanged. Subtle anterior wedging of T12 unchanged. No lumbar spine compression fracture. No acute compression fracture. Mild grade 1 anterolisthesis of L5 on S1 unchanged. Disc space narrowing at the L5-S1 level. IMPRESSION: Mild spondylosis of the lumbar spine with disc disease at the L5-S1 level. Grade 1 anterolisthesis of L5 on S1 unchanged. No acute lumbar spine compression fracture. Mild chronic anterior wedging of T11 and T12. Electronically Signed   By: Elberta Fortis M.D.   On: 01/03/2018 14:54   Ct Hip Right Wo Contrast  Result Date: 01/03/2018 CLINICAL DATA:  Larey Seat last Thursday.  Severe right hip pain. EXAM: CT OF THE RIGHT HIP WITHOUT CONTRAST TECHNIQUE: Multidetector CT imaging of the right hip was performed according to the standard protocol. Multiplanar CT image reconstructions were also generated. COMPARISON:  Radiographs 01/03/2018.  CT scan 10/25/2014 FINDINGS: The right hip is normally located. Mild  degenerative changes. No acute fracture or evidence of AVN. No acetabular fracture. The pubic symphysis demonstrates degenerative changes but is intact. The right SI joint also demonstrates mild degenerative changes. No obvious intramuscular hematoma involving the hip for thigh muscles. No significant intrapelvic abnormalities. There is a simple appearing 3.5 cm right ovarian cyst and a calcified uterine fibroid. The cyst is unchanged since the prior CT scan from 2016. IMPRESSION: 1. Mild right hip joint degenerative changes but no fracture or AVN. 2. Intact right hemipelvis. Moderate degenerative changes at the pubic symphysis. Electronically Signed   By: Rudie Meyer M.D.   On: 01/03/2018 15:59   Dg Hip Unilat W Or Wo Pelvis 2-3 Views Right  Result Date: 01/03/2018 CLINICAL DATA:  Right hip and low back pain after trip and fall 12/28/2017. EXAM: DG HIP (WITH OR WITHOUT PELVIS) 2-3V RIGHT COMPARISON:  11/03/2017 FINDINGS: Mild symmetric degenerative change of the hips. No evidence of acute fracture or dislocation. Degenerative change of the spine. Remainder of the exam is unchanged. IMPRESSION: No acute findings. Electronically Signed   By: Elberta Fortis M.D.   On: 01/03/2018 14:52    Procedures Procedures (including critical care time)  Medications Ordered in ED Medications  ketorolac (TORADOL) 30 MG/ML injection 30 mg (30 mg Intramuscular Given 01/03/18 1449)  dexamethasone (DECADRON) injection 10 mg (10 mg Intramuscular Given 01/03/18 1448)     Initial Impression / Assessment and Plan / ED Course  I have reviewed the triage vital signs and the nursing notes.  Pertinent labs & imaging results that were available during my care of the patient were reviewed by me and considered in my medical decision making (see chart for details).     No evidence of fx.  Pt is able to ambulate.  Pt is stable for d/c.  Return if worse.    Final Clinical Impressions(s) / ED Diagnoses   Final diagnoses:    Fall, initial encounter  Contusion of right hip, initial encounter    ED Discharge Orders         Ordered    predniSONE (DELTASONE) 10 MG tablet  Daily     01/03/18 1607    HYDROcodone-acetaminophen (NORCO/VICODIN) 5-325 MG tablet  Every 4 hours PRN     01/03/18 1607           Jacalyn Lefevre, MD 01/03/18  1608  

## 2018-01-04 ENCOUNTER — Telehealth: Payer: Self-pay | Admitting: Orthopedic Surgery

## 2018-01-04 NOTE — Telephone Encounter (Signed)
Patient left message on voicemail about scheduling an er fol/up appointment. She was seen on 01/03/18 in the ER for hip pain. I called her back and she stated she was doing much better since she had gotten a shot in the ER and thought she would wait about an appointment. I explained to her that we would be glad to schedule her in the future if she needs [place] to, she was agreeable with this.

## 2018-11-08 ENCOUNTER — Other Ambulatory Visit (HOSPITAL_COMMUNITY): Payer: Self-pay | Admitting: Family Medicine

## 2018-11-08 DIAGNOSIS — Z1231 Encounter for screening mammogram for malignant neoplasm of breast: Secondary | ICD-10-CM

## 2018-11-14 ENCOUNTER — Ambulatory Visit (HOSPITAL_COMMUNITY)
Admission: RE | Admit: 2018-11-14 | Discharge: 2018-11-14 | Disposition: A | Discharge status: Home / Self Care | Payer: Medicare HMO | Source: Ambulatory Visit | Attending: Family Medicine | Admitting: Family Medicine

## 2018-11-14 ENCOUNTER — Other Ambulatory Visit: Payer: Self-pay

## 2018-11-14 DIAGNOSIS — Z1231 Encounter for screening mammogram for malignant neoplasm of breast: Secondary | ICD-10-CM | POA: Insufficient documentation

## 2018-11-20 ENCOUNTER — Other Ambulatory Visit (HOSPITAL_COMMUNITY): Payer: Self-pay | Admitting: Family Medicine

## 2018-11-20 DIAGNOSIS — R928 Other abnormal and inconclusive findings on diagnostic imaging of breast: Secondary | ICD-10-CM

## 2018-11-23 ENCOUNTER — Ambulatory Visit (HOSPITAL_COMMUNITY): Admission: RE | Admit: 2018-11-23 | Payer: Medicare HMO | Source: Ambulatory Visit

## 2018-11-23 ENCOUNTER — Other Ambulatory Visit: Payer: Self-pay

## 2018-11-23 ENCOUNTER — Ambulatory Visit (HOSPITAL_COMMUNITY)
Admission: RE | Admit: 2018-11-23 | Discharge: 2018-11-23 | Disposition: A | Discharge status: Home / Self Care | Payer: Medicare HMO | Source: Ambulatory Visit | Attending: Family Medicine | Admitting: Family Medicine

## 2018-11-23 DIAGNOSIS — R928 Other abnormal and inconclusive findings on diagnostic imaging of breast: Secondary | ICD-10-CM | POA: Insufficient documentation

## 2018-11-26 ENCOUNTER — Ambulatory Visit
Admission: EM | Admit: 2018-11-26 | Discharge: 2018-11-26 | Disposition: A | Discharge status: Home / Self Care | Payer: Medicare HMO | Attending: Emergency Medicine | Admitting: Emergency Medicine

## 2018-11-26 ENCOUNTER — Ambulatory Visit (INDEPENDENT_AMBULATORY_CARE_PROVIDER_SITE_OTHER): Payer: Medicare HMO

## 2018-11-26 ENCOUNTER — Other Ambulatory Visit: Payer: Self-pay

## 2018-11-26 DIAGNOSIS — S92325A Nondisplaced fracture of second metatarsal bone, left foot, initial encounter for closed fracture: Secondary | ICD-10-CM | POA: Diagnosis not present

## 2018-11-26 DIAGNOSIS — M25572 Pain in left ankle and joints of left foot: Secondary | ICD-10-CM

## 2018-11-26 NOTE — Discharge Instructions (Signed)
X-rays concerning for metatarsal fracture of left foot.  Pending radiologist interpretation Continue conservative management of rest, ice, and elevationg Continue with ibuprofen and tylenol as needed for pain.  Reserve tramadol for severe break-through pain.   Follow up with orthopedist for further evaluation and management Return or go to the ER if you have any new or worsening symptoms (fever, chills, increased redness, swelling, bruising, symptoms do not improve with medication, etc...)

## 2018-11-26 NOTE — ED Provider Notes (Signed)
[facility]   [id number] 11/26/18 Arrival Time: 1033  CC: Left foot pain  SUBJECTIVE: History from: patient. Kathryn FeilDonna Russell-Strader is a 68 y.o. female complains of left foot pain x 4 days ago.  Symptoms began after stepping off curb.  Localizes the pain to the top of foot.  Describes the pain as intermittent and sharp in character.  Has tried tramadol with relief.  Symptoms are made worse with weight bearing and walking.  Denies similar symptoms in the past. Complains of associated swelling, and weakness.  Denies fever, chills, erythema, ecchymosis, numbness and tingling.  ROS: As per HPI.  All other pertinent ROS negative.     Past Medical History:  Diagnosis Date  . Asthma   . COPD (chronic obstructive pulmonary disease) (HCC)    questionable  . Degeneration of intervertebral disc, site unspecified   . Depression   . DJD (degenerative joint disease)   . Dyslipidemia   . GERD (gastroesophageal reflux disease)   . Pneumonia 2/15  . Shortness of breath    on exertion  . Sleep apnea    stopbang=4  . Unspecified essential hypertension    Past Surgical History:  Procedure Laterality Date  . APPENDECTOMY    . ECTOPIC PREGNANCY SURGERY    . KNEE ARTHROSCOPY  2010    Right knee  . KNEE ARTHROSCOPY  2012   Left knee  . TOTAL KNEE ARTHROPLASTY  11/22/2011   Procedure: TOTAL KNEE ARTHROPLASTY;  Surgeon: Drucilla SchmidtJames P Aplington, MD;  Location: [facility];  Service: Orthopedics;  Laterality: Left;  . TOTAL KNEE REVISION Left 09/23/2013   Procedure: REVISION LEFT TOTAL KNEE WITH POLY EXCHANGE UPSIZE AND PATELLA REVISION;  Surgeon: Shelda PalMatthew D Olin, MD;  Location: [facility];  Service: Orthopedics;  Laterality: Left;   Allergies  Allergen Reactions  . Buprenorphine Hcl Anaphylaxis and Itching  . Ace Inhibitors Other (See Comments)   No current facility-administered medications on file prior to encounter.    Current Outpatient Medications on File Prior to Encounter  Medication Sig  Dispense Refill  . albuterol (PROVENTIL HFA;VENTOLIN HFA) 108 (90 Base) MCG/ACT inhaler Inhale 1-2 puffs into the lungs every 6 (six) hours as needed for wheezing or shortness of breath. 1 Inhaler 6  . amLODipine (NORVASC) 5 MG tablet Take 1 tablet (5 mg total) by mouth daily. NEEDS APPOINTMENT FOR FUTURE REFILLS (Patient taking differently: Take 5 mg by mouth daily. ) 30 tablet 0  . aspirin 81 MG tablet Take 81 mg by mouth daily.    [name] Kitchen. atorvastatin (LIPITOR) 10 MG tablet Take 10 mg by mouth daily.    . budesonide-formoterol (SYMBICORT) 80-4.5 MCG/ACT inhaler Inhale 2 puffs into the lungs 2 (two) times daily. 2 Inhaler 0  . calcium-vitamin D 250-100 MG-UNIT per tablet Take 1 tablet by mouth daily.    . cloNIDine (CATAPRES) 0.2 MG tablet Take 0.2 mg by mouth at bedtime.     [name] Kitchen. HYDROcodone-acetaminophen (NORCO/VICODIN) 5-325 MG tablet Take 1 tablet by mouth every 4 (four) hours as needed. 10 tablet 0  . meloxicam (MOBIC) 15 MG tablet Take 15 mg by mouth at bedtime.     . Multiple Vitamin (MULTIVITAMIN) capsule Take 1 capsule by mouth daily.     [name] Omeprazole-Sodium Bicarbonate (ZEGERID OTC) 20-1100 MG CAPS capsule Take 1 capsule by mouth every evening.     . traMADol (ULTRAM) 50 MG tablet Take 50 mg by mouth every 6 (six) hours as needed for moderate pain or severe pain. *Takes one tablet  at bedtime     Social History   Socioeconomic History  . Marital status: Married    Spouse name: Not on file  . Number of children: 2  . Years of education: Not on file  . Highest education level: Not on file  Occupational History  . Occupation: Nurse    Employer: [employer]  Social Needs  . Financial resource strain: Not on file  . Food insecurity    Worry: Not on file    Inability: Not on file  . Transportation needs    Medical: Not on file    Non-medical: Not on file  Tobacco Use  . Smoking status: Passive Smoke Exposure - Never Smoker  . Smokeless tobacco: Never Used  . Tobacco comment: both  parents smoked  Substance and Sexual Activity  . Alcohol use: Yes    Alcohol/week: 1.0 standard drinks    Types: 1 Glasses of wine per week    Comment: occasional glass of wine  . Drug use: No  . Sexual activity: Not on file  Lifestyle  . Physical activity    Days per week: Not on file    Minutes per session: Not on file  . Stress: Not on file  Relationships  . Social Musician on phone: Not on file    Gets together: Not on file    Attends religious service: Not on file    Active member of club or organization: Not on file    Attends meetings of clubs or organizations: Not on file    Relationship status: Not on file  . Intimate partner violence    Fear of current or ex partner: Not on file    Emotionally abused: Not on file    Physically abused: Not on file    Forced sexual activity: Not on file  Other Topics Concern  . Not on file  Social History Narrative  . Not on file   Family History  Problem Relation Age of Onset  . Lung cancer Father   . COPD Mother   . Hypertension Mother   . Alcohol abuse Brother   . Melanoma Brother     OBJECTIVE:  Vitals:   11/26/18 1047  BP: 137/87  Pulse: 87  Resp: 20  Temp: 97.9 F (36.6 C)  SpO2: 94%    General appearance: ALERT; in no acute distress.  Head: NCAT Lungs: Normal respiratory effort CV: Dorsalis pedis pulses 2+. Cap refill < 2 seconds Musculoskeletal: Left foot Inspection: Obvious diffuse swelling about the left proximal foot Palpation: TTP over 2nd and 3rd MT ROM: FROM active and passive Strength:  4+/5 dorsiflexion, 4+/5 plantar flexion Stability: Anterior/ posterior drawer intact Skin: warm and dry Neurologic: Ambulates with mild difficulty; Sensation intact about the lower extremities Psychological: alert and cooperative; normal mood and affect  DIAGNOSTIC STUDIES:  Dg Foot Complete Left  Result Date: 11/26/2018 CLINICAL DATA:  68 year old female with trauma to the left foot. EXAM: LEFT  FOOT - COMPLETE 3+ VIEW COMPARISON:  Left over radiograph dated 11/03/2017 FINDINGS: There is a nondisplaced transverse fracture of the proximal aspect proximal phalanx of the fifth toe, age indeterminate. Clinical correlation is recommended. Tiny corner fragment from the lateral aspect of the proximal phalanx of the fifth toe is also noted. There is a nondisplaced fracture of midportion of the second metatarsal. A fixation screw noted in the distal first metatarsal. There is no dislocation. The bones are osteopenic. Degenerative changes of the tarsometatarsal joints. There  is diffuse subcutaneous edema. IMPRESSION: 1. Acute nondisplaced fracture of the second metatarsal. 2. Age indeterminate fractures of the proximal phalanx of the fifth digit. Electronically Signed   By: Anner Crete M.D.   On: 11/26/2018 11:22    My interpretation:  X-rays positive for second MT fracture and soft tissue swelling.   I have reviewed the x-rays myself and the radiologist interpretation. I am in agreement with the radiologist interpretation.     ASSESSMENT & PLAN:  1. Closed nondisplaced fracture of second metatarsal bone of left foot, initial encounter   2. Pain of joint of left ankle and foot    X-rays concerning for metatarsal fracture of left foot.  Possible proximal fifth metatarsal fracture as well.   Continue conservative management of rest, ice, and elevation Continue with ibuprofen and tylenol as needed for pain.  Reserve tramadol for severe break-through pain.   Follow up with orthopedist for further evaluation and management Return or go to the ER if you have any new or worsening symptoms (fever, chills, increased redness, swelling, bruising, symptoms do not improve with medication, etc...)   Reviewed expectations re: course of current medical issues. Questions answered. Outlined signs and symptoms indicating need for more acute intervention. Patient verbalized understanding. After Visit Summary  given.    Lestine Box, PA-C 11/26/18 1146

## 2018-11-26 NOTE — ED Triage Notes (Signed)
Pt stepped off curb on Thursday and began having pain in left foot that is worsening, swelling noted to toes and top of foot

## 2018-11-29 ENCOUNTER — Encounter: Payer: Self-pay | Admitting: Orthopaedic Surgery

## 2018-11-29 ENCOUNTER — Other Ambulatory Visit: Payer: Self-pay

## 2018-11-29 ENCOUNTER — Ambulatory Visit: Payer: Medicare HMO | Admitting: Orthopaedic Surgery

## 2018-11-29 VITALS — Temp 97.1°F | Ht 65.0 in | Wt 234.0 lb

## 2018-11-29 DIAGNOSIS — S92324A Nondisplaced fracture of second metatarsal bone, right foot, initial encounter for closed fracture: Secondary | ICD-10-CM | POA: Diagnosis not present

## 2018-11-29 DIAGNOSIS — S92325A Nondisplaced fracture of second metatarsal bone, left foot, initial encounter for closed fracture: Secondary | ICD-10-CM

## 2018-11-29 DIAGNOSIS — S92515A Nondisplaced fracture of proximal phalanx of left lesser toe(s), initial encounter for closed fracture: Secondary | ICD-10-CM | POA: Diagnosis not present

## 2018-11-29 NOTE — Progress Notes (Signed)
Subjective:    Patient ID: Kathryn Olsen, female    DOB: [birth year], 68 y.o.   MRN: [id number]  HPI She hurt her foot on the left around November 22, 2018.  She had stepped off curb wrong.  The pain continued and she went to the ER on 11-26-2018.  X-rays showed: IMPRESSION: 1. Acute nondisplaced fracture of the second metatarsal. 2. Age indeterminate fractures of the proximal phalanx of the fifth digit.  She was given post op shoe. She does not like it as it move around and is uncomfortable.  She will be given CAM walker.  She has no other injury. Pain is controlled.   Review of Systems  Constitutional: Positive for activity change.  Respiratory: Positive for shortness of breath.   Musculoskeletal: Positive for gait problem and joint swelling.  All other systems reviewed and are negative.  For Review of Systems, all other systems reviewed and are negative.  The following is a summary of the past history medically, past history surgically, known current medicines, social history and family history.  This information is gathered electronically by the computer from prior information and documentation.  I review this each visit and have found including this information at this point in the chart is beneficial and informative.   Past Medical History:  Diagnosis Date  . Asthma   . COPD (chronic obstructive pulmonary disease) (HCC)    questionable  . Degeneration of intervertebral disc, site unspecified   . Depression   . DJD (degenerative joint disease)   . Dyslipidemia   . GERD (gastroesophageal reflux disease)   . Pneumonia 2/15  . Shortness of breath    on exertion  . Sleep apnea    stopbang=4  . Unspecified essential hypertension     Past Surgical History:  Procedure Laterality Date  . APPENDECTOMY    . ECTOPIC PREGNANCY SURGERY    . KNEE ARTHROSCOPY  2010    Right knee  . KNEE ARTHROSCOPY  2012   Left knee  . 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 TOTAL KNEE REVISION Left 09/23/2013   Procedure: REVISION LEFT TOTAL KNEE WITH POLY EXCHANGE UPSIZE AND PATELLA REVISION;  Surgeon: Shelda Pal, MD;  Location: [facility];  Service: Orthopedics;  Laterality: Left;    Current Outpatient Medications on File Prior to Visit  Medication Sig Dispense Refill  . albuterol (PROVENTIL HFA;VENTOLIN HFA) 108 (90 Base) MCG/ACT inhaler Inhale 1-2 puffs into the lungs every 6 (six) hours as needed for wheezing or shortness of breath. 1 Inhaler 6  . amLODipine (NORVASC) 5 MG tablet Take 1 tablet (5 mg total) by mouth daily. NEEDS APPOINTMENT FOR FUTURE REFILLS (Patient taking differently: Take 5 mg by mouth daily. ) 30 tablet 0  . aspirin 81 MG tablet Take 81 mg by mouth daily.    [name] Kitchen atorvastatin (LIPITOR) 10 MG tablet Take 10 mg by mouth daily.    . budesonide-formoterol (SYMBICORT) 80-4.5 MCG/ACT inhaler Inhale 2 puffs into the lungs 2 (two) times daily. 2 Inhaler 0  . calcium-vitamin D 250-100 MG-UNIT per tablet Take 1 tablet by mouth daily.    . cloNIDine (CATAPRES) 0.2 MG tablet Take 0.2 mg by mouth at bedtime.     [name] Kitchen HYDROcodone-acetaminophen (NORCO/VICODIN) 5-325 MG tablet Take 1 tablet by mouth every 4 (four) hours as needed. 10 tablet 0  . meloxicam (MOBIC) 15  MG tablet Take 15 mg by mouth at bedtime.     . Multiple Vitamin (MULTIVITAMIN) capsule Take 1 capsule by mouth daily.     Earney Navy Bicarbonate (ZEGERID OTC) 20-1100 MG CAPS capsule Take 1 capsule by mouth every evening.     . traMADol (ULTRAM) 50 MG tablet Take 50 mg by mouth every 6 (six) hours as needed for moderate pain or severe pain. *Takes one tablet at bedtime     No current facility-administered medications on file prior to visit.     Social History   Socioeconomic History  . Marital status: Married    Spouse name: Not on file  . Number of  children: 2  . Years of education: Not on file  . Highest education level: Not on file  Occupational History  . Occupation: Nurse    Employer: [employer]  Social Needs  . Financial resource strain: Not on file  . Food insecurity    Worry: Not on file    Inability: Not on file  . Transportation needs    Medical: Not on file    Non-medical: Not on file  Tobacco Use  . Smoking status: Passive Smoke Exposure - Never Smoker  . Smokeless tobacco: Never Used  . Tobacco comment: both parents smoked  Substance and Sexual Activity  . Alcohol use: Yes    Alcohol/week: 1.0 standard drinks    Types: 1 Glasses of wine per week    Comment: occasional glass of wine  . Drug use: No  . Sexual activity: Not on file  Lifestyle  . Physical activity    Days per week: Not on file    Minutes per session: Not on file  . Stress: Not on file  Relationships  . Social Herbalist on phone: Not on file    Gets together: Not on file    Attends religious service: Not on file    Active member of club or organization: Not on file    Attends meetings of clubs or organizations: Not on file    Relationship status: Not on file  . Intimate partner violence    Fear of current or ex partner: Not on file    Emotionally abused: Not on file    Physically abused: Not on file    Forced sexual activity: Not on file  Other Topics Concern  . Not on file  Social History Narrative  . Not on file    Family History  Problem Relation Age of Onset  . Lung cancer Father   . COPD Mother   . Hypertension Mother   . Alcohol abuse Brother   . Melanoma Brother     Temp (!) 97.1 F (36.2 C)   Ht 5\' 5"  (1.651 m)   Wt 234 lb (106.1 kg)   BMI 38.94 kg/m   Body mass index is 38.94 kg/m.      Objective:   Physical Exam Vitals signs reviewed.  Constitutional:      Appearance: She is well-developed.  HENT:     Head: Normocephalic and atraumatic.  Eyes:     Conjunctiva/sclera: Conjunctivae  normal.     Pupils: Pupils are equal, round, and reactive to light.  Neck:     Musculoskeletal: Normal range of motion and neck supple.  Cardiovascular:     Rate and Rhythm: Normal rate and regular rhythm.  Pulmonary:     Effort: Pulmonary effort is normal.  Abdominal:     Palpations: Abdomen  is soft.  Musculoskeletal:       Feet:  Skin:    General: Skin is warm and dry.  Neurological:     Mental Status: She is alert and oriented to person, place, and time.     Cranial Nerves: No cranial nerve deficit.     Motor: No abnormal muscle tone.     Coordination: Coordination normal.     Deep Tendon Reflexes: Reflexes are normal and symmetric. Reflexes normal.  Psychiatric:        Behavior: Behavior normal.        Thought Content: Thought content normal.        Judgment: Judgment normal.      The ER record and x-rays and report reviewed.     Assessment & Plan:   Encounter Diagnoses  Name Primary?  . Closed nondisplaced fracture of second metatarsal bone of right foot, initial encounter Yes  . Closed nondisplaced fracture of proximal phalanx of lesser toe of left foot, initial encounter    She is given CAM walker.  She is going out of town in two weeks to see her father-in-law who is turning 103.  She will be going to [place].  I will be out of town and I will have Dr. Romeo AppleHarrison see her before she leaves on her trip.  X-rays of the left foot on return.  Tylenol or Advil for pain.  Call if any problem.  Precautions discussed.   Electronically Signed Darreld McleanWayne Versia Mignogna, MD 9/17/20208:20 AM

## 2018-12-13 ENCOUNTER — Other Ambulatory Visit: Payer: Self-pay

## 2018-12-13 ENCOUNTER — Ambulatory Visit (INDEPENDENT_AMBULATORY_CARE_PROVIDER_SITE_OTHER): Payer: Medicare HMO | Admitting: Otolaryngology

## 2018-12-13 DIAGNOSIS — R490 Dysphonia: Secondary | ICD-10-CM

## 2018-12-14 ENCOUNTER — Ambulatory Visit: Payer: Medicare HMO | Admitting: Orthopedic Surgery

## 2018-12-18 ENCOUNTER — Telehealth: Payer: Self-pay | Admitting: Orthopaedic Surgery

## 2018-12-18 ENCOUNTER — Encounter: Payer: Self-pay | Admitting: Orthopaedic Surgery

## 2018-12-18 NOTE — Telephone Encounter (Addendum)
This patient was to have seen Dr Aline Brochure on Friday, 12/14/18 because Dr. Luna Glasgow was out of town and then the patient was going out of town.  She did not show up for this appointment so I called and left a message for her to call the office but she did not.  I called again today and asked her to give our office a call to reschedule an appointment.  I will also sent her a letter asking her to call the office to reschedule the appointment.

## 2019-04-28 ENCOUNTER — Ambulatory Visit: Payer: Medicare HMO | Attending: Internal Medicine

## 2019-04-28 DIAGNOSIS — Z23 Encounter for immunization: Secondary | ICD-10-CM | POA: Insufficient documentation

## 2019-04-28 NOTE — Progress Notes (Signed)
   Covid-19 Vaccination Clinic  Name:  Breezie Micucci    MRN: [id number] DOB: Nov 10, 1950  04/28/2019  Ms. Russell-Strader was observed post Covid-19 immunization for 15 minutes without incidence. She was provided with Vaccine Information Sheet and instruction to access the V-Safe system.   Ms. Kiesel was instructed to call 911 with any severe reactions post vaccine: [name] Kitchen Difficulty breathing  . Swelling of your face and throat  . A fast heartbeat  . A bad rash all over your body  . Dizziness and weakness    Immunizations Administered    Name Date Dose VIS Date Route   Pfizer COVID-19 Vaccine 04/28/2019 10:30 AM 0.3 mL 02/22/2019 Intramuscular   Manufacturer: [employer], [employer]   Lot: [id number]   NDC: [address]-7

## 2019-05-28 ENCOUNTER — Ambulatory Visit: Payer: Medicare HMO

## 2019-10-08 ENCOUNTER — Encounter: Payer: Self-pay | Admitting: Emergency Medicine

## 2019-10-08 ENCOUNTER — Ambulatory Visit
Admission: EM | Admit: 2019-10-08 | Discharge: 2019-10-08 | Disposition: A | Discharge status: Home / Self Care | Payer: Medicare HMO

## 2019-10-08 DIAGNOSIS — I10 Essential (primary) hypertension: Secondary | ICD-10-CM

## 2019-10-08 NOTE — Discharge Instructions (Addendum)
Call your doctor for follow up Recommend increasing the blood pressure medicine to 10 mg daily Monitor your blood pressure.  Follow up as needed for continued or worsening symptoms

## 2019-10-08 NOTE — ED Triage Notes (Signed)
BP was 200's systolic yesterday, seen by pcp and told to recheck this morning and she has a follow up appointment on Thursday. States her bp this morning a hour after bp med was 186/106

## 2019-10-09 NOTE — ED Provider Notes (Signed)
[facility]    CSN: [id number] Arrival date & time: 10/08/19  1029      History   Chief Complaint Chief Complaint  Patient presents with  . Hypertension    HPI Kathryn Olsen is a 69 y.o. female.   Patient is a 69 year old female past medical history of hypertension, asthma, COPD, GERD, dyslipidemia, pneumonia, shortness of breath.  She presents today with elevated blood pressure readings.  Reporting readings as high as 200 systolic yesterday.  She has a follow-up appointment on Thursday but was concerned.  BP this morning an hour after taking her medication was 186/106.  She has had some mild headache with this but none today.  No dizziness, blurred vision, numbness, tingling, weakness, chest pain or shortness of breath.  She takes 5 mg of amlodipine daily and clonidine at night.  She has been taking this medication for a while.  Came here for blood pressure check due to not wanting to drive all the way to her doctor's office for this. ROS per HPI      Past Medical History:  Diagnosis Date  . Asthma   . COPD (chronic obstructive pulmonary disease) (HCC)    questionable  . Degeneration of intervertebral disc, site unspecified   . Depression   . DJD (degenerative joint disease)   . Dyslipidemia   . GERD (gastroesophageal reflux disease)   . Pneumonia 2/15  . Shortness of breath    on exertion  . Sleep apnea    stopbang=4  . Unspecified essential hypertension     Patient Active Problem List   Diagnosis Date Noted  . Asthma, chronic 11/29/2013  . Other malaise and fatigue 11/29/2013  . Shortness of breath 10/28/2013  . Obesity, unspecified 10/28/2013  . S/P left knee revision 09/23/2013  . DOE (dyspnea on exertion) 08/09/2013  . Preoperative cardiovascular examination 08/09/2013  . Pre-operative cardiovascular examination 08/09/2013  . Chest pain 08/09/2013  . Dyspnea 08/09/2013  . Uncontrolled hypertension 08/09/2013  . Nonspecific (abnormal)  findings on radiological and other examination of gastrointestinal tract 04/26/2011  . Esophageal reflux 04/26/2011  . Chest pain, unspecified 04/26/2011  . Special screening for malignant neoplasms, colon 04/26/2011  . DYSLIPIDEMIA 09/03/2008  . Malignant hypertension 09/03/2008  . DEGENERATIVE DISC DISEASE 09/03/2008    Past Surgical History:  Procedure Laterality Date  . APPENDECTOMY    . ECTOPIC PREGNANCY SURGERY    . KNEE ARTHROSCOPY  2010    Right knee  . KNEE ARTHROSCOPY  2012   Left knee  . 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 TOTAL KNEE REVISION Left 09/23/2013   Procedure: REVISION LEFT TOTAL KNEE WITH POLY EXCHANGE UPSIZE AND PATELLA REVISION;  Surgeon: Shelda Pal, MD;  Location: [facility];  Service: Orthopedics;  Laterality: Left;    OB History   No obstetric history on file.      Home Medications    Prior to Admission medications   Medication Sig Start Date End Date Taking? Authorizing Provider  albuterol (PROVENTIL HFA;VENTOLIN HFA) 108 (90 Base) MCG/ACT inhaler Inhale 1-2 puffs into the lungs every 6 (six) hours as needed for wheezing or shortness of breath. 11/05/15  Yes McQuaid, Brooke Pace, MD  amLODipine (NORVASC) 5 MG tablet Take 1 tablet (5 mg total) by mouth daily. NEEDS APPOINTMENT FOR FUTURE REFILLS Patient taking differently: Take 5 mg by mouth daily.  10/31/14  Yes HiltyLisette Abu, MD  aspirin 81 MG tablet Take 81 mg by mouth daily.   Yes [provider]  atorvastatin (LIPITOR) 10 MG tablet Take 10 mg by mouth daily. 11/28/17  Yes [provider]  budesonide-formoterol (SYMBICORT) 80-4.5 MCG/ACT inhaler Inhale 2 puffs into the lungs 2 (two) times daily. 11/05/15  Yes Lupita Leash, MD  calcium-vitamin D 250-100 MG-UNIT per tablet Take 1 tablet by mouth daily.   Yes [provider]  cloNIDine  (CATAPRES) 0.2 MG tablet Take 0.2 mg by mouth at bedtime.    Yes [provider]  meloxicam (MOBIC) 15 MG tablet Take 15 mg by mouth at bedtime.    Yes [provider]  Multiple Vitamin (MULTIVITAMIN) capsule Take 1 capsule by mouth daily.    Yes [provider]  Omeprazole-Sodium Bicarbonate (ZEGERID OTC) 20-1100 MG CAPS capsule Take 1 capsule by mouth every evening.    Yes [provider]  traMADol (ULTRAM) 50 MG tablet Take 50 mg by mouth every 6 (six) hours as needed for moderate pain or severe pain. *Takes one tablet at bedtime   Yes [provider]  HYDROcodone-acetaminophen (NORCO/VICODIN) 5-325 MG tablet Take 1 tablet by mouth every 4 (four) hours as needed. 01/03/18   Jacalyn Lefevre, MD    Family History Family History  Problem Relation Age of Onset  . Lung cancer Father   . COPD Mother   . Hypertension Mother   . Alcohol abuse Brother   . Melanoma Brother     Social History Social History   Tobacco Use  . Smoking status: Passive Smoke Exposure - Never Smoker  . Smokeless tobacco: Never Used  . Tobacco comment: both parents smoked  Substance Use Topics  . Alcohol use: Yes    Alcohol/week: 1.0 standard drink    Types: 1 Glasses of wine per week    Comment: occasional glass of wine  . Drug use: No     Allergies   Buprenorphine hcl and Ace inhibitors   Review of Systems Review of Systems   Physical Exam Triage Vital Signs ED Triage Vitals  Enc Vitals Group     BP 10/08/19 1052 (!) 178/105     Pulse Rate 10/08/19 1052 63     Resp 10/08/19 1052 19     Temp 10/08/19 1052 98.9 F (37.2 C)     Temp Source 10/08/19 1052 Oral     SpO2 10/08/19 1052 95 %     Weight 10/08/19 1050 (!) 230 lb (104.3 kg)     Height 10/08/19 1050 5\' 6"  (1.676 m)     Head Circumference --      Peak Flow --      Pain Score 10/08/19 1050 0     Pain Loc --      Pain Edu? --      Excl. in GC? --    No data found.  Updated Vital  Signs BP (!) 178/105 (BP Location: Right Arm)   Pulse 63   Temp 98.9 F (37.2 C) (Oral)   Resp 19   Ht 5\' 6"  (1.676 m)   Wt (!) 230 lb (104.3 kg)   SpO2 95%   BMI 37.12 kg/m   Visual Acuity Right Eye Distance:   Left Eye Distance:   Bilateral Distance:    Right Eye Near:   Left Eye Near:    Bilateral Near:     Physical Exam Vitals and nursing note reviewed.  Constitutional:  General: She is not in acute distress.    Appearance: Normal appearance. She is not ill-appearing, toxic-appearing or diaphoretic.  HENT:     Head: Normocephalic.     Nose: Nose normal.  Eyes:     Conjunctiva/sclera: Conjunctivae normal.  Cardiovascular:     Rate and Rhythm: Normal rate and regular rhythm.  Pulmonary:     Effort: Pulmonary effort is normal.     Breath sounds: Normal breath sounds.  Musculoskeletal:        General: Normal range of motion.     Cervical back: Normal range of motion.  Skin:    General: Skin is warm and dry.     Findings: No rash.  Neurological:     General: No focal deficit present.     Mental Status: She is alert.  Psychiatric:        Mood and Affect: Mood normal.      UC Treatments / Results  Labs (all labs ordered are listed, but only abnormal results are displayed) Labs Reviewed - No data to display  EKG   Radiology No results found.  Procedures Procedures (including critical care time)  Medications Ordered in UC Medications - No data to display  Initial Impression / Assessment and Plan / UC Course  I have reviewed the triage vital signs and the nursing notes.  Pertinent labs & imaging results that were available during my care of the patient were reviewed by me and considered in my medical decision making (see chart for details).     Essential hypertension Patient with elevated blood pressure readings over the past week.  She has been taking her medication as prescribed.  Reporting headaches that come and go. Has follow-up ointment  on Thursday to see her doctor. Recommend increasing her amlodipine  to 10 mg daily and monitor her blood pressures. For any new or concerning symptoms associated with her elevated blood pressure she will need to go to the ER. Final Clinical Impressions(s) / UC Diagnoses   Final diagnoses:  Essential hypertension     Discharge Instructions     Call your doctor for follow up Recommend increasing the blood pressure medicine to 10 mg daily Monitor your blood pressure.  Follow up as needed for continued or worsening symptoms     ED Prescriptions    None     PDMP not reviewed this encounter.   Dahlia Byes A, NP 10/09/19 1411

## 2019-10-14 ENCOUNTER — Ambulatory Visit: Payer: Medicare HMO | Admitting: Cardiology

## 2019-10-14 ENCOUNTER — Encounter: Payer: Self-pay | Admitting: Cardiology

## 2019-10-14 ENCOUNTER — Other Ambulatory Visit: Payer: Self-pay

## 2019-10-14 VITALS — BP 167/99 | HR 85 | Resp 14 | Ht 66.0 in | Wt 238.0 lb

## 2019-10-14 DIAGNOSIS — I499 Cardiac arrhythmia, unspecified: Secondary | ICD-10-CM

## 2019-10-14 DIAGNOSIS — I10 Essential (primary) hypertension: Secondary | ICD-10-CM

## 2019-10-14 DIAGNOSIS — R42 Dizziness and giddiness: Secondary | ICD-10-CM | POA: Insufficient documentation

## 2019-10-14 MED ORDER — LOSARTAN POTASSIUM-HCTZ 50-12.5 MG PO TABS: 1.0000 | ORAL_TABLET | Freq: Every day | ORAL | 2 refills | Status: DC

## 2019-10-14 NOTE — Progress Notes (Addendum)
Patient referred by Bartholome Bill, MD for dizziness, irregular heart beat  Subjective:   Kathryn Olsen, female    DOB: November 30, 1950, 69 y.o.   MRN: [id number]   Chief Complaint  Patient presents with  . Hypertension  . Irregular Heart Beat  . Dizziness  . New Patient (Initial Visit)    HPI  69 y.o. Caucasain female with hypertension, OSA, COPD, referred for evaluation of dizziness, irregular heart beat  Patient reports generalized fatigue, dizziness, headache. She also reports exertional dyspnea, without any overt orthopnea, PND, leg edema. She reports upper back pain, unrelated to exertion.  She also notices episodes of irregular heartbeat lasting for few seconds, usually not associated with any other symptoms.  Past Medical History:  Diagnosis Date  . Asthma   . COPD (chronic obstructive pulmonary disease) ([place])    questionable  . Degeneration of intervertebral disc, site unspecified   . Depression   . DJD (degenerative joint disease)   . Dyslipidemia   . GERD (gastroesophageal reflux disease)   . Pneumonia 2/15  . Shortness of breath    on exertion  . Sleep apnea    stopbang=4  . Unspecified essential hypertension      Past Surgical History:  Procedure Laterality Date  . APPENDECTOMY    . ECTOPIC PREGNANCY SURGERY    . KNEE ARTHROSCOPY  2010    Right knee  . KNEE ARTHROSCOPY  2012   Left knee  . TOTAL KNEE ARTHROPLASTY  11/22/2011   Procedure: TOTAL KNEE ARTHROPLASTY;  Surgeon: Magnus Sinning, MD;  Location: [facility];  Service: Orthopedics;  Laterality: Left;  . TOTAL KNEE REVISION Left 09/23/2013   Procedure: REVISION LEFT TOTAL KNEE WITH POLY EXCHANGE UPSIZE AND PATELLA REVISION;  Surgeon: Mauri Pole, MD;  Location: [facility];  Service: Orthopedics;  Laterality: Left;     Social History   Tobacco Use  Smoking Status Passive Smoke Exposure - Never Smoker  Smokeless Tobacco Never Used  Tobacco Comment   both parents smoked    Social  History   Substance and Sexual Activity  Alcohol Use Yes  . Alcohol/week: 1.0 standard drink  . Types: 1 Glasses of wine per week   Comment: occasional glass of wine     Family History  Problem Relation Age of Onset  . Lung cancer Father   . COPD Mother   . Hypertension Mother   . Alcohol abuse Brother   . Melanoma Brother      Current Outpatient Medications on File Prior to Visit  Medication Sig Dispense Refill  . amLODipine (NORVASC) 5 MG tablet Take 1 tablet (5 mg total) by mouth daily. NEEDS APPOINTMENT FOR FUTURE REFILLS (Patient taking differently: Take 10 mg by mouth daily. ) 30 tablet 0  . aspirin 81 MG tablet Take 81 mg by mouth daily.    [name] Kitchen atorvastatin (LIPITOR) 10 MG tablet Take 10 mg by mouth daily.    . budesonide-formoterol (SYMBICORT) 80-4.5 MCG/ACT inhaler Inhale 2 puffs into the lungs 2 (two) times daily. 2 Inhaler 0  . calcium-vitamin D 250-100 MG-UNIT per tablet Take 1 tablet by mouth daily.    [name] Kitchen losartan (COZAAR) 25 MG tablet Take 1 tablet by mouth daily.    . meloxicam (MOBIC) 15 MG tablet Take 15 mg by mouth at bedtime.     . Multiple Vitamin (MULTIVITAMIN) capsule Take 1 capsule by mouth daily.     . traMADol (ULTRAM) 50 MG tablet Take 50 mg by mouth  every 6 (six) hours as needed for moderate pain or severe pain. *Takes one tablet at bedtime    . albuterol (PROVENTIL HFA;VENTOLIN HFA) 108 (90 Base) MCG/ACT inhaler Inhale 1-2 puffs into the lungs every 6 (six) hours as needed for wheezing or shortness of breath. (Patient not taking: Reported on 10/14/2019) 1 Inhaler 6  . Omeprazole-Sodium Bicarbonate (ZEGERID OTC) 20-1100 MG CAPS capsule Take 1 capsule by mouth every evening.  (Patient not taking: Reported on 10/14/2019)     No current facility-administered medications on file prior to visit.    Cardiovascular and other pertinent studies:  EKG 10/14/2019: Sinus rhythm 81 bpm  Borderline left atrial enlargement Incomplete right bundle branch block Occasional  PAC   Recent labs: 10/07/2019: Glucose 105, BUN/Cr 18/0.68. EGFR 88. Na/K 142/4.4. Rest of the CMP normal H/H 16/50. MCV 88. Platelets 247 HbA1C N/A Chol 187, TG 159, HDL 53, LDL 102 TSH 2.7 normal    Review of Systems  Constitutional: Positive for malaise/fatigue.  Cardiovascular: Positive for dyspnea on exertion and irregular heartbeat. Negative for chest pain, leg swelling, palpitations and syncope.  Neurological: Positive for dizziness.         Vitals:   10/14/19 1317  BP: (!) 173/112  Pulse: 79  Resp: 14  SpO2: 97%     Body mass index is 38.41 kg/m. Filed Weights   10/14/19 1317  Weight: 238 lb (108 kg)     Objective:   Physical Exam Vitals and nursing note reviewed.  Constitutional:      General: She is not in acute distress. Neck:     Vascular: No JVD.  Cardiovascular:     Rate and Rhythm: Normal rate and regular rhythm. Occasional extrasystoles are present.    Pulses: Normal pulses.     Heart sounds: Normal heart sounds. No murmur heard.   Pulmonary:     Effort: Pulmonary effort is normal.     Breath sounds: Normal breath sounds. No wheezing or rales.         Assessment & Recommendations:   69 y.o. Caucasain female with hypertension, OSA, COPD, referred for evaluation of dizziness, irregular heart beat  Dizziness: I suspect this is related to uncontrolled blood pressure.  Stop losartan 25 mg.  Start losartan-HCTZ 50-12.5 mg daily.  Continue amlodipine 10 mg daily. Check echocardiogram and event monitor for irregular heartbeat.  Arranged for remote patient monitoring for hypertension through pur pharmacist Manuela Schwartz.   Follow-up in 4 weeks   Thank you for referring the patient to [place]. Please feel free to contact with any questions.  Nigel Mormon, MD

## 2019-10-29 ENCOUNTER — Other Ambulatory Visit: Payer: Medicare HMO

## 2019-11-07 ENCOUNTER — Ambulatory Visit: Payer: Medicare HMO

## 2019-11-07 ENCOUNTER — Telehealth: Payer: Self-pay

## 2019-11-07 ENCOUNTER — Other Ambulatory Visit: Payer: Self-pay

## 2019-11-07 DIAGNOSIS — I10 Essential (primary) hypertension: Secondary | ICD-10-CM

## 2019-11-07 NOTE — Telephone Encounter (Signed)
Pt did not get a monitor today due that one of the regulations was not being able to swim, pt get not agree due to her swimming everyday.

## 2019-11-11 ENCOUNTER — Ambulatory Visit: Payer: Medicare HMO | Admitting: Cardiology

## 2019-11-28 ENCOUNTER — Encounter: Payer: Self-pay | Admitting: Cardiology

## 2019-11-28 ENCOUNTER — Ambulatory Visit: Payer: Medicare HMO

## 2019-11-28 ENCOUNTER — Other Ambulatory Visit: Payer: Self-pay

## 2019-11-28 ENCOUNTER — Ambulatory Visit: Payer: Medicare HMO | Admitting: Cardiology

## 2019-11-28 VITALS — BP 141/82 | HR 55 | Resp 16 | Ht 66.0 in | Wt 245.4 lb

## 2019-11-28 DIAGNOSIS — I272 Pulmonary hypertension, unspecified: Secondary | ICD-10-CM | POA: Insufficient documentation

## 2019-11-28 DIAGNOSIS — I499 Cardiac arrhythmia, unspecified: Secondary | ICD-10-CM

## 2019-11-28 DIAGNOSIS — I10 Essential (primary) hypertension: Secondary | ICD-10-CM

## 2019-11-28 DIAGNOSIS — R0609 Other forms of dyspnea: Secondary | ICD-10-CM

## 2019-11-28 DIAGNOSIS — R0600 Dyspnea, unspecified: Secondary | ICD-10-CM

## 2019-11-28 MED ORDER — LOSARTAN POTASSIUM-HCTZ 100-25 MG PO TABS: 1.0000 | ORAL_TABLET | Freq: Every day | ORAL | 2 refills | Status: DC

## 2019-11-28 NOTE — Progress Notes (Signed)
Patient referred by Bartholome Bill, MD for dizziness, irregular heart beat  Subjective:   Kathryn Olsen, female    DOB: Mar 28, 1950, 69 y.o.   MRN: [id number]   Chief Complaint  Patient presents with  . Dizziness  . Follow-up    4 week    HPI  69 y.o. Caucasain female with hypertension, OSA, COPD, referred for evaluation of dizziness, irregular heart beat  Patient's blood pressure has improved sensation of losartan-hydrochlorothiazide.  However, she has gained several pounds in last few days.  She also reports leg swelling.  She continues to have dizziness symptoms.  Initial consultation HPI 10/2019: Patient reports generalized fatigue, dizziness, headache. She also reports exertional dyspnea, without any overt orthopnea, PND, leg edema. She reports upper back pain, unrelated to exertion.  She also notices episodes of irregular heartbeat lasting for few seconds, usually not associated with any other symptoms.  Past Medical History:  Diagnosis Date  . Asthma   . COPD (chronic obstructive pulmonary disease) ([place])    questionable  . Degeneration of intervertebral disc, site unspecified   . Depression   . DJD (degenerative joint disease)   . Dyslipidemia   . GERD (gastroesophageal reflux disease)   . Pneumonia 2/15  . Shortness of breath    on exertion  . Sleep apnea    stopbang=4  . Unspecified essential hypertension      Past Surgical History:  Procedure Laterality Date  . APPENDECTOMY    . ECTOPIC PREGNANCY SURGERY    . KNEE ARTHROSCOPY  2010    Right knee  . KNEE ARTHROSCOPY  2012   Left knee  . TOTAL KNEE ARTHROPLASTY  11/22/2011   Procedure: TOTAL KNEE ARTHROPLASTY;  Surgeon: Magnus Sinning, MD;  Location: [facility];  Service: Orthopedics;  Laterality: Left;  . TOTAL KNEE REVISION Left 09/23/2013   Procedure: REVISION LEFT TOTAL KNEE WITH POLY EXCHANGE UPSIZE AND PATELLA REVISION;  Surgeon: Mauri Pole, MD;  Location: [facility];  Service:  Orthopedics;  Laterality: Left;     Social History   Tobacco Use  Smoking Status Passive Smoke Exposure - Never Smoker  Smokeless Tobacco Never Used  Tobacco Comment   both parents smoked    Social History   Substance and Sexual Activity  Alcohol Use Yes  . Alcohol/week: 1.0 standard drink  . Types: 1 Glasses of wine per week   Comment: occasional glass of wine     Family History  Problem Relation Age of Onset  . Lung cancer Father   . COPD Mother   . Hypertension Mother   . Melanoma Brother   . Alcohol abuse Brother      Current Outpatient Medications on File Prior to Visit  Medication Sig Dispense Refill  . albuterol (PROVENTIL HFA;VENTOLIN HFA) 108 (90 Base) MCG/ACT inhaler Inhale 1-2 puffs into the lungs every 6 (six) hours as needed for wheezing or shortness of breath. (Patient not taking: Reported on 10/14/2019) 1 Inhaler 6  . amLODipine (NORVASC) 5 MG tablet Take 1 tablet (5 mg total) by mouth daily. NEEDS APPOINTMENT FOR FUTURE REFILLS (Patient taking differently: Take 10 mg by mouth daily. ) 30 tablet 0  . aspirin 81 MG tablet Take 81 mg by mouth daily.    [name] Kitchen atorvastatin (LIPITOR) 10 MG tablet Take 10 mg by mouth daily.    . budesonide-formoterol (SYMBICORT) 80-4.5 MCG/ACT inhaler Inhale 2 puffs into the lungs 2 (two) times daily. 2 Inhaler 0  . calcium-vitamin D 250-100 MG-UNIT  per tablet Take 1 tablet by mouth daily.    [name] Kitchen losartan-hydrochlorothiazide (HYZAAR) 50-12.5 MG tablet Take 1 tablet by mouth daily. 30 tablet 2  . meloxicam (MOBIC) 15 MG tablet Take 15 mg by mouth at bedtime.     . Multiple Vitamin (MULTIVITAMIN) capsule Take 1 capsule by mouth daily.     Earney Navy Bicarbonate (ZEGERID OTC) 20-1100 MG CAPS capsule Take 1 capsule by mouth every evening.  (Patient not taking: Reported on 10/14/2019)    . traMADol (ULTRAM) 50 MG tablet Take 50 mg by mouth every 6 (six) hours as needed for moderate pain or severe pain. *Takes one tablet at bedtime      No current facility-administered medications on file prior to visit.    Cardiovascular and other pertinent studies:  Echocardiogram 11/07/2019:  Left ventricle cavity is normal in size. Moderate concentric hypertrophy  of the left ventricle. Normal global wall motion. Normal LV systolic  function with EF 55%. Indeterminate diastolic filling pattern.  Left atrial cavity is severely dilated.  Right atrial cavity is mildly dilated.  Trileaflet aortic valve. Trace aortic regurgitation.  Mild (Grade I) mitral regurgitation.  Moderate tricuspid regurgitation. Estimated pulmonary artery systolic  pressure 38 mmHg.   EKG 10/14/2019: Sinus rhythm 81 bpm  Borderline left atrial enlargement Incomplete right bundle branch block Occasional PAC   Recent labs: 10/07/2019: Glucose 105, BUN/Cr 18/0.68. EGFR 88. Na/K 142/4.4. Rest of the CMP normal H/H 16/50. MCV 88. Platelets 247 HbA1C N/A Chol 187, TG 159, HDL 53, LDL 102 TSH 2.7 normal    Review of Systems  Constitutional: Positive for malaise/fatigue.  Cardiovascular: Positive for dyspnea on exertion and irregular heartbeat. Negative for chest pain, leg swelling, palpitations and syncope.  Neurological: Positive for dizziness.         Vitals:   11/28/19 1156  BP: (!) 141/82  Pulse: (!) 55  Resp: 16  SpO2: 97%     Body mass index is 39.61 kg/m. Filed Weights   11/28/19 1156  Weight: 245 lb 6.4 oz (111.3 kg)     Objective:   Physical Exam Vitals and nursing note reviewed.  Constitutional:      General: She is not in acute distress. Neck:     Vascular: No JVD.  Cardiovascular:     Rate and Rhythm: Normal rate and regular rhythm. Occasional extrasystoles are present.    Pulses: Normal pulses.     Heart sounds: Normal heart sounds. No murmur heard.   Pulmonary:     Effort: Pulmonary effort is normal.     Breath sounds: Normal breath sounds. No wheezing or rales.  Musculoskeletal:     Right lower leg: Edema  (1+) present.     Left lower leg: Edema (1+) present.         Assessment & Recommendations:   69 y.o. 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  Recommend management of hypertension.  Given her leg edema, stopped her amlodipine, and instead increase losartan/hydrochlorothiazide to 100-25 mg daily.  Given her irregular heartbeat and severely dilated left atrium, I will obtain 2 week cardiac telemetry to rule out atrial fibrillation.  Check BMP, follow-up in 4 to 6 weeks.   Nigel Mormon, MD Pager: [phone] Office: [phone]

## 2019-12-02 ENCOUNTER — Other Ambulatory Visit: Payer: Self-pay

## 2019-12-02 ENCOUNTER — Ambulatory Visit: Payer: Medicare HMO | Admitting: Cardiology

## 2019-12-02 ENCOUNTER — Other Ambulatory Visit (HOSPITAL_COMMUNITY): Payer: Self-pay | Admitting: Nurse Practitioner

## 2019-12-02 ENCOUNTER — Ambulatory Visit (HOSPITAL_COMMUNITY)
Admission: RE | Admit: 2019-12-02 | Discharge: 2019-12-02 | Disposition: A | Discharge status: Home / Self Care | Payer: Medicare HMO | Source: Ambulatory Visit | Attending: Nurse Practitioner | Admitting: Nurse Practitioner

## 2019-12-02 DIAGNOSIS — R5383 Other fatigue: Secondary | ICD-10-CM | POA: Diagnosis present

## 2019-12-06 ENCOUNTER — Other Ambulatory Visit: Payer: Self-pay | Admitting: Nurse Practitioner

## 2019-12-06 ENCOUNTER — Other Ambulatory Visit (HOSPITAL_COMMUNITY): Payer: Self-pay | Admitting: Nurse Practitioner

## 2019-12-06 DIAGNOSIS — R718 Other abnormality of red blood cells: Secondary | ICD-10-CM

## 2019-12-06 DIAGNOSIS — R61 Generalized hyperhidrosis: Secondary | ICD-10-CM

## 2019-12-06 DIAGNOSIS — R5383 Other fatigue: Secondary | ICD-10-CM

## 2019-12-10 ENCOUNTER — Ambulatory Visit (HOSPITAL_COMMUNITY)
Admission: RE | Admit: 2019-12-10 | Discharge: 2019-12-10 | Disposition: A | Discharge status: Home / Self Care | Payer: Medicare HMO | Source: Ambulatory Visit | Attending: Nurse Practitioner | Admitting: Nurse Practitioner

## 2019-12-10 ENCOUNTER — Other Ambulatory Visit: Payer: Self-pay

## 2019-12-10 DIAGNOSIS — R718 Other abnormality of red blood cells: Secondary | ICD-10-CM | POA: Insufficient documentation

## 2019-12-10 DIAGNOSIS — I700 Atherosclerosis of aorta: Secondary | ICD-10-CM | POA: Insufficient documentation

## 2019-12-10 DIAGNOSIS — K449 Diaphragmatic hernia without obstruction or gangrene: Secondary | ICD-10-CM | POA: Insufficient documentation

## 2019-12-10 DIAGNOSIS — R61 Generalized hyperhidrosis: Secondary | ICD-10-CM | POA: Insufficient documentation

## 2019-12-10 DIAGNOSIS — R5383 Other fatigue: Secondary | ICD-10-CM | POA: Insufficient documentation

## 2019-12-10 LAB — POCT I-STAT CREATININE: Creatinine, Ser: 0.8 mg/dL (ref 0.44–1.00)

## 2019-12-10 MED ORDER — IOHEXOL 300 MG/ML  SOLN
100.0000 mL | Freq: Once | INTRAMUSCULAR | Status: AC | PRN
  Administered 2019-12-10: 100 mL via INTRAVENOUS

## 2019-12-17 NOTE — Progress Notes (Addendum)
Patient referred by Berkley Harvey, NP for dizziness, irregular heart beat  Subjective:   Kathryn Olsen, female    DOB: 1950-12-28, 69 y.o.   MRN: [id number]   Chief Complaint  Patient presents with  . Hypertension  . Irregular Heart Beat  . Follow-up    4-6 weeks  . Results    monitor    HPI  69 y.o. Caucasain female with hypertension, OSA, COPD, referred for evaluation of dizziness, irregular heart beat  Currently on losartan-HCTZ, and amlodipine. Avg BP is 130/90 mmHg, HR 83/min. She was recently diagnosed to have hiatal hernia, which could be etiology for her shortness of breath. She was put back on amlodipine 5 mg by PCP. BP elevated today, she attributes to stressors at home.   Initial consultation HPI 10/2019: Patient reports generalized fatigue, dizziness, headache. She also reports exertional dyspnea, without any overt orthopnea, PND, leg edema. She reports upper back pain, unrelated to exertion.  She also notices episodes of irregular heartbeat lasting for few seconds, usually not associated with any other symptoms.  Current Outpatient Medications on File Prior to Visit  Medication Sig Dispense Refill  . albuterol (PROVENTIL HFA;VENTOLIN HFA) 108 (90 Base) MCG/ACT inhaler Inhale 1-2 puffs into the lungs every 6 (six) hours as needed for wheezing or shortness of breath. 1 Inhaler 6  . aspirin 81 MG tablet Take 81 mg by mouth daily.    [name] Kitchen atorvastatin (LIPITOR) 10 MG tablet Take 10 mg by mouth daily.    . budesonide-formoterol (SYMBICORT) 80-4.5 MCG/ACT inhaler Inhale 2 puffs into the lungs 2 (two) times daily. 2 Inhaler 0  . calcium-vitamin D 250-100 MG-UNIT per tablet Take 1 tablet by mouth daily.    [name] Kitchen losartan-hydrochlorothiazide (HYZAAR) 100-25 MG tablet Take 1 tablet by mouth daily. 30 tablet 2  . meloxicam (MOBIC) 15 MG tablet Take 15 mg by mouth at bedtime.     . Multiple Vitamin (MULTIVITAMIN) capsule Take 1 capsule by mouth daily.     Earney Navy  Bicarbonate (ZEGERID OTC) 20-1100 MG CAPS capsule Take 1 capsule by mouth every evening.     . traMADol (ULTRAM) 50 MG tablet Take 50 mg by mouth every 6 (six) hours as needed for moderate pain or severe pain. *Takes one tablet at bedtime     No current facility-administered medications on file prior to visit.    Cardiovascular and other pertinent studies:  Long term monitor 11 days 11/28/2019 - 12/09/2019: Dominant rhythm: Sinus. HR 35-222 bpm. Avg HR 83 bpm. 595 episodes of SVT, fastest at 147 bpm for 4 min 3 sec, longest for 30 min 51 sec at 125 bpm. 6.3% SVE burden. 1 episode of ventricular tachycardia for 6 beats at 184 bpm <1% VE burden. 1 episode of asymptomatic 3.1 sec sinus pause at 2:01 PM. No atrial fibrillation/atrial flutter/high grade AV block, 1 patient triggered event, correlates with isolated SVE.   Echocardiogram 11/07/2019:  Left ventricle cavity is normal in size. Moderate concentric hypertrophy  of the left ventricle. Normal global wall motion. Normal LV systolic  function with EF 55%. Indeterminate diastolic filling pattern.  Left atrial cavity is severely dilated.  Right atrial cavity is mildly dilated.  Trileaflet aortic valve. Trace aortic regurgitation.  Mild (Grade I) mitral regurgitation.  Moderate tricuspid regurgitation. Estimated pulmonary artery systolic  pressure 38 mmHg.   EKG 10/14/2019: Sinus rhythm 81 bpm  Borderline left atrial enlargement Incomplete right bundle branch block Occasional PAC   Recent labs: 10/07/2019: Glucose 105,  BUN/Cr 18/0.68. EGFR 88. Na/K 142/4.4. Rest of the CMP normal H/H 16/50. MCV 88. Platelets 247 HbA1C N/A Chol 187, TG 159, HDL 53, LDL 102 TSH 2.7 normal    Review of Systems  Constitutional: Positive for malaise/fatigue.  Cardiovascular: Positive for dyspnea on exertion and irregular heartbeat. Negative for chest pain, leg swelling, palpitations and syncope.  Neurological: Positive for dizziness.          Vitals:   12/18/19 1114 12/18/19 1117  BP: (!) 153/99 (!) 154/102  Pulse: 85 86  Resp: 16   SpO2: 96%      Body mass index is 39.06 kg/m. Filed Weights   12/18/19 1114  Weight: 242 lb (109.8 kg)     Objective:   Physical Exam Vitals and nursing note reviewed.  Constitutional:      General: She is not in acute distress. Neck:     Vascular: No JVD.  Cardiovascular:     Rate and Rhythm: Normal rate and regular rhythm. Occasional extrasystoles are present.    Pulses: Normal pulses.     Heart sounds: Normal heart sounds. No murmur heard.   Pulmonary:     Effort: Pulmonary effort is normal.     Breath sounds: Normal breath sounds. No wheezing or rales.  Musculoskeletal:     Right lower leg: Edema (1+) present.     Left lower leg: Edema (1+) present.         Assessment & Recommendations:   68 y.o. Caucasain female with hypertension, OSA, COPD, referred for evaluation of dizziness, irregular heart beat  Exertional dyspnea: Likely multifactorial, including hiatal hernia, hypertension, COPD  SVT: Multiple episodes.CLinically, they have improved, according to the patient. I wonder if they are in any way related to large hiatal hernia causing irritation. Given her 3.1 sec asymptomatic pause, not starting scheduled AV nodal blocker. Discussed vagal maneuvers. Prescribed diltiazem 30 mg q8 hr prn, if palpitations episodes not improved with vagal manuevers.    Hypertension: Reasonably well controlled.  Pre-op risk stratification: Low cardiac risk for hiatal hernia  F/u in 3 months May repeat cardiac telemetry at that time   Nigel Mormon, MD Pager: [phone] Office: [phone]

## 2019-12-18 ENCOUNTER — Other Ambulatory Visit: Payer: Self-pay | Admitting: Pharmacist

## 2019-12-18 ENCOUNTER — Telehealth: Payer: Self-pay

## 2019-12-18 ENCOUNTER — Encounter: Payer: Self-pay | Admitting: Cardiology

## 2019-12-18 ENCOUNTER — Other Ambulatory Visit: Payer: Self-pay

## 2019-12-18 ENCOUNTER — Ambulatory Visit: Payer: Medicare HMO | Admitting: Cardiology

## 2019-12-18 VITALS — BP 154/102 | HR 86 | Resp 16 | Ht 66.0 in | Wt 242.0 lb

## 2019-12-18 DIAGNOSIS — I471 Supraventricular tachycardia, unspecified: Secondary | ICD-10-CM

## 2019-12-18 DIAGNOSIS — I10 Essential (primary) hypertension: Secondary | ICD-10-CM

## 2019-12-18 DIAGNOSIS — I455 Other specified heart block: Secondary | ICD-10-CM

## 2019-12-19 ENCOUNTER — Other Ambulatory Visit: Payer: Self-pay | Admitting: Cardiology

## 2019-12-19 DIAGNOSIS — I471 Supraventricular tachycardia: Secondary | ICD-10-CM | POA: Insufficient documentation

## 2019-12-19 DIAGNOSIS — I455 Other specified heart block: Secondary | ICD-10-CM | POA: Insufficient documentation

## 2019-12-19 MED ORDER — DILTIAZEM HCL 30 MG PO TABS: 30.0000 mg | ORAL_TABLET | Freq: Three times a day (TID) | ORAL | 2 refills | Status: DC | PRN

## 2019-12-19 NOTE — Addendum Note (Signed)
Addended by: Elder Negus on: 12/19/2019 02:01 PM   Modules accepted: Orders

## 2019-12-30 ENCOUNTER — Other Ambulatory Visit: Payer: Self-pay | Admitting: Surgery

## 2019-12-30 DIAGNOSIS — K449 Diaphragmatic hernia without obstruction or gangrene: Secondary | ICD-10-CM

## 2020-01-09 ENCOUNTER — Other Ambulatory Visit: Payer: Self-pay | Admitting: Cardiology

## 2020-01-09 DIAGNOSIS — I10 Essential (primary) hypertension: Secondary | ICD-10-CM

## 2020-01-10 ENCOUNTER — Other Ambulatory Visit: Payer: Self-pay | Admitting: Surgery

## 2020-01-10 ENCOUNTER — Other Ambulatory Visit (HOSPITAL_COMMUNITY): Payer: Medicare HMO

## 2020-01-10 DIAGNOSIS — K922 Gastrointestinal hemorrhage, unspecified: Secondary | ICD-10-CM

## 2020-01-14 ENCOUNTER — Encounter (HOSPITAL_COMMUNITY): Admission: RE | Payer: Self-pay | Source: Home / Self Care

## 2020-01-14 ENCOUNTER — Ambulatory Visit (HOSPITAL_COMMUNITY): Admission: RE | Admit: 2020-01-14 | Payer: Medicare HMO | Source: Home / Self Care | Admitting: Surgery

## 2020-01-14 SURGERY — COLONOSCOPY
Anesthesia: Monitor Anesthesia Care

## 2020-01-15 ENCOUNTER — Ambulatory Visit
Admission: RE | Admit: 2020-01-15 | Discharge: 2020-01-15 | Disposition: A | Discharge status: Home / Self Care | Payer: Medicare HMO | Source: Ambulatory Visit | Attending: Surgery | Admitting: Surgery

## 2020-01-15 ENCOUNTER — Other Ambulatory Visit: Payer: Self-pay | Admitting: Cardiology

## 2020-01-15 ENCOUNTER — Other Ambulatory Visit: Payer: Medicare HMO

## 2020-01-15 DIAGNOSIS — K922 Gastrointestinal hemorrhage, unspecified: Secondary | ICD-10-CM

## 2020-01-15 DIAGNOSIS — I471 Supraventricular tachycardia: Secondary | ICD-10-CM

## 2020-01-22 ENCOUNTER — Ambulatory Visit
Admission: RE | Admit: 2020-01-22 | Discharge: 2020-01-22 | Disposition: A | Discharge status: Home / Self Care | Payer: Medicare HMO | Source: Ambulatory Visit | Attending: Surgery | Admitting: Surgery

## 2020-01-22 DIAGNOSIS — K449 Diaphragmatic hernia without obstruction or gangrene: Secondary | ICD-10-CM

## 2020-02-05 ENCOUNTER — Emergency Department (HOSPITAL_COMMUNITY)
Admission: EM | Admit: 2020-02-05 | Discharge: 2020-02-05 | Disposition: A | Discharge status: Home / Self Care | Payer: Medicare HMO | Attending: Emergency Medicine | Admitting: Emergency Medicine

## 2020-02-05 ENCOUNTER — Encounter (HOSPITAL_COMMUNITY): Payer: Self-pay | Admitting: Emergency Medicine

## 2020-02-05 ENCOUNTER — Emergency Department (HOSPITAL_COMMUNITY): Payer: Medicare HMO

## 2020-02-05 ENCOUNTER — Other Ambulatory Visit: Payer: Self-pay

## 2020-02-05 DIAGNOSIS — Z96652 Presence of left artificial knee joint: Secondary | ICD-10-CM | POA: Diagnosis not present

## 2020-02-05 DIAGNOSIS — I10 Essential (primary) hypertension: Secondary | ICD-10-CM | POA: Insufficient documentation

## 2020-02-05 DIAGNOSIS — K449 Diaphragmatic hernia without obstruction or gangrene: Secondary | ICD-10-CM | POA: Diagnosis not present

## 2020-02-05 DIAGNOSIS — Z7722 Contact with and (suspected) exposure to environmental tobacco smoke (acute) (chronic): Secondary | ICD-10-CM | POA: Diagnosis not present

## 2020-02-05 DIAGNOSIS — Z79899 Other long term (current) drug therapy: Secondary | ICD-10-CM | POA: Insufficient documentation

## 2020-02-05 DIAGNOSIS — J449 Chronic obstructive pulmonary disease, unspecified: Secondary | ICD-10-CM | POA: Insufficient documentation

## 2020-02-05 DIAGNOSIS — J45909 Unspecified asthma, uncomplicated: Secondary | ICD-10-CM | POA: Diagnosis not present

## 2020-02-05 DIAGNOSIS — R002 Palpitations: Secondary | ICD-10-CM

## 2020-02-05 DIAGNOSIS — Z7982 Long term (current) use of aspirin: Secondary | ICD-10-CM | POA: Insufficient documentation

## 2020-02-05 LAB — CBC WITH DIFFERENTIAL/PLATELET
Abs Immature Granulocytes: 0.04 10*3/uL (ref 0.00–0.07)
Basophils Absolute: 0.1 10*3/uL (ref 0.0–0.1)
Basophils Relative: 1 %
Eosinophils Absolute: 0.3 10*3/uL (ref 0.0–0.5)
Eosinophils Relative: 3 %
HCT: 50.2 % — ABNORMAL HIGH (ref 36.0–46.0)
Hemoglobin: 16 g/dL — ABNORMAL HIGH (ref 12.0–15.0)
Immature Granulocytes: 0 %
Lymphocytes Relative: 20 %
Lymphs Abs: 2.2 10*3/uL (ref 0.7–4.0)
MCH: 29.2 pg (ref 26.0–34.0)
MCHC: 31.9 g/dL (ref 30.0–36.0)
MCV: 91.6 fL (ref 80.0–100.0)
Monocytes Absolute: 1 10*3/uL (ref 0.1–1.0)
Monocytes Relative: 9 %
Neutro Abs: 7.4 10*3/uL (ref 1.7–7.7)
Neutrophils Relative %: 67 %
Platelets: 308 10*3/uL (ref 150–400)
RBC: 5.48 MIL/uL — ABNORMAL HIGH (ref 3.87–5.11)
RDW: 13 % (ref 11.5–15.5)
WBC: 10.9 10*3/uL — ABNORMAL HIGH (ref 4.0–10.5)
nRBC: 0 % (ref 0.0–0.2)

## 2020-02-05 LAB — COMPREHENSIVE METABOLIC PANEL
ALT: 22 U/L (ref 0–44)
AST: 26 U/L (ref 15–41)
Albumin: 4.2 g/dL (ref 3.5–5.0)
Alkaline Phosphatase: 76 U/L (ref 38–126)
Anion gap: 11 (ref 5–15)
BUN: 23 mg/dL (ref 8–23)
CO2: 25 mmol/L (ref 22–32)
Calcium: 9.3 mg/dL (ref 8.9–10.3)
Chloride: 107 mmol/L (ref 98–111)
Creatinine, Ser: 0.97 mg/dL (ref 0.44–1.00)
GFR, Estimated: >60 mL/min (ref >60)
Glucose, Bld: 102 mg/dL — ABNORMAL HIGH (ref 70–99)
Potassium: 3.8 mmol/L (ref 3.5–5.1)
Sodium: 143 mmol/L (ref 135–145)
Total Bilirubin: 0.7 mg/dL (ref 0.3–1.2)
Total Protein: 7.2 g/dL (ref 6.5–8.1)

## 2020-02-05 LAB — TROPONIN I (HIGH SENSITIVITY)
Troponin I (High Sensitivity): 7 ng/L (ref <18)
Troponin I (High Sensitivity): 9 ng/L (ref <18)

## 2020-02-05 LAB — LIPASE, BLOOD: Lipase: 34 U/L (ref 11–51)

## 2020-02-05 NOTE — ED Triage Notes (Signed)
Pt states that she has not been feeling well since waking this morning.  Pt states she had a racing hear rate with nausea and diaphoresis at 1130 and she took a cardizem 30mg .  Pt states she has a hiatal hernia that will require surgical intervention due to stomach encapsulation.

## 2020-02-05 NOTE — ED Provider Notes (Signed)
Kathryn Olsen   CSN: [id number] Arrival date & time: 02/05/20  1207     History Chief Complaint  Patient presents with  . Abdominal Pain    Kathryn Olsen is a 69 y.o. female with PMH of COPD, HLD, OSA, asthma, and GERD who presents to the ED with complaints of palpitations, nausea, and diaphoresis.  On my examination, patient reports that at approximately 11 AM she developed acute onset palpitations, lightheadedness, nausea, and feeling sweaty.  She also notes feeling "pressure" in her upper abdomen.  She states that she felt as though she might pass out which prompted her to sit down.  By the time I evaluated the patient, she was feeling improved.  She states that she had taken her diltiazem 30 mg which seemed to help.  She tells me that she has a very large hiatal hernia which she has been told in the past may contribute to her symptoms.  I reviewed patient's past medical record and she is followed by Dr. Rosemary Holms with [facility] cardiology.  He last evaluated patient 12/18/2019 and she was noted to have exertional dyspnea that is multifactorial in addition to multiple episodes of SVT with asymptomatic pauses up to 3.1 seconds.  She was not started on AV nodal blocker, but instead was prescribed diltiazem 30 mg to take every 8 hours as needed if palpitations did not improve with vagal maneuvers.  She states that she was baking a cake at the time of her onset of symptoms, but states that it felt more profound than her prior episodes of SVT.  She denied any room spinning dizziness, but did feel as though she may pass out.  She also denied any overt chest pain or shortness of breath, emesis, recent illness or infection, fevers or chills, recent unilateral extremity swelling or edema, or other symptoms.    I reviewed patient's medical record and echocardiogram obtained 11/07/2019 showed moderate pulmonary hypertension in addition to severely dilated left  atrium.  HPI     Past Medical History:  Diagnosis Date  . Asthma   . COPD (chronic obstructive pulmonary disease) (HCC)    questionable  . Degeneration of intervertebral disc, site unspecified   . Depression   . DJD (degenerative joint disease)   . Dyslipidemia   . GERD (gastroesophageal reflux disease)   . Pneumonia 2/15  . Shortness of breath    on exertion  . Sleep apnea    stopbang=4  . Unspecified essential hypertension     Patient Active Problem List   Diagnosis Date Noted  . PSVT (paroxysmal supraventricular tachycardia) (HCC) 12/19/2019  . Sinus pause 12/19/2019  . Pulmonary hypertension, unspecified (HCC) 11/28/2019  . Irregular heart beat 10/14/2019  . Dizziness 10/14/2019  . Asthma, chronic 11/29/2013  . Other malaise and fatigue 11/29/2013  . Shortness of breath 10/28/2013  . Obesity, unspecified 10/28/2013  . S/P left knee revision 09/23/2013  . Exertional dyspnea 08/09/2013  . Preoperative cardiovascular examination 08/09/2013  . Pre-operative cardiovascular examination 08/09/2013  . Chest pain 08/09/2013  . Dyspnea 08/09/2013  . Essential hypertension 08/09/2013  . Nonspecific (abnormal) findings on radiological and other examination of gastrointestinal tract 04/26/2011  . Esophageal reflux 04/26/2011  . Chest pain, unspecified 04/26/2011  . Special screening for malignant neoplasms, colon 04/26/2011  . DYSLIPIDEMIA 09/03/2008  . Malignant hypertension 09/03/2008  . DEGENERATIVE DISC DISEASE 09/03/2008    Past Surgical History:  Procedure Laterality Date  . APPENDECTOMY    . ECTOPIC PREGNANCY  SURGERY    . KNEE ARTHROSCOPY  2010    Right knee  . KNEE ARTHROSCOPY  2012   Left knee  . 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 TOTAL KNEE REVISION Left 09/23/2013   Procedure: REVISION LEFT TOTAL KNEE WITH POLY EXCHANGE  UPSIZE AND PATELLA REVISION;  Surgeon: Shelda Pal, MD;  Location: [facility];  Service: Orthopedics;  Laterality: Left;     OB History   No obstetric history on file.     Family History  Problem Relation Age of Onset  . Lung cancer Father   . COPD Mother   . Hypertension Mother   . Melanoma Brother   . Alcohol abuse Brother     Social History   Tobacco Use  . Smoking status: Passive Smoke Exposure - Never Smoker  . Smokeless tobacco: Never Used  . Tobacco comment: both parents smoked  Vaping Use  . Vaping Use: Never used  Substance Use Topics  . Alcohol use: Yes    Alcohol/week: 1.0 standard drink    Types: 1 Glasses of wine per week    Comment: occasional glass of wine  . Drug use: No    Home Medications Prior to Admission medications   Medication Sig Start Date End Date Taking? Authorizing Provider  albuterol (PROVENTIL HFA;VENTOLIN HFA) 108 (90 Base) MCG/ACT inhaler Inhale 1-2 puffs into the lungs every 6 (six) hours as needed for wheezing or shortness of breath. 11/05/15  Yes McQuaid, Brooke Pace, MD  amLODipine (NORVASC) 5 MG tablet Take 5 mg by mouth daily. 12/02/19  Yes [provider]  aspirin EC 81 MG tablet Take 81 mg by mouth daily. Swallow whole.   Yes [provider]  atorvastatin (LIPITOR) 10 MG tablet Take 10 mg by mouth daily. 11/28/17  Yes [provider]  budesonide-formoterol (SYMBICORT) 80-4.5 MCG/ACT inhaler Inhale 2 puffs into the lungs 2 (two) times daily. 11/05/15  Yes Lupita Leash, MD  diltiazem (CARDIZEM) 30 MG tablet TAKE 1 TABLET(30 MG) BY MOUTH EVERY 8 HOURS AS NEEDED Patient taking differently: Take 30 mg by mouth 3 (three) times daily as needed. Blood pressure 01/15/20  Yes Patwardhan, Manish J, MD  furosemide (LASIX) 20 MG tablet Take 20 mg by mouth daily.  12/02/19  Yes [provider]  hydrOXYzine (ATARAX/VISTARIL) 10 MG tablet Take 10 mg by mouth daily as needed for anxiety or sleep. 12/02/19  Yes  [provider]  losartan-hydrochlorothiazide (HYZAAR) 100-25 MG tablet Take 1 tablet by mouth daily. 11/28/19  Yes Patwardhan, Manish J, MD  Magnesium 100 MG CAPS Take 1 capsule by mouth daily.   Yes [provider]  meloxicam (MOBIC) 15 MG tablet Take 15 mg by mouth at bedtime.    Yes [provider]  Multiple Vitamin (MULTIVITAMIN WITH MINERALS) TABS tablet Take 1 tablet by mouth daily.   Yes [provider]  omeprazole (PRILOSEC) 20 MG capsule Take 20 mg by mouth daily.   Yes [provider]    Allergies    Buprenorphine hcl and Ace inhibitors  Review of Systems   Review of Systems  All other systems reviewed and are negative.   Physical Exam Updated Vital Signs BP 139/89   Pulse 73   Temp 98 F (36.7 C) (Oral)   Resp 14   Ht 5\' 7"  (1.702 m)   Wt 110.2  kg   SpO2 100%   BMI 38.06 kg/m   Physical Exam Vitals and nursing Olsen reviewed. Exam conducted with a chaperone present.  Constitutional:      General: She is not in acute distress.    Appearance: Normal appearance. She is not ill-appearing.  HENT:     Head: Normocephalic and atraumatic.  Eyes:     General: No scleral icterus.    Extraocular Movements: Extraocular movements intact.     Conjunctiva/sclera: Conjunctivae normal.     Pupils: Pupils are equal, round, and reactive to light.  Cardiovascular:     Rate and Rhythm: Normal rate. Rhythm irregular.     Pulses: Normal pulses.     Heart sounds: Normal heart sounds.  Pulmonary:     Effort: Pulmonary effort is normal. No respiratory distress.     Breath sounds: Normal breath sounds. No wheezing or rales.  Abdominal:     Comments: Soft, nondistended.  Mild TTP in epigastrium.  No guarding.  No TTP elsewhere.  No overlying skin changes.  No masses appreciated.  Musculoskeletal:        General: Normal range of motion.     Cervical back: Normal range of motion. No rigidity.  Skin:    General: Skin is dry.     Capillary  Refill: Capillary refill takes less than 2 seconds.  Neurological:     General: No focal deficit present.     Mental Status: She is alert and oriented to person, place, and time.     GCS: GCS eye subscore is 4. GCS verbal subscore is 5. GCS motor subscore is 6.     Cranial Nerves: No cranial nerve deficit.     Sensory: No sensory deficit.     Coordination: Coordination normal.     Gait: Gait normal.  Psychiatric:        Mood and Affect: Mood normal.        Behavior: Behavior normal.        Thought Content: Thought content normal.     ED Results / Procedures / Treatments   Labs (all labs ordered are listed, but only abnormal results are displayed) Labs Reviewed  CBC WITH DIFFERENTIAL/PLATELET - Abnormal; Notable for the following components:      Result Value   WBC 10.9 (*)    RBC 5.48 (*)    Hemoglobin 16.0 (*)    HCT 50.2 (*)    All other components within normal limits  COMPREHENSIVE METABOLIC PANEL - Abnormal; Notable for the following components:   Glucose, Bld 102 (*)    All other components within normal limits  LIPASE, BLOOD  TROPONIN I (HIGH SENSITIVITY)  TROPONIN I (HIGH SENSITIVITY)    EKG None  Radiology DG Chest Portable 1 View  Result Date: 02/05/2020 CLINICAL DATA:  Chest pain. EXAM: PORTABLE CHEST 1 VIEW COMPARISON:  December 12, 2019. FINDINGS: The heart size and mediastinal contours are within normal limits. Both lungs are clear. Large hiatal hernia is again noted. No pneumothorax or pleural effusion is noted. The visualized skeletal structures are unremarkable. IMPRESSION: No active disease. Large hiatal hernia. Electronically Signed   By: Lupita RaiderJames  Nikole Swartzentruber Jr M.D.   On: 02/05/2020 13:51    Procedures Procedures (including critical care time)  Medications Ordered in ED Medications - No data to display  ED Course  I have reviewed the triage vital signs and the nursing notes.  Pertinent labs & imaging results that were available during my care of the  patient  were reviewed by me and considered in my medical decision making (see chart for details).    MDM Rules/Calculators/A&P                          Patient is already being followed by gastroenterology and has planned surgical intervention for her large hiatal hernia.  However, this is the first time that she was also experiencing significant palpitations that made her feel lightheaded as though she may pass out.  It was also associated with nausea and diaphoresis.  She denies any room spinning dizziness, weakness or numbness, or other focal neurologic deficits.  Her neurologic exam is benign.  However, given that she had epigastric "pressure" discomfort along with palpitations and feeling as though she may pass out, will obtain cardiac work-up to assess for MI.  Will trend troponin x2 given that this occurred 1 hour prior to my examination.  If negative, patient can follow-up with her gastroenterologist as well as her cardiologist for ongoing evaluation and management.  Do not feel as though changes to her Cardizem 30 mg every 8 hours as needed for palpitations is necessary, particularly given that it seems to have improved her symptoms here in the ED.  She is without any complaints on my exam.  Low suspicion for AAA, dissection, or PE.  Discussed case with Dr. Estell Harpin who agrees with assessment and plan.    Labs CBC with differential: Mild leukocytosis to 10.9, no anemia. CMP: Unremarkable. Lipase: Within normal limits. Troponin: 7 >> 9  Plain films of chest are personally reviewed and demonstrate a large hiatal hernia, but no acute cardiopulmonary disease.  Patient is reassured by today's work-up.  Stable for discharge at this time with close outpatient follow-up.  Considered admission given history concerning for nerve pain in the context of her age and comorbidities, however her tachycardia has significantly improved and along with all of her symptoms.  Suspect this is related to her  documented history of SVT.  She prefer discharge home at this time and I feel as though it is not unreasonable.  Strict ED return precautions discussed.  All of the evaluation and work-up results were discussed with the patient and any family at bedside.  Patient and/or family were informed that while patient is appropriate for discharge at this time, some medical emergencies may only develop or become detectable after a period of time.  I specifically instructed patient and/or family to return to return to the ED or seek immediate medical attention for any new or worsening symptoms.  They were provided opportunity to ask any additional questions and have none at this time.  Prior to discharge patient is feeling well, agreeable with plan for discharge home.  They have expressed understanding of verbal discharge instructions as well as return precautions and are agreeable to the plan.   Final Clinical Impression(s) / ED Diagnoses Final diagnoses:  Palpitations  Hiatal hernia    Rx / DC Orders ED Discharge Orders    None       Lorelee New, PA-C 02/05/20 1757    Bethann Berkshire, MD 02/09/20 [phone]

## 2020-02-05 NOTE — Discharge Instructions (Addendum)
I am glad that your palpitations had improved after administration of your Cardizem 30 mg.  Your work-up and exam is reassuring.  However, would like for you to have close outpatient follow-up with your gastroenterologist regarding your plans for surgical intervention for your large hiatal hernia.  Perhaps this was a contributing factor. I would like for you to type into Google or YouTube "How to perform Vagal Maneuver" and try that the next time your are experiencing similar palpitations.  I would also like for you to follow-up with your cardiologist given your abnormal heart rhythm.  This is likely what caused your lightheadedness.  Please also follow-up with your primary care provider regarding today's ED encounter.  Please have a low threshold to return to the ED or seek immediate medical attention for any new or worsening symptoms.  If you pass out, you would need to come back and be admitted for evaluation.

## 2020-02-23 ENCOUNTER — Other Ambulatory Visit: Payer: Self-pay | Admitting: Cardiology

## 2020-02-23 DIAGNOSIS — I10 Essential (primary) hypertension: Secondary | ICD-10-CM

## 2020-03-16 ENCOUNTER — Encounter (HOSPITAL_COMMUNITY): Admission: RE | Admit: 2020-03-16 | Payer: Medicare HMO | Source: Ambulatory Visit

## 2020-03-20 ENCOUNTER — Inpatient Hospital Stay (HOSPITAL_COMMUNITY): Admission: RE | Admit: 2020-03-20 | Payer: Medicare HMO | Source: Ambulatory Visit

## 2020-03-23 ENCOUNTER — Encounter: Payer: Self-pay | Admitting: Emergency Medicine

## 2020-03-23 ENCOUNTER — Ambulatory Visit
Admission: EM | Admit: 2020-03-23 | Discharge: 2020-03-23 | Disposition: A | Discharge status: Home / Self Care | Payer: Medicare HMO | Attending: Emergency Medicine | Admitting: Emergency Medicine

## 2020-03-23 ENCOUNTER — Inpatient Hospital Stay: Admit: 2020-03-23 | Payer: Medicare HMO | Admitting: Surgery

## 2020-03-23 ENCOUNTER — Ambulatory Visit (INDEPENDENT_AMBULATORY_CARE_PROVIDER_SITE_OTHER): Payer: Medicare HMO

## 2020-03-23 DIAGNOSIS — S52501A Unspecified fracture of the lower end of right radius, initial encounter for closed fracture: Secondary | ICD-10-CM | POA: Diagnosis not present

## 2020-03-23 DIAGNOSIS — W19XXXA Unspecified fall, initial encounter: Secondary | ICD-10-CM

## 2020-03-23 DIAGNOSIS — S6991XA Unspecified injury of right wrist, hand and finger(s), initial encounter: Secondary | ICD-10-CM | POA: Diagnosis not present

## 2020-03-23 DIAGNOSIS — M25531 Pain in right wrist: Secondary | ICD-10-CM | POA: Diagnosis not present

## 2020-03-23 SURGERY — REPAIR, HERNIA, HIATAL, LAPAROSCOPIC
Anesthesia: General

## 2020-03-23 MED ORDER — IBUPROFEN 800 MG PO TABS: 800.0000 mg | ORAL_TABLET | Freq: Three times a day (TID) | ORAL | 0 refills | Status: DC

## 2020-03-23 NOTE — ED Provider Notes (Addendum)
[facility]   [id number] 03/23/20 Arrival Time: 1805   Chief Complaint  Patient presents with  . Wrist Pain     SUBJECTIVE: History from: patient.  Kathryn Olsen is a 70 y.o. female who presented to the urgent care with a complaint of right wrist pain 30 minutes ago. Developed the symptom after failing over her dog bed and landed on her right wrist. She localizes the pain to the right wrist. She describes the pain as constant and achy.  Has not tried any medication. Her symptoms are made worse with ROM. She denies similar symptoms in the past. Denies chills, fever, nausea, vomiting, diarrhea.     ROS: As per HPI.  All other pertinent ROS negative      Past Medical History:  Diagnosis Date  . Asthma   . COPD (chronic obstructive pulmonary disease) (HCC)    questionable  . Degeneration of intervertebral disc, site unspecified   . Depression   . DJD (degenerative joint disease)   . Dyslipidemia   . GERD (gastroesophageal reflux disease)   . Pneumonia 2/15  . Shortness of breath    on exertion  . Sleep apnea    stopbang=4  . Unspecified essential hypertension    Past Surgical History:  Procedure Laterality Date  . APPENDECTOMY    . ECTOPIC PREGNANCY SURGERY    . KNEE ARTHROSCOPY  2010    Right knee  . KNEE ARTHROSCOPY  2012   Left knee  . 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 TOTAL KNEE REVISION Left 09/23/2013   Procedure: REVISION LEFT TOTAL KNEE WITH POLY EXCHANGE UPSIZE AND PATELLA REVISION;  Surgeon: Shelda Pal, MD;  Location: [facility];  Service: Orthopedics;  Laterality: Left;   Allergies  Allergen Reactions  . Buprenorphine Hcl Anaphylaxis and Itching  . Ace Inhibitors Diarrhea   No current facility-administered medications on file prior to encounter.   Current Outpatient Medications on File Prior to  Encounter  Medication Sig Dispense Refill  . albuterol (PROVENTIL HFA;VENTOLIN HFA) 108 (90 Base) MCG/ACT inhaler Inhale 1-2 puffs into the lungs every 6 (six) hours as needed for wheezing or shortness of breath. 1 Inhaler 6  . amLODipine (NORVASC) 5 MG tablet Take 5 mg by mouth every evening.    [name] Kitchen aspirin EC 81 MG tablet Take 81 mg by mouth daily. Swallow whole.    [name] Kitchen atorvastatin (LIPITOR) 10 MG tablet Take 10 mg by mouth daily.    . budesonide-formoterol (SYMBICORT) 80-4.5 MCG/ACT inhaler Inhale 2 puffs into the lungs 2 (two) times daily. 2 Inhaler 0  . diltiazem (CARDIZEM) 30 MG tablet TAKE 1 TABLET(30 MG) BY MOUTH EVERY 8 HOURS AS NEEDED (Patient taking differently: Take 30 mg by mouth 3 (three) times daily as needed. Blood pressure) 270 tablet 1  . furosemide (LASIX) 20 MG tablet Take 20 mg by mouth daily.     . hydrOXYzine (ATARAX/VISTARIL) 10 MG tablet Take 10 mg by mouth daily as needed for anxiety or sleep.    [name] Kitchen losartan-hydrochlorothiazide (HYZAAR) 100-25 MG tablet TAKE 1 TABLET BY MOUTH DAILY 90 tablet 2  . meloxicam (MOBIC) 15 MG tablet Take 15 mg by mouth at bedtime.     . Multiple Vitamin (MULTIVITAMIN WITH MINERALS) TABS tablet Take 1 tablet by mouth daily.    [name] Kitchen omeprazole (PRILOSEC) 20 MG capsule Take 20 mg by mouth  daily.    . Potassium Gluconate 550 MG TABS Take 550 mg by mouth daily.     Social History   Socioeconomic History  . Marital status: Married    Spouse name: Not on file  . Number of children: 2  . Years of education: Not on file  . Highest education level: Not on file  Occupational History  . Occupation: Nurse    Employer: [employer]  Tobacco Use  . Smoking status: Passive Smoke Exposure - Never Smoker  . Smokeless tobacco: Never Used  . Tobacco comment: both parents smoked  Vaping Use  . Vaping Use: Never used  Substance and Sexual Activity  . Alcohol use: Yes    Alcohol/week: 1.0 standard drink    Types: 1 Glasses of wine per week    Comment:  occasional glass of wine  . Drug use: No  . Sexual activity: Not on file  Other Topics Concern  . Not on file  Social History Narrative  . Not on file   Social Determinants of Health   Financial Resource Strain: Not on file  Food Insecurity: Not on file  Transportation Needs: Not on file  Physical Activity: Not on file  Stress: Not on file  Social Connections: Not on file  Intimate Partner Violence: Not on file   Family History  Problem Relation Age of Onset  . Lung cancer Father   . COPD Mother   . Hypertension Mother   . Melanoma Brother   . Alcohol abuse Brother     OBJECTIVE:  Vitals:   03/23/20 1854 03/23/20 1906  BP: 137/79   Pulse: 88   Resp: 15   Temp: 98 F (36.7 C)   SpO2: 97%   Weight:  240 lb (108.9 kg)  Height:  5\' 5"  (1.651 m)     Physical Exam Vitals and nursing note reviewed.  Constitutional:      General: She is not in acute distress.    Appearance: Normal appearance. She is normal weight. She is not ill-appearing, toxic-appearing or diaphoretic.  HENT:     Head: Normocephalic.  Cardiovascular:     Rate and Rhythm: Normal rate and regular rhythm.     Pulses: Normal pulses.     Heart sounds: Normal heart sounds. No murmur heard. No friction rub. No gallop.   Pulmonary:     Effort: Pulmonary effort is normal. No respiratory distress.     Breath sounds: Normal breath sounds. No stridor. No wheezing, rhonchi or rales.  Chest:     Chest wall: No tenderness.  Musculoskeletal:     Right forearm: Tenderness present.     Comments: The right forearm is with obvious deformity compared to the left forearm. Tenderness and swelling present. There is no ecchymosis, open wound, lesion, warmth or surface trauma present.Limited range of motion with pain. Neurovascular status intact.  Neurological:     Mental Status: She is alert and oriented to person, place, and time.     LABS:  No results found for this or any previous visit (from the past 24  hour(s)).   RADIOLOGY  DG Forearm Right  Result Date: 03/23/2020 CLINICAL DATA:  70 year old female with fall and trauma to the right wrist. EXAM: RIGHT FOREARM - 2 VIEW COMPARISON:  None. FINDINGS: There is a comminuted fracture of the distal radius with a transverse component through the radial metaphysis and a longitudinal fracture component extending into the articular surface. The bones are osteopenic. There is no dislocation. Degenerative  changes of the base of the thumb. There is degenerative changes of the left elbow. The soft tissues are grossly unremarkable. IMPRESSION: Comminuted intra-articular fracture of the distal radius. Electronically Signed   By: Elgie Collard M.D.   On: 03/23/2020 19:13   Right forearm x-ray is positive for distal radius fracture.  I have reviewed the x-ray myself and the radiologist interpretation.  I am in agreement with the radiologist interpretation.    ASSESSMENT & PLAN:  1. Right wrist pain   2. Fall, initial encounter   3. Closed fracture of distal end of right radius, unspecified fracture morphology, initial encounter     Meds ordered this encounter  Medications  . ibuprofen (ADVIL) 800 MG tablet    Sig: Take 1 tablet (800 mg total) by mouth 3 (three) times daily.    Dispense:  30 tablet    Refill:  0    Discharge instructions  Prescribed ibuprofen 800 mg for moderate pain/take as directed for pain Continue to take tramadol for severe pain Follow-up with orthopedic Follow RICE instruction that is attached Follow-up with PCP Return or go to ED if you develop any new or worsening of his symptoms  Reviewed expectations re: course of current medical issues. Questions answered. Outlined signs and symptoms indicating need for more acute intervention. Patient verbalized understanding. After Visit Summary given.    PDMP reviewed during this encounter.      Durward Parcel, FNP 03/23/20 1940     HPI has been updated to  reflect accurate HPI.  Note was addended on 04/01/2020 at 2:14 PM   Durward Parcel, FNP 04/01/20 1415

## 2020-03-23 NOTE — Discharge Instructions (Addendum)
Prescribed ibuprofen 800 mg moderate pain/take as directed for pain Continue to take tramadol for severe pain Follow-up with orthopedic Follow RICE instruction that is attached Follow-up with PCP Return or go to ED if you develop any new or worsening of his symptoms

## 2020-03-23 NOTE — ED Triage Notes (Signed)
Fell over dog bed and landed on right wrist

## 2020-03-27 ENCOUNTER — Ambulatory Visit: Payer: Medicare HMO | Admitting: Orthopedic Surgery

## 2020-03-27 ENCOUNTER — Encounter: Payer: Self-pay | Admitting: Orthopedic Surgery

## 2020-03-27 ENCOUNTER — Other Ambulatory Visit: Payer: Self-pay

## 2020-03-27 ENCOUNTER — Ambulatory Visit: Payer: Medicare HMO

## 2020-03-27 VITALS — BP 157/102 | HR 92 | Ht 66.0 in | Wt 242.0 lb

## 2020-03-27 DIAGNOSIS — S52531A Colles' fracture of right radius, initial encounter for closed fracture: Secondary | ICD-10-CM

## 2020-03-27 DIAGNOSIS — W010XXA Fall on same level from slipping, tripping and stumbling without subsequent striking against object, initial encounter: Secondary | ICD-10-CM

## 2020-03-27 DIAGNOSIS — S92324A Nondisplaced fracture of second metatarsal bone, right foot, initial encounter for closed fracture: Secondary | ICD-10-CM

## 2020-03-27 NOTE — Progress Notes (Signed)
New Patient Visit  Assessment: Kathryn Olsen is a 70 y.o. RHD female with the following: Right distal radius fracture, stable alignment; attempt nonoperative treatment in a cast  Plan: Kathryn Olsen sustained a displaced right distal radius fracture, without obvious intra-articular involvement.  Based on XR in clinic today, this can be treated without surgery.  She was placed in a cast and we will monitor closely for worsening displacement.  Follow up in 1 week for repeat XR in a cast.  Cast application - right short arm cast   Verbal consent was obtained and the correct extremity was identified. A well padded, appropriately molded short arm cast was applied to the right arm Fingers remained warm and well perfused.   There were no sharp edges Patient tolerated the procedure well Cast care instructions were provided   Follow-up: Return in about 1 week (around 04/03/2020).  Subjective:  Chief Complaint  Patient presents with  . Wrist Pain    Patient reports that she is having some pain but more throbbing, and fingers feel sore.     History of Present Illness: Kathryn Olsen is a 70 y.o. RHD female who presents for evaluation of a right wrist injury.  She states she fell over her dog bit, and landed onto her right wrist.  She noted immediate pain and swelling.  She presented to an urgent care facility, who obtained x-rays and diagnosed with a distal radius fracture.  She was placed in a splint and referred to clinic today.  She continues to have some pain in her right wrist.  She also noted swelling in her right hand.  She denies numbness and tingling.  She notes the pain is worse at night.  She has been taking some tramadol, as well as hydrocodone as needed.   Review of Systems: No fevers or chills No numbness or tingling No chest pain No shortness of breath No bowel or bladder dysfunction No GI distress No headaches   Medical History:  Past Medical  History:  Diagnosis Date  . Asthma   . COPD (chronic obstructive pulmonary disease) (HCC)    questionable  . Degeneration of intervertebral disc, site unspecified   . Depression   . DJD (degenerative joint disease)   . Dyslipidemia   . GERD (gastroesophageal reflux disease)   . Pneumonia 2/15  . Shortness of breath    on exertion  . Sleep apnea    stopbang=4  . Unspecified essential hypertension     Past Surgical History:  Procedure Laterality Date  . APPENDECTOMY    . ECTOPIC PREGNANCY SURGERY    . KNEE ARTHROSCOPY  2010    Right knee  . KNEE ARTHROSCOPY  2012   Left knee  . 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 TOTAL KNEE REVISION Left 09/23/2013   Procedure: REVISION LEFT TOTAL KNEE WITH POLY EXCHANGE UPSIZE AND PATELLA REVISION;  Surgeon: Shelda Pal, MD;  Location: [facility];  Service: Orthopedics;  Laterality: Left;    Family History  Problem Relation Age of Onset  . Lung cancer Father   . COPD Mother   . Hypertension Mother   . Melanoma Brother   . Alcohol abuse Brother    Social History   Tobacco Use  . Smoking status: Passive Smoke Exposure - Never Smoker  . Smokeless tobacco: Never Used  . Tobacco comment: both parents  smoked  Vaping Use  . Vaping Use: Never used  Substance Use Topics  . Alcohol use: Yes    Alcohol/week: 1.0 standard drink    Types: 1 Glasses of wine per week    Comment: occasional glass of wine  . Drug use: No    Allergies  Allergen Reactions  . Buprenorphine Hcl Anaphylaxis and Itching  . Ace Inhibitors Diarrhea    Current Meds  Medication Sig  . albuterol (PROVENTIL HFA;VENTOLIN HFA) 108 (90 Base) MCG/ACT inhaler Inhale 1-2 puffs into the lungs every 6 (six) hours as needed for wheezing or shortness of breath.  [name] Kitchen amLODipine (NORVASC) 5 MG tablet Take 5 mg by mouth every evening.  [name] Kitchen aspirin EC  81 MG tablet Take 81 mg by mouth daily. Swallow whole.  [name] Kitchen atorvastatin (LIPITOR) 10 MG tablet Take 10 mg by mouth daily.  . budesonide-formoterol (SYMBICORT) 80-4.5 MCG/ACT inhaler Inhale 2 puffs into the lungs 2 (two) times daily.  [name] Kitchen diltiazem (CARDIZEM) 30 MG tablet TAKE 1 TABLET(30 MG) BY MOUTH EVERY 8 HOURS AS NEEDED (Patient taking differently: Take 30 mg by mouth 3 (three) times daily as needed. Blood pressure)  . furosemide (LASIX) 20 MG tablet Take 20 mg by mouth daily.   . hydrOXYzine (ATARAX/VISTARIL) 10 MG tablet Take 10 mg by mouth daily as needed for anxiety or sleep.  [name] Kitchen ibuprofen (ADVIL) 800 MG tablet Take 1 tablet (800 mg total) by mouth 3 (three) times daily.  [name] Kitchen losartan-hydrochlorothiazide (HYZAAR) 100-25 MG tablet TAKE 1 TABLET BY MOUTH DAILY  . meloxicam (MOBIC) 15 MG tablet Take 15 mg by mouth at bedtime.   . Multiple Vitamin (MULTIVITAMIN WITH MINERALS) TABS tablet Take 1 tablet by mouth daily.  [name] Kitchen omeprazole (PRILOSEC) 20 MG capsule Take 20 mg by mouth daily.  . Potassium Gluconate 550 MG TABS Take 550 mg by mouth daily.    Objective: BP (!) 157/102   Pulse 92   Ht 5\' 6"  (1.676 m)   Wt 242 lb (109.8 kg)   BMI 39.06 kg/m   Physical Exam:  General:  Alert and oriented, no acute distress Gait: Normal  Evaluation the right wrist demonstrates some swelling without obvious deformity.  There are some ecchymosis about the wrist.  Limited range of motion as tolerated.  Active motion is intact in the AIN/PIN/U nerve distribution.  Sensation is intact in the M/U/R nerve distribution.  Fingers are warm and well-perfused.    IMAGING: I personally ordered and reviewed the following images  X-ray of the right wrist demonstrates a distal radius fracture without obvious intra-articular extension.  There is slight loss of volar tilt, and is currently in a neutral alignment reviewed.  Impression: Right distal radius fracture, extra-articular, in acceptable alignment.  New  Medications:  No orders of the defined types were placed in this encounter.     , MD  03/27/2020 12:47 PM

## 2020-03-27 NOTE — Patient Instructions (Signed)
General Cast Instructions  1.  You were placed in a cast in clinic today.  Please keep the cast material clean, dry and intact.  Please do not use anything to itch the under the cast.  If it gets itchy, you can consider taking benadryl, or similar medication.  If the cast material gets wet, place it on a towel and use a hair dryer on a low setting. 2.  Tylenol or Ibuprofen/Naproxen as needed.   3.  Recommend elevating your extremity as much as possible to help with swelling. 4.  F/u 1 week, repeat XR

## 2020-03-27 NOTE — Progress Notes (Signed)
Pink short arm cast applied, patient instructed on finger and elbow ROM told her if cast becomes loose since she is quite swollen to let us know and we can change cast for her. Patient tolerated cast placement well and voiced understanding of instruction s

## 2020-04-03 ENCOUNTER — Ambulatory Visit: Payer: Medicare HMO

## 2020-04-03 ENCOUNTER — Other Ambulatory Visit: Payer: Self-pay

## 2020-04-03 ENCOUNTER — Ambulatory Visit (INDEPENDENT_AMBULATORY_CARE_PROVIDER_SITE_OTHER): Payer: Medicare HMO | Admitting: Orthopedic Surgery

## 2020-04-03 ENCOUNTER — Encounter: Payer: Self-pay | Admitting: Orthopedic Surgery

## 2020-04-03 DIAGNOSIS — S52531A Colles' fracture of right radius, initial encounter for closed fracture: Secondary | ICD-10-CM

## 2020-04-03 NOTE — Progress Notes (Signed)
Orthopaedic Clinic Return  Assessment: Kathryn Olsen is a 70 y.o. female with the following  Right distal radius fracture, extra-articular; continue nonoperative management  Plan: Reviewed XR of right wrist in cast, compared to last week.  Minimal change.  Remains in acceptable alignment.  Will continue with current cast for another week.  Discussed expected outcomes with current alignment.  If no subsidence of change from imaging today, anticipate good function.  Follow up in 1 week for repeat XR and new cast.   Follow-up: Return in about 1 week (around 04/10/2020).   Subjective:  Chief Complaint  Patient presents with  . Wrist Pain    Right wrist f/u, feels like it is moving.  Has pain with this, cannot use the thumb due to pain    History of Present Illness: Kathryn Olsen is a 70 y.o. RHD female who returns for repeat evaluation of her right wrist.  She was seen in clinic one week ago after sustaining an injury to her right wrist after a fall.  She has tolerated the cast well.  Her pain has improved, but she has taken some tramadol recently.  She feels that the cast is getting loose, compared to the original fit.  She has difficulty using her thumb.  She has some pain in the dorsal aspect of her hand.  Review of Systems: No fevers or chills No numbness or tingling No chest pain No shortness of breath No bowel or bladder dysfunction No GI distress No headaches   Objective: There were no vitals taken for this visit.  Physical Exam:  Cast is clean, dry and intact.  Fingers are warm and well-perfused.  Sensation is intact to exposed fingers.  Limited motion and strength of her left thumb due to pain.  No obvious skin breakdown at the edges of the cast.  IMAGING: I personally ordered and reviewed the following images:   X-rays of the right wrist were obtained in clinic today in the cast and these demonstrate an extra-articular distal radius fracture with  unchanged alignment, compared to previous x-rays.  There is slight loss of volar tilt, and joint line remains neutral on the lateral view.  Impression: Extra-articular right distal radius fracture in unchanged alignment.  Oliver Barre, MD 04/03/2020 4:58 PM

## 2020-04-08 ENCOUNTER — Other Ambulatory Visit (HOSPITAL_COMMUNITY): Payer: Self-pay | Admitting: Nurse Practitioner

## 2020-04-08 DIAGNOSIS — E2839 Other primary ovarian failure: Secondary | ICD-10-CM

## 2020-04-10 ENCOUNTER — Ambulatory Visit (INDEPENDENT_AMBULATORY_CARE_PROVIDER_SITE_OTHER): Payer: Medicare HMO | Admitting: Orthopedic Surgery

## 2020-04-10 ENCOUNTER — Other Ambulatory Visit: Payer: Self-pay

## 2020-04-10 ENCOUNTER — Ambulatory Visit: Payer: Medicare HMO

## 2020-04-10 ENCOUNTER — Encounter: Payer: Self-pay | Admitting: Orthopedic Surgery

## 2020-04-10 DIAGNOSIS — S52531D Colles' fracture of right radius, subsequent encounter for closed fracture with routine healing: Secondary | ICD-10-CM | POA: Diagnosis not present

## 2020-04-10 NOTE — Progress Notes (Signed)
Orthopaedic Clinic Return  Assessment: Kathryn Olsen is a 70 y.o. female with the following  Right distal radius fracture, extra-articular; continue nonoperative management  Plan: Her cast was removed in clinic today, and subsequent x-rays demonstrates no interval displacement.  She is healing in acceptable alignment.  I think she will regain good function in her right wrist once this is completely healed.  As we have previously discussed, there is some dorsal angulation of the joint line compared to normal alignment, and this may slightly affect her motion.  She may also have a slight deformity once it is healed.  Patient continues to be amenable to nonoperative treatment, and is aware of the potential limitations as she heals.  She was placed in another cast today, and we will see her back in approximately 3 weeks for cast removal and additional x-rays.  Cast application - right short arm cast   Verbal consent was obtained and the correct extremity was identified. A well padded, appropriately molded short arm cast was applied to the right arm Fingers remained warm and well perfused.   There were no sharp edges Patient tolerated the procedure well Cast care instructions were provided    Follow-up: Return in about 3 weeks (around 05/01/2020).   Subjective:  Chief Complaint  Patient presents with  . Wrist Pain    R/ still hurts/ most of time it doesn't hurt so bad,but then again its does    History of Present Illness: Kathryn Olsen is a 70 y.o. RHD female who returns for repeat evaluation of her right wrist.  She was last seen in clinic approximately 1 week ago.  Her injury occurred 2 weeks ago.  She has been treated without surgery, immobilized in a cast.  She has done well.  Her pain continues to improve.  However, she does note some shooting pains occasionally.  She has been working on using the exposed fingers, and is happy with her range of motion thus  far.  Review of Systems: No fevers or chills No numbness or tingling No chest pain No shortness of breath No bowel or bladder dysfunction No GI distress No headaches   Objective: There were no vitals taken for this visit.  Physical Exam:  Evaluation of the right wrist demonstrates some residual swelling around the distal portion of her forearm.  There are no lesions associated with the injury.  There is no skin breakdown associated with the cast.  She tolerates gentle range of motion in her wrist.  She has near full range of motion in all of her fingers.  Some residual swelling over the dorsal aspect of her hand and fingers.  There is some residual ecchymosis on the ulnar forearm.  Fingers are warm and well perfused.  IMAGING: I personally ordered and reviewed the following images:   X-rays of the right wrist out of the cast were obtained and demonstrates no interval displacement of a distal radius fracture.  There is not appear to be any intra-articular involvement.  Slight dorsal tilt of the joint line.  Impression: Right distal radius fracture, extra-articular in acceptable alignment.  Oliver Barre, MD 04/10/2020 12:38 PM

## 2020-04-22 ENCOUNTER — Ambulatory Visit (HOSPITAL_COMMUNITY)
Admission: RE | Admit: 2020-04-22 | Discharge: 2020-04-22 | Disposition: A | Discharge status: Home / Self Care | Payer: Medicare HMO | Source: Ambulatory Visit | Attending: Nurse Practitioner | Admitting: Nurse Practitioner

## 2020-04-22 ENCOUNTER — Other Ambulatory Visit: Payer: Self-pay

## 2020-04-22 DIAGNOSIS — E2839 Other primary ovarian failure: Secondary | ICD-10-CM | POA: Insufficient documentation

## 2020-04-24 ENCOUNTER — Ambulatory Visit: Payer: Medicare HMO

## 2020-04-24 ENCOUNTER — Other Ambulatory Visit: Payer: Self-pay

## 2020-04-24 ENCOUNTER — Ambulatory Visit (INDEPENDENT_AMBULATORY_CARE_PROVIDER_SITE_OTHER): Payer: Medicare HMO | Admitting: Orthopedic Surgery

## 2020-04-24 ENCOUNTER — Encounter: Payer: Self-pay | Admitting: Orthopedic Surgery

## 2020-04-24 DIAGNOSIS — S52531D Colles' fracture of right radius, subsequent encounter for closed fracture with routine healing: Secondary | ICD-10-CM

## 2020-04-24 NOTE — Patient Instructions (Signed)

## 2020-04-24 NOTE — Progress Notes (Signed)
Orthopaedic Clinic Return  Assessment: Kathryn Olsen is a 70 y.o. female with the following  Right distal radius fracture, extra-articular; continue nonoperative management  Plan: The cast was removed from her right wrist in clinic today.  Repeat x-rays demonstrates maintenance of alignment.  Her pain and function continues to improve.  However, she does note continued pain, swelling and discomfort with certain motions.  After discussing this with her, we elected to put her back into another cast for 2 weeks.  After removal of the next cast, we will place her into a removable wrist splint.  I remain confident that she will have good function when she recovers from this injury, despite the dorsal angulation of the joint line on x-rays.  Cast application - right short arm cast   Verbal consent was obtained and the correct extremity was identified. A well padded, appropriately molded short arm cast was applied to the right arm Fingers remained warm and well perfused.   There were no sharp edges Patient tolerated the procedure well Cast care instructions were provided    Follow-up: Return in about 2 weeks (around 05/08/2020).   Subjective:  Chief Complaint  Patient presents with  . Wrist Pain    R/ doing ok, fingers still swelling some    History of Present Illness: Kathryn Olsen is a 70 y.o. RHD female who returns for repeat evaluation of her right wrist.  She has been in a cast for approximately 4 weeks.  Her pain continues to improve.  She notices more function within her fingers.  However, she continues to have pain.  She notes with certain motions, there is sharp pains which radiate proximally.  She is not taking medications on a consistent basis.  No numbness or tingling.  She tolerated the cast well.   Review of Systems: No fevers or chills No numbness or tingling No chest pain No shortness of breath No bowel or bladder dysfunction No GI distress No  headaches   Objective: There were no vitals taken for this visit.  Physical Exam:  Right wrist with mild residual deformity.  There continues to be some persistent swelling.  Fingers demonstrate diffuse swelling.  She has near full range of motion of her fingers.  Limited range of motion at the wrist due to pain.  Fingers are warm and well-perfused.  Sensation is intact in all nerve distributions.  IMAGING: I personally ordered and reviewed the following images:   As of the right wrist were obtained in clinic today out of the cast and demonstrate maintenance of alignment.  She continues to have a dorsally angulated joint line, with some loss of radial height.  X-rays remain unchanged compared to previous.  There is evidence of interval consolidation at the fracture site.  Impression: Healing right distal radius fracture, with slight dorsal angulation of the joint line.  Oliver Barre, MD 04/24/2020 12:41 PM

## 2020-05-01 ENCOUNTER — Other Ambulatory Visit: Payer: Self-pay

## 2020-05-01 ENCOUNTER — Encounter (HOSPITAL_COMMUNITY): Payer: Self-pay | Admitting: Hematology and Oncology

## 2020-05-01 ENCOUNTER — Inpatient Hospital Stay (HOSPITAL_COMMUNITY): Payer: Medicare HMO | Attending: Hematology and Oncology | Admitting: Hematology and Oncology

## 2020-05-01 ENCOUNTER — Inpatient Hospital Stay (HOSPITAL_COMMUNITY): Payer: Medicare HMO

## 2020-05-01 VITALS — BP 132/93 | HR 86 | Temp 97.2°F | Resp 18 | Ht 65.0 in | Wt 245.1 lb

## 2020-05-01 DIAGNOSIS — Z801 Family history of malignant neoplasm of trachea, bronchus and lung: Secondary | ICD-10-CM | POA: Diagnosis not present

## 2020-05-01 DIAGNOSIS — Z79899 Other long term (current) drug therapy: Secondary | ICD-10-CM | POA: Diagnosis not present

## 2020-05-01 DIAGNOSIS — R197 Diarrhea, unspecified: Secondary | ICD-10-CM | POA: Diagnosis not present

## 2020-05-01 DIAGNOSIS — M1990 Unspecified osteoarthritis, unspecified site: Secondary | ICD-10-CM | POA: Insufficient documentation

## 2020-05-01 DIAGNOSIS — K5900 Constipation, unspecified: Secondary | ICD-10-CM | POA: Insufficient documentation

## 2020-05-01 DIAGNOSIS — Z791 Long term (current) use of non-steroidal anti-inflammatories (NSAID): Secondary | ICD-10-CM | POA: Insufficient documentation

## 2020-05-01 DIAGNOSIS — K219 Gastro-esophageal reflux disease without esophagitis: Secondary | ICD-10-CM | POA: Diagnosis not present

## 2020-05-01 DIAGNOSIS — K449 Diaphragmatic hernia without obstruction or gangrene: Secondary | ICD-10-CM | POA: Diagnosis not present

## 2020-05-01 DIAGNOSIS — J449 Chronic obstructive pulmonary disease, unspecified: Secondary | ICD-10-CM | POA: Diagnosis not present

## 2020-05-01 DIAGNOSIS — G4730 Sleep apnea, unspecified: Secondary | ICD-10-CM | POA: Diagnosis not present

## 2020-05-01 DIAGNOSIS — I10 Essential (primary) hypertension: Secondary | ICD-10-CM

## 2020-05-01 DIAGNOSIS — Z7982 Long term (current) use of aspirin: Secondary | ICD-10-CM | POA: Insufficient documentation

## 2020-05-01 DIAGNOSIS — M8580 Other specified disorders of bone density and structure, unspecified site: Secondary | ICD-10-CM | POA: Insufficient documentation

## 2020-05-01 DIAGNOSIS — D751 Secondary polycythemia: Secondary | ICD-10-CM | POA: Diagnosis present

## 2020-05-01 DIAGNOSIS — M85859 Other specified disorders of bone density and structure, unspecified thigh: Secondary | ICD-10-CM | POA: Insufficient documentation

## 2020-05-01 DIAGNOSIS — E785 Hyperlipidemia, unspecified: Secondary | ICD-10-CM | POA: Diagnosis not present

## 2020-05-01 LAB — CBC WITH DIFFERENTIAL/PLATELET
Abs Immature Granulocytes: 0.03 10*3/uL (ref 0.00–0.07)
Basophils Absolute: 0.1 10*3/uL (ref 0.0–0.1)
Basophils Relative: 1 %
Eosinophils Absolute: 0.3 10*3/uL (ref 0.0–0.5)
Eosinophils Relative: 4 %
HCT: 49.1 % — ABNORMAL HIGH (ref 36.0–46.0)
Hemoglobin: 15.5 g/dL — ABNORMAL HIGH (ref 12.0–15.0)
Immature Granulocytes: 0 %
Lymphocytes Relative: 22 %
Lymphs Abs: 1.9 10*3/uL (ref 0.7–4.0)
MCH: 28.6 pg (ref 26.0–34.0)
MCHC: 31.6 g/dL (ref 30.0–36.0)
MCV: 90.6 fL (ref 80.0–100.0)
Monocytes Absolute: 0.7 10*3/uL (ref 0.1–1.0)
Monocytes Relative: 8 %
Neutro Abs: 5.5 10*3/uL (ref 1.7–7.7)
Neutrophils Relative %: 65 %
Platelets: 290 10*3/uL (ref 150–400)
RBC: 5.42 MIL/uL — ABNORMAL HIGH (ref 3.87–5.11)
RDW: 13 % (ref 11.5–15.5)
WBC: 8.4 10*3/uL (ref 4.0–10.5)
nRBC: 0 % (ref 0.0–0.2)

## 2020-05-01 LAB — COMPREHENSIVE METABOLIC PANEL
ALT: 22 U/L (ref 0–44)
AST: 22 U/L (ref 15–41)
Albumin: 4.1 g/dL (ref 3.5–5.0)
Alkaline Phosphatase: 79 U/L (ref 38–126)
Anion gap: 9 (ref 5–15)
BUN: 19 mg/dL (ref 8–23)
CO2: 27 mmol/L (ref 22–32)
Calcium: 9.6 mg/dL (ref 8.9–10.3)
Chloride: 104 mmol/L (ref 98–111)
Creatinine, Ser: 0.66 mg/dL (ref 0.44–1.00)
GFR, Estimated: >60 mL/min (ref >60)
Glucose, Bld: 93 mg/dL (ref 70–99)
Potassium: 3.4 mmol/L — ABNORMAL LOW (ref 3.5–5.1)
Sodium: 140 mmol/L (ref 135–145)
Total Bilirubin: 0.8 mg/dL (ref 0.3–1.2)
Total Protein: 7 g/dL (ref 6.5–8.1)

## 2020-05-01 NOTE — Progress Notes (Signed)
[facility] CONSULT NOTE  Patient Care Team: Iona Hansen, NP as PCP - General (Nurse Practitioner)  CHIEF COMPLAINTS/PURPOSE OF CONSULTATION:  Polycythemia.  ASSESSMENT & PLAN:  No problem-specific Assessment & Plan notes found for this encounter.  No orders of the defined types were placed in this encounter.  This is a very pleasant 70 yr old female patient referred to hematology for evaluation of polycythemia.  1. 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 Pt complains of symptoms concerning for OSA but has not been diagnosed with it. She has multitude of other symptoms going on. No hyperviscosity symptoms. Recommended MPN work up. Discussed about causes of primary and secondary polycythemia. She is agreeable to lab work up and return to clinic for additonal recommendations.  2. Concern for OSA, would recommend sleep testing for this patient.  3. Age appropriate cancer screening.   HISTORY OF PRESENTING ILLNESS:  Kathryn Olsen 70 y.o. female is here because of elevated hemoglobin.  Ms Hamsini arrived to the appointment by herself. She says last few months have been awful with her hiatal hernia and other health issues. She denies any B symptoms. No hyperviscosity symptoms. No thromboembolic episodes. She wakes up tired and feels sleepy all through the day. No known diagnosis of COPD Never smoker. She had recently fallen, right arm in a wrap. Bowel habits keep changing between constipation and diarrhea since she has a tortous colon. No major urinary complaints.  Rest of the pertinent 10 point ROS reviewed and negative  MEDICAL HISTORY:  Past Medical History:  Diagnosis Date  . Asthma   . COPD (chronic obstructive pulmonary disease) (HCC)    questionable  . Degeneration of intervertebral disc, site unspecified   . Depression   . DJD (degenerative joint disease)   . Dyslipidemia   .  GERD (gastroesophageal reflux disease)   . Pneumonia 2/15  . Shortness of breath    on exertion  . Sleep apnea    stopbang=4  . Unspecified essential hypertension     SURGICAL HISTORY: Past Surgical History:  Procedure Laterality Date  . APPENDECTOMY    . ECTOPIC PREGNANCY SURGERY    . KNEE ARTHROSCOPY  2010    Right knee  . KNEE ARTHROSCOPY  2012   Left knee  . 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 TOTAL KNEE REVISION Left 09/23/2013   Procedure: REVISION LEFT TOTAL KNEE WITH POLY EXCHANGE UPSIZE AND PATELLA REVISION;  Surgeon: Shelda Pal, MD;  Location: [facility];  Service: Orthopedics;  Laterality: Left;    SOCIAL HISTORY: Social History   Socioeconomic History  . Marital status: Married    Spouse name: Not on file  . Number of children: 2  . Years of education: Not on file  . Highest education level: Not on file  Occupational History  . Occupation: Nurse    Employer: [employer]  Tobacco Use  . Smoking status: Passive Smoke Exposure - Never Smoker  . Smokeless tobacco: Never Used  . Tobacco comment: both parents smoked  Vaping Use  . Vaping Use: Never used  Substance and Sexual Activity  . Alcohol use: Yes    Alcohol/week: 1.0 standard drink    Types: 1 Glasses of wine per week    Comment: occasional glass of wine  . Drug use: No  . Sexual activity: Not Currently  Other Topics Concern  . Not on file  Social History Narrative  . Not on file   Social Determinants of Health   Financial Resource Strain: Not on file  Food Insecurity: Not on file  Transportation Needs: Not on file  Physical Activity: Not on file  Stress: Not on file  Social Connections: Not on file  Intimate Partner Violence: Not on file    FAMILY HISTORY: Family History  Problem Relation Age of Onset  . Lung cancer Father   . COPD Mother   .  Hypertension Mother   . Melanoma Brother   . Alcohol abuse Brother   . Other Sister        MGUS    ALLERGIES:  is allergic to buprenorphine hcl and ace inhibitors.  MEDICATIONS:  Current Outpatient Medications  Medication Sig Dispense Refill  . albuterol (PROVENTIL HFA;VENTOLIN HFA) 108 (90 Base) MCG/ACT inhaler Inhale 1-2 puffs into the lungs every 6 (six) hours as needed for wheezing or shortness of breath. 1 Inhaler 6  . amLODipine (NORVASC) 5 MG tablet Take 5 mg by mouth every evening.    [name] Kitchen. aspirin EC 81 MG tablet Take 81 mg by mouth daily. Swallow whole.    [name] Kitchen. atorvastatin (LIPITOR) 10 MG tablet Take 10 mg by mouth daily.    . budesonide-formoterol (SYMBICORT) 80-4.5 MCG/ACT inhaler Inhale 2 puffs into the lungs 2 (two) times daily. 2 Inhaler 0  . Calcium Carb-Cholecalciferol (CALCIUM 500/D PO) Take by mouth.    . diltiazem (CARDIZEM) 30 MG tablet TAKE 1 TABLET(30 MG) BY MOUTH EVERY 8 HOURS AS NEEDED (Patient taking differently: Take 30 mg by mouth 3 (three) times daily as needed. Blood pressure) 270 tablet 1  . furosemide (LASIX) 20 MG tablet Take 20 mg by mouth daily.     [name] Kitchen. ibuprofen (ADVIL) 800 MG tablet Take 1 tablet (800 mg total) by mouth 3 (three) times daily. 30 tablet 0  . losartan-hydrochlorothiazide (HYZAAR) 100-25 MG tablet TAKE 1 TABLET BY MOUTH DAILY 90 tablet 2  . meloxicam (MOBIC) 15 MG tablet Take 15 mg by mouth at bedtime.     . Multiple Vitamin (MULTIVITAMIN WITH MINERALS) TABS tablet Take 1 tablet by mouth daily.    [name] Kitchen. omeprazole (PRILOSEC) 20 MG capsule Take 20 mg by mouth daily.    . Potassium Gluconate 550 MG TABS Take 550 mg by mouth daily.    . hydrOXYzine (ATARAX/VISTARIL) 10 MG tablet Take 10 mg by mouth daily as needed for anxiety or sleep. (Patient not taking: Reported on 05/01/2020)     No current facility-administered medications for this visit.     PHYSICAL EXAMINATION:  ECOG PERFORMANCE STATUS: 0 - Asymptomatic  Vitals:   05/01/20 1312  BP: (!)  132/93  Pulse: 86  Resp: 18  Temp: (!) 97.2 F (36.2 C)  SpO2: 94%   Filed Weights   05/01/20 1312  Weight: 245 lb 1.6 oz (111.2 kg)    GENERAL:alert, no distress and comfortable SKIN: skin color, texture, turgor are normal, no rashes or significant lesions EYES: normal, conjunctiva are pink and non-injected, sclera clear, obese OROPHARYNX:no exudate, no erythema and lips, buccal mucosa, and tongue normal  NECK: supple, thyroid normal size, non-tender, without nodularity LYMPH:  no palpable lymphadenopathy in the cervical, axillary LUNGS: clear to auscultation and percussion with normal breathing effort HEART: regular rate & rhythm and no murmurs and no lower extremity edema ABDOMEN:abdomen soft, non-tender and normal bowel sounds Musculoskeletal:no cyanosis of digits and no clubbing  PSYCH: alert & oriented x 3 with fluent speech NEURO: no focal motor/sensory deficits  LABORATORY DATA:  I have reviewed the data as listed Lab Results  Component Value Date   WBC 10.9 (H) 02/05/2020   HGB 16.0 (H) 02/05/2020   HCT 50.2 (H) 02/05/2020   MCV 91.6 02/05/2020   PLT 308 02/05/2020     Chemistry      Component Value Date/Time   NA 143 02/05/2020 1242   K 3.8 02/05/2020 1242   CL 107 02/05/2020 1242   CO2 25 02/05/2020 1242   BUN 23 02/05/2020 1242   CREATININE 0.97 02/05/2020 1242      Component Value Date/Time   CALCIUM 9.3 02/05/2020 1242   ALKPHOS 76 02/05/2020 1242   AST 26 02/05/2020 1242   ALT 22 02/05/2020 1242   BILITOT 0.7 02/05/2020 1242       RADIOGRAPHIC STUDIES: I have personally reviewed the radiological images as listed and agreed with the findings in the report. DG Wrist Complete Right  Result Date: 04/24/2020 XR of the right wrist were obtained in clinic today out of the cast and demonstrate maintenance of alignment.  She continues to have a dorsally angulated joint line, with some loss of radial height.  X-rays remain unchanged compared to  previous.  There is evidence of interval consolidation at the fracture site.  Impression: Healing right distal radius fracture, with slight dorsal angulation of the joint line.  DG Wrist Complete Right  Result Date: 04/10/2020  X-rays of the right wrist out of the cast were obtained and demonstrates no interval displacement of a distal radius fracture.  There is not appear to be any intra-articular involvement.  Slight dorsal tilt of the joint line.  Impression: Right distal radius fracture, extra-articular in acceptable alignment.  DG Wrist Complete Right  Result Date: 04/03/2020  X-rays of the right wrist were obtained in clinic today in the cast and these demonstrate an extra-articular distal radius fracture with unchanged alignment, compared to previous x-rays.  There is slight loss of volar tilt, and joint line remains neutral on the lateral view.  Impression: Extra-articular right distal radius fracture in unchanged alignment.  DG BONE DENSITY (DXA)  Result Date: 04/22/2020 EXAM: DUAL X-RAY ABSORPTIOMETRY (DXA) FOR BONE MINERAL DENSITY IMPRESSION: Your patient Kathryn Olsen completed a BMD test on 04/22/2020 using the [employer] DXA System (software version: 14.10) manufactured by [employer]. The following summarizes the results of our evaluation. Technologist:TNB PATIENT BIOGRAPHICAL: Name: Kathryn, Olsen Patient ID: [id number] Birth Date: October 26, 1950 Height: 66.0 in. Gender: Female Exam Date: 04/22/2020 Weight: 242.0 lbs. Indications: Caucasian, Height Loss, History of Fracture (Adult), Parent Hip Fracture, Partial Hysterectomy, Post Menopausal, Unilateral oophrectomy Fractures: Foot, Toe, Wrist Treatments: Calcium, Vitamin D DENSITOMETRY RESULTS: Site         Region     Measured Date Measured Age WHO Classification Young Adult T-score BMD         %Change vs. Previous Significant Change (*) DualFemur Neck Left 04/22/2020 71.9 Osteopenia -2.1 0.741 g/cm2 Left  Forearm Radius 33% 04/22/2020 71.9 Osteopenia -1.8 0.589 g/cm2 ASSESSMENT: The BMD measured at Femur Neck Left is 0.741 g/cm2 with a T-score of -2.1. This patient is considered osteopenic according to World Health Organization [facility]) criteria. The scan quality is good. Per official position of the ISCD, it is not possible to quantitatively compare BMD or calculate a LSC between different facilities or devices. Lumbar spine was excluded due to advanced degenerative changes. World Science writer (  WHO) criteria for post-menopausal, Caucasian Women: Normal:       T-score at or above -1 SD Osteopenia:   T-score between -1 and -2.5 SD Osteoporosis: T-score at or below -2.5 SD RECOMMENDATIONS: 1. All patients should optimize calcium and vitamin D intake. 2. Consider FDA-approved medical therapies in postmenopausal women and med aged 74 years and older, based on the following: a. A hip or vertebral (clinical or morphometric) fracture b. T-score< -2.5 at the femoral neck or spine after appropriate evaluation to exclude secondary causes c. Low bone mass (T-score between -1.0 and -2.5 at the femoral neck or spine) and a 10-year probability of a hip fracture > 3% or a 10-year probability of a major osteoporosis-related fracture > 20% based on the [place]-adapted WHO algorithm d. Clinician judgment and/or patient preferences may indicate treatment for people with 10-year fracture probabilities above or below these levels FOLLOW-UP: People with diagnosed cases of osteoporosis or at high risk for fracture should have regular bone mineral density tests. For patients eligible for Medicare, routine testing is allowed once every 2 years. The testing frequency can be increased to one year for patients who have rapidly progressing disease, those who are receiving or discontinuing medical therapy to restore bone mass, or have additional risk factors. I have reviewed this report, and agree with the above findings. [facility] Your patient Kathryn Olsen completed a FRAX assessment on 04/22/2020 using the [employer] DXA System (analysis version: 14.10) manufactured by [employer]. The following summarizes the results of our evaluation. PATIENT BIOGRAPHICAL: Name: Kathryn, Olsen Patient ID: [id number] Birth Date: [birth year] Height:    66.0 in. Gender:     Female    [age]       Weight:    242.0 lbs. Ethnicity:  White                            Exam Date: 04/22/2020 FRAX* RESULTS:  (version: 3.5) 10-year Probability of Fracture1 Major Osteoporotic Fracture2 Hip Fracture 18.3% 3.8% Population: [place] (Caucasian) Risk Factors: History of Fracture (Adult) Based on Femur (Left) Neck BMD 1 -The 10-year probability of fracture may be lower than reported if the patient has received treatment. 2 -Major Osteoporotic Fracture: Clinical Spine, Forearm, Hip or Shoulder *FRAX is a Armed forces logistics/support/administrative officer of the [employer] of [employer] for Metabolic Bone Disease, a World Science writer (WHO) [employer]. ASSESSMENT: The probability of a major osteoporotic fracture is 18.3% within the next ten years. The probability of a hip fracture is 3.8% within the next ten years. Electronically Signed   By: Charlett Nose M.D.   On: 04/22/2020 19:59    All questions were answered. The patient knows to call the clinic with any problems, questions or concerns. I spent 45 minutes in the care of this patient including H and P, review of records, counseling and coordination of care.     Rachel Moulds, MD 05/01/2020 1:31 PM

## 2020-05-02 LAB — ERYTHROPOIETIN: Erythropoietin: 13.9 m[IU]/mL (ref 2.6–18.5)

## 2020-05-08 ENCOUNTER — Other Ambulatory Visit: Payer: Self-pay

## 2020-05-08 ENCOUNTER — Ambulatory Visit (INDEPENDENT_AMBULATORY_CARE_PROVIDER_SITE_OTHER): Payer: Medicare HMO | Admitting: Orthopedic Surgery

## 2020-05-08 ENCOUNTER — Ambulatory Visit: Payer: Medicare HMO

## 2020-05-08 ENCOUNTER — Encounter: Payer: Self-pay | Admitting: Orthopedic Surgery

## 2020-05-08 VITALS — Ht 65.0 in | Wt 240.0 lb

## 2020-05-08 DIAGNOSIS — S52531D Colles' fracture of right radius, subsequent encounter for closed fracture with routine healing: Secondary | ICD-10-CM

## 2020-05-08 NOTE — Progress Notes (Signed)
Orthopaedic Clinic Return  Assessment: Kathryn Olsen is a 70 y.o. female with the following  Right distal radius fracture, extra-articular; nonoperative management  Plan: Cast removed, XR without significant change in alignment Transition to removable wrist brace Ok to initiate ROM as tolerated Limit lifting to nothing more than a gallon of milk for the next month If having difficulty with ROM and is interested in OT, she can call to request a referral.  Follow up 6 weeks for ROM check  Follow-up: Return in about 6 weeks (around 06/19/2020).   Subjective:  Chief Complaint  Patient presents with  . Wrist Pain    Right wrist, patient reports that she is doing better.     History of Present Illness: Kathryn Olsen is a 70 y.o. RHD female who returns for repeat evaluation of her right wrist.  She has been in a cast for approximately 6 weeks.  She has done well with a cast.  Her pain continues to improve.  She occasionally takes some Tylenol or ibuprofen, but this is infrequent.  No numbness or tingling distally.  She has tried to remain active with her exposed fingers, working on range of motion and some dexterity.   Review of Systems: No fevers or chills No numbness or tingling No chest pain No shortness of breath No bowel or bladder dysfunction No GI distress No headaches   Objective: Ht 5\' 5"  (1.651 m)   Wt 240 lb (108.9 kg)   BMI 39.94 kg/m   Physical Exam:  Right wrist with mild swelling overall.  She does have a mild deformity.  She tolerates passive extension at the wrist to 30 degrees, passive flexion to 30 degrees.  Limited supination at this time.  She is able to make a full fist.  She does have some tenderness to palpation at the radial styloid, but minimal tenderness along the wrist otherwise.  Sensation is intact distally.  Fingers are warm and well perfused.  No significant skin breakdown after removal of the cast, which she has some residual  dry skin.  IMAGING: I personally ordered and reviewed the following images:  X-rays of the right wrist demonstrate an extra-articular fracture, with some dorsal angulation.  X-rays overall remain consistent without interval displacement.  There is some interval consolidation compared to previous x-rays.  Impression: Healing right, extra-articular distal radius fracture; unchanged position  , MD 05/08/2020 9:13 AM

## 2020-05-12 ENCOUNTER — Telehealth: Payer: Self-pay | Admitting: Orthopedic Surgery

## 2020-05-12 NOTE — Telephone Encounter (Signed)
Please advise 

## 2020-05-12 NOTE — Telephone Encounter (Signed)
Patient called to relay, since her cast was removed, and then fitted with brace Friday, 05/08/20, that her fingers are going numb. Please advise.

## 2020-05-12 NOTE — Telephone Encounter (Signed)
Thanks Francesco Runner  Which fingers?  She can loosen the brace to see if that makes a difference, or she can DC the brace.  If she is still having issues, she can come back in and we can take a look.   Thanks [employer]

## 2020-05-13 NOTE — Telephone Encounter (Signed)
I left a message for the patient to call back with more information.

## 2020-05-13 NOTE — Telephone Encounter (Signed)
I called the patient and she is not sure if this is related to the position that she is in. She reports some tingling and then comes some numbness and she can let this sit in her lap and the numbness comes and goes.  She is taking the brace off some to get some feeling back in her hands.  She is going to call back if she needs a follow up in the future. She reports she will take this day by day at this time. For your records.

## 2020-05-15 ENCOUNTER — Encounter (HOSPITAL_COMMUNITY): Payer: Self-pay | Admitting: Hematology and Oncology

## 2020-05-15 ENCOUNTER — Inpatient Hospital Stay (HOSPITAL_COMMUNITY): Payer: Medicare HMO | Attending: Hematology and Oncology | Admitting: Hematology and Oncology

## 2020-05-15 ENCOUNTER — Other Ambulatory Visit: Payer: Self-pay

## 2020-05-15 VITALS — BP 121/71 | HR 83 | Temp 96.8°F | Resp 16 | Wt 243.0 lb

## 2020-05-15 DIAGNOSIS — D751 Secondary polycythemia: Secondary | ICD-10-CM | POA: Diagnosis present

## 2020-05-15 DIAGNOSIS — M25539 Pain in unspecified wrist: Secondary | ICD-10-CM | POA: Diagnosis not present

## 2020-05-15 DIAGNOSIS — J449 Chronic obstructive pulmonary disease, unspecified: Secondary | ICD-10-CM | POA: Insufficient documentation

## 2020-05-15 DIAGNOSIS — I10 Essential (primary) hypertension: Secondary | ICD-10-CM

## 2020-05-15 DIAGNOSIS — G4730 Sleep apnea, unspecified: Secondary | ICD-10-CM | POA: Insufficient documentation

## 2020-05-15 DIAGNOSIS — E785 Hyperlipidemia, unspecified: Secondary | ICD-10-CM | POA: Diagnosis not present

## 2020-05-15 DIAGNOSIS — K449 Diaphragmatic hernia without obstruction or gangrene: Secondary | ICD-10-CM | POA: Diagnosis not present

## 2020-05-15 DIAGNOSIS — K219 Gastro-esophageal reflux disease without esophagitis: Secondary | ICD-10-CM | POA: Insufficient documentation

## 2020-05-15 NOTE — Progress Notes (Signed)
[facility] CONSULT NOTE  Patient Care Team: Iona Hansen, NP as PCP - General (Nurse Practitioner)  CHIEF COMPLAINTS/PURPOSE OF CONSULTATION:  Polycythemia.  ASSESSMENT & PLAN:  No problem-specific Assessment & Plan notes found for this encounter.  No orders of the defined types were placed in this encounter.  This is a very pleasant 70 yr old female patient referred to hematology for evaluation of polycythemia.  1. Polycythemia, likely secondary  Mild polycythemia with no basophilia, thrombocytosis or leukocytosis. Pt complains of symptoms concerning for OSA but has not been diagnosed with it. She has multitude of other symptoms going on. No hyperviscosity symptoms. Recommended MPN work up. JAK 2 panel still pending, epo with in normal limits. Discussed about causes of primary and secondary polycythemia. If JAK 2 testing is unremarkable, then she will continue FU in 3 months. She is anyway going to [place] and will stay there for a month or so, will return in May,  2. Concern for OSA, would recommend sleep testing for this patient.  3. Age appropriate cancer screening.   HISTORY OF PRESENTING ILLNESS:  Kathryn Olsen 70 y.o. female is here because of elevated hemoglobin.  Interim History  Kathryn Olsen is here for FU. She denies any complaints except for the hiatal hernia issues and ongoing wrist pain. No other hyperviscosity symptoms.  Rest of the pertinent 10 point ROS reviewed and negative  MEDICAL HISTORY:  Past Medical History:  Diagnosis Date  . Asthma   . COPD (chronic obstructive pulmonary disease) (HCC)    questionable  . Degeneration of intervertebral disc, site unspecified   . Depression   . DJD (degenerative joint disease)   . Dyslipidemia   . GERD (gastroesophageal reflux disease)   . Pneumonia 2/15  . Shortness of breath    on exertion  . Sleep apnea    stopbang=4  . Unspecified essential hypertension     SURGICAL  HISTORY: Past Surgical History:  Procedure Laterality Date  . APPENDECTOMY    . ECTOPIC PREGNANCY SURGERY    . KNEE ARTHROSCOPY  2010    Right knee  . KNEE ARTHROSCOPY  2012   Left knee  . 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 TOTAL KNEE REVISION Left 09/23/2013   Procedure: REVISION LEFT TOTAL KNEE WITH POLY EXCHANGE UPSIZE AND PATELLA REVISION;  Surgeon: Shelda Pal, MD;  Location: [facility];  Service: Orthopedics;  Laterality: Left;    SOCIAL HISTORY: Social History   Socioeconomic History  . Marital status: Married    Spouse name: Not on file  . Number of children: 2  . Years of education: Not on file  . Highest education level: Not on file  Occupational History  . Occupation: Nurse    Employer: [employer]  Tobacco Use  . Smoking status: Passive Smoke Exposure - Never Smoker  . Smokeless tobacco: Never Used  . Tobacco comment: both parents smoked  Vaping Use  . Vaping Use: Never used  Substance and Sexual Activity  . Alcohol use: Yes    Alcohol/week: 1.0 standard drink    Types: 1 Glasses of wine per week    Comment: occasional glass of wine  . Drug use: No  . Sexual activity: Not Currently  Other Topics Concern  . Not on file  Social History Narrative  . Not on file   Social Determinants  of Health   Financial Resource Strain: Not on file  Food Insecurity: Not on file  Transportation Needs: Not on file  Physical Activity: Not on file  Stress: Not on file  Social Connections: Not on file  Intimate Partner Violence: Not on file    FAMILY HISTORY: Family History  Problem Relation Age of Onset  . Lung cancer Father   . COPD Mother   . Hypertension Mother   . Melanoma Brother   . Alcohol abuse Brother   . Other Sister        MGUS    ALLERGIES:  is allergic to buprenorphine hcl and ace inhibitors.  MEDICATIONS:   Current Outpatient Medications  Medication Sig Dispense Refill  . albuterol (PROVENTIL HFA;VENTOLIN HFA) 108 (90 Base) MCG/ACT inhaler Inhale 1-2 puffs into the lungs every 6 (six) hours as needed for wheezing or shortness of breath. 1 Inhaler 6  . amLODipine (NORVASC) 5 MG tablet Take 5 mg by mouth every evening.    [name] Kitchen. aspirin EC 81 MG tablet Take 81 mg by mouth daily. Swallow whole.    [name] Kitchen. atorvastatin (LIPITOR) 10 MG tablet Take 10 mg by mouth daily.    . budesonide-formoterol (SYMBICORT) 80-4.5 MCG/ACT inhaler Inhale 2 puffs into the lungs 2 (two) times daily. 2 Inhaler 0  . Calcium Carb-Cholecalciferol (CALCIUM 500/D PO) Take by mouth.    . diltiazem (CARDIZEM) 30 MG tablet TAKE 1 TABLET(30 MG) BY MOUTH EVERY 8 HOURS AS NEEDED (Patient taking differently: Take 30 mg by mouth 3 (three) times daily as needed. Blood pressure) 270 tablet 1  . furosemide (LASIX) 20 MG tablet Take 20 mg by mouth daily.     . hydrOXYzine (ATARAX/VISTARIL) 10 MG tablet Take 10 mg by mouth daily as needed for anxiety or sleep.    [name] Kitchen. ibuprofen (ADVIL) 800 MG tablet Take 1 tablet (800 mg total) by mouth 3 (three) times daily. 30 tablet 0  . losartan-hydrochlorothiazide (HYZAAR) 100-25 MG tablet TAKE 1 TABLET BY MOUTH DAILY 90 tablet 2  . meloxicam (MOBIC) 15 MG tablet Take 15 mg by mouth at bedtime.     . Multiple Vitamin (MULTIVITAMIN WITH MINERALS) TABS tablet Take 1 tablet by mouth daily.    [name] Kitchen. omeprazole (PRILOSEC) 20 MG capsule Take 20 mg by mouth daily.    . Potassium Gluconate 550 MG TABS Take 550 mg by mouth daily.     No current facility-administered medications for this visit.     PHYSICAL EXAMINATION:  ECOG PERFORMANCE STATUS: 0 - Asymptomatic  Vitals:   05/15/20 1353  BP: 121/71  Pulse: 83  Resp: 16  Temp: (!) 96.8 F (36 C)  SpO2: 95%   Filed Weights   05/15/20 1353  Weight: 243 lb (110.2 kg)    GENERAL:alert, no distress and comfortable, obese SKIN: skin color, texture, turgor are  normal, no rashes or significant lesions EYES: normal, conjunctiva are pink and non-injected, sclera clear, obese OROPHARYNX:no exudate, no erythema and lips, buccal mucosa, and tongue normal  NECK: supple, thyroid normal size, non-tender, without nodularity LYMPH:  no palpable lymphadenopathy in the cervical, axillary LUNGS: clear to auscultation and percussion with normal breathing effort HEART: regular rate & rhythm and no murmurs and no lower extremity edema ABDOMEN:abdomen soft, non-tender and normal bowel sounds Musculoskeletal:no cyanosis of digits and no clubbing  PSYCH: alert & oriented x 3 with fluent speech NEURO: no focal motor/sensory deficits  LABORATORY DATA:  I have reviewed the data as listed Lab Results  Component  Value Date   WBC 8.4 05/01/2020   HGB 15.5 (H) 05/01/2020   HCT 49.1 (H) 05/01/2020   MCV 90.6 05/01/2020   PLT 290 05/01/2020     Chemistry      Component Value Date/Time   NA 140 05/01/2020 1358   K 3.4 (L) 05/01/2020 1358   CL 104 05/01/2020 1358   CO2 27 05/01/2020 1358   BUN 19 05/01/2020 1358   CREATININE 0.66 05/01/2020 1358      Component Value Date/Time   CALCIUM 9.6 05/01/2020 1358   ALKPHOS 79 05/01/2020 1358   AST 22 05/01/2020 1358   ALT 22 05/01/2020 1358   BILITOT 0.8 05/01/2020 1358       RADIOGRAPHIC STUDIES: I have personally reviewed the radiological images as listed and agreed with the findings in the report. DG Wrist Complete Right  Result Date: 05/08/2020 X-rays of the right wrist demonstrate an extra-articular fracture, with some dorsal angulation.  X-rays overall remain consistent without interval displacement.  There is some interval consolidation compared to previous x-rays.  Impression: Healing right, extra-articular distal radius fracture; unchanged position  DG Wrist Complete Right  Result Date: 04/24/2020 XR of the right wrist were obtained in clinic today out of the cast and demonstrate maintenance of  alignment.  She continues to have a dorsally angulated joint line, with some loss of radial height.  X-rays remain unchanged compared to previous.  There is evidence of interval consolidation at the fracture site.  Impression: Healing right distal radius fracture, with slight dorsal angulation of the joint line.  DG BONE DENSITY (DXA)  Result Date: 04/22/2020 EXAM: DUAL X-RAY ABSORPTIOMETRY (DXA) FOR BONE MINERAL DENSITY IMPRESSION: Your patient Braedyn Riggle completed a BMD test on 04/22/2020 using the [employer] DXA System (software version: 14.10) manufactured by [employer]. The following summarizes the results of our evaluation. Technologist:TNB PATIENT BIOGRAPHICAL: Name: Juanelle, Trueheart Patient ID: [id number] Birth Date: December 08, 1950 Height: 66.0 in. Gender: Female Exam Date: 04/22/2020 Weight: 242.0 lbs. Indications: Caucasian, Height Loss, History of Fracture (Adult), Parent Hip Fracture, Partial Hysterectomy, Post Menopausal, Unilateral oophrectomy Fractures: Foot, Toe, Wrist Treatments: Calcium, Vitamin D DENSITOMETRY RESULTS: Site         Region     Measured Date Measured Age WHO Classification Young Adult T-score BMD         %Change vs. Previous Significant Change (*) DualFemur Neck Left 04/22/2020 71.9 Osteopenia -2.1 0.741 g/cm2 Left Forearm Radius 33% 04/22/2020 71.9 Osteopenia -1.8 0.589 g/cm2 ASSESSMENT: The BMD measured at Femur Neck Left is 0.741 g/cm2 with a T-score of -2.1. This patient is considered osteopenic according to World Health Organization [facility]) criteria. The scan quality is good. Per official position of the ISCD, it is not possible to quantitatively compare BMD or calculate a LSC between different facilities or devices. Lumbar spine was excluded due to advanced degenerative changes. World Science writer [facility]) criteria for post-menopausal, Caucasian Women: Normal:       T-score at or above -1 SD Osteopenia:   T-score between -1 and -2.5 SD  Osteoporosis: T-score at or below -2.5 SD RECOMMENDATIONS: 1. All patients should optimize calcium and vitamin D intake. 2. Consider FDA-approved medical therapies in postmenopausal women and med aged 47 years and older, based on the following: a. A hip or vertebral (clinical or morphometric) fracture b. T-score< -2.5 at the femoral neck or spine after appropriate evaluation to exclude secondary causes c. Low bone mass (T-score between -1.0 and -2.5 at the femoral neck  or spine) and a 10-year probability of a hip fracture > 3% or a 10-year probability of a major osteoporosis-related fracture > 20% based on the [place]-adapted WHO algorithm d. Clinician judgment and/or patient preferences may indicate treatment for people with 10-year fracture probabilities above or below these levels FOLLOW-UP: People with diagnosed cases of osteoporosis or at high risk for fracture should have regular bone mineral density tests. For patients eligible for Medicare, routine testing is allowed once every 2 years. The testing frequency can be increased to one year for patients who have rapidly progressing disease, those who are receiving or discontinuing medical therapy to restore bone mass, or have additional risk factors. I have reviewed this report, and agree with the above findings. [facility] Your patient Oriya Kettering completed a FRAX assessment on 04/22/2020 using the [employer] DXA System (analysis version: 14.10) manufactured by [employer]. The following summarizes the results of our evaluation. PATIENT BIOGRAPHICAL: Name: Ina, Scrivens Patient ID: [id number] Birth Date: 11-21-1950 Height:    66.0 in. Gender:     Female    [age]       Weight:    242.0 lbs. Ethnicity:  White                            Exam Date: 04/22/2020 FRAX* RESULTS:  (version: 3.5) 10-year Probability of Fracture1 Major Osteoporotic Fracture2 Hip Fracture 18.3% 3.8% Population: [place] (Caucasian) Risk Factors:  History of Fracture (Adult) Based on Femur (Left) Neck BMD 1 -The 10-year probability of fracture may be lower than reported if the patient has received treatment. 2 -Major Osteoporotic Fracture: Clinical Spine, Forearm, Hip or Shoulder *FRAX is a Armed forces logistics/support/administrative officer of the [employer] of [employer] for Metabolic Bone Disease, a World Science writer (WHO) [employer]. ASSESSMENT: The probability of a major osteoporotic fracture is 18.3% within the next ten years. The probability of a hip fracture is 3.8% within the next ten years. Electronically Signed   By: Charlett Nose M.D.   On: 04/22/2020 19:59    All questions were answered. The patient knows to call the clinic with any problems, questions or concerns. I spent 12 minutes in the care of this patient including H and P, review of records, counseling and coordination of care.     Rachel Moulds, MD 05/15/2020 2:11 PM

## 2020-05-21 LAB — JAK2 V617F, W REFLEX TO CALR/E12/MPL

## 2020-05-22 LAB — MISC LABCORP TEST (SEND OUT): Labcorp test code: 489425

## 2020-05-27 ENCOUNTER — Other Ambulatory Visit: Payer: Self-pay

## 2020-05-27 ENCOUNTER — Ambulatory Visit (INDEPENDENT_AMBULATORY_CARE_PROVIDER_SITE_OTHER): Payer: Medicare HMO | Admitting: Orthopedic Surgery

## 2020-05-27 ENCOUNTER — Ambulatory Visit (HOSPITAL_COMMUNITY): Payer: Medicare HMO | Attending: Orthopedic Surgery | Admitting: Occupational Therapy

## 2020-05-27 ENCOUNTER — Encounter: Payer: Self-pay | Admitting: Orthopedic Surgery

## 2020-05-27 ENCOUNTER — Encounter (HOSPITAL_COMMUNITY): Payer: Self-pay | Admitting: Occupational Therapy

## 2020-05-27 DIAGNOSIS — S52531D Colles' fracture of right radius, subsequent encounter for closed fracture with routine healing: Secondary | ICD-10-CM

## 2020-05-27 DIAGNOSIS — G5601 Carpal tunnel syndrome, right upper limb: Secondary | ICD-10-CM

## 2020-05-27 DIAGNOSIS — M25631 Stiffness of right wrist, not elsewhere classified: Secondary | ICD-10-CM | POA: Diagnosis present

## 2020-05-27 DIAGNOSIS — R29898 Other symptoms and signs involving the musculoskeletal system: Secondary | ICD-10-CM | POA: Diagnosis present

## 2020-05-27 DIAGNOSIS — M25531 Pain in right wrist: Secondary | ICD-10-CM | POA: Diagnosis present

## 2020-05-27 MED ORDER — GABAPENTIN 100 MG PO CAPS: 100.0000 mg | ORAL_CAPSULE | Freq: Three times a day (TID) | ORAL | 2 refills | Status: DC

## 2020-05-27 NOTE — Therapy (Signed)
[facility] [address] [place], [place], [address] Phone: [phone]   Fax:  [phone]  Occupational Therapy Evaluation  Patient Details  Name: Kathryn Olsen MRN: [id number] Date of Birth: 12-28-1950 Referring Provider (OT): Dr. Thane Edu   Encounter Date: 05/27/2020   OT End of Session - 05/27/20 1644    Visit Number 1    Number of Visits 6    Date for OT Re-Evaluation 06/11/20    Authorization Type [employer]    Authorization Time Period $10 copay, requesting 6 visits    Authorization - Visit Number 0    Authorization - Number of Visits 6    Progress Note Due on Visit 10    OT Start Time 1518    OT Stop Time 1558    OT Time Calculation (min) 40 min    Activity Tolerance Patient tolerated treatment well    Behavior During Therapy [facility] for tasks assessed/performed           Past Medical History:  Diagnosis Date  . Asthma   . COPD (chronic obstructive pulmonary disease) (HCC)    questionable  . Degeneration of intervertebral disc, site unspecified   . Depression   . DJD (degenerative joint disease)   . Dyslipidemia   . GERD (gastroesophageal reflux disease)   . Pneumonia 2/15  . Shortness of breath    on exertion  . Sleep apnea    stopbang=4  . Unspecified essential hypertension     Past Surgical History:  Procedure Laterality Date  . APPENDECTOMY    . ECTOPIC PREGNANCY SURGERY    . KNEE ARTHROSCOPY  2010    Right knee  . KNEE ARTHROSCOPY  2012   Left knee  . 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 TOTAL KNEE REVISION Left 09/23/2013   Procedure: REVISION LEFT TOTAL KNEE WITH POLY EXCHANGE UPSIZE AND PATELLA REVISION;  Surgeon: Shelda Pal, MD;  Location: [facility];  Service: Orthopedics;  Laterality: Left;    There were no vitals filed for this visit.    Subjective Assessment - 05/27/20 1640    Subjective  S: I fell and my really my whole arm has been hurting.    Pertinent History Pt is a 70 y/o female s/p right distal radius fx on 03/23/20 caused by falling over her dog's bed. Pt hit her whole right arm and reports shoulder pain and popping, feels as if it might dislocate. Also reports elbow pain along ulnar side. Pt wears a wrist brace at night. Pt was referred to occupational therapy for evaluation and treatment by Dr. Thane Edu.    Special Tests DASH: 70.45    Patient Stated Goals To have less pain and more use of my right hand.    Currently in Pain? Yes    Pain Score 5     Pain Location Wrist    Pain Orientation Right    Pain Descriptors / Indicators Throbbing;Numbness    Pain Type Acute pain    Pain Radiating Towards elbow and shoulder    Pain Onset More than a month ago    Pain Frequency Constant    Aggravating Factors  use, holding items    Pain Relieving Factors rest, pain medication    Effect of Pain on Daily Activities mod to max effect on ADLs    Multiple Pain  Sites No             [facility] OT Assessment - 05/27/20 1515      Assessment   Medical Diagnosis s/p right distal radius fx    Referring Provider (OT) Dr. Thane Edu    Onset Date/Surgical Date 03/23/20    Hand Dominance Right    Next MD Visit None scheduled    Prior Therapy None      Precautions   Precautions None      Balance Screen   Has the patient fallen in the past 6 months Yes    How many times? 10+    Has the patient had a decrease in activity level because of a fear of falling?  No    Is the patient reluctant to leave their home because of a fear of falling?  No      Prior Function   Level of Independence Independent    Vocation Retired    Leisure swimming, oil painting      ADL   ADL comments Pt is having difficulty with eating, toilet hygiene, grasping and holding things. Pt drops things frequently, is unable to stir cake batter.       Written Expression   Dominant Hand Right      Cognition   Overall Cognitive Status Within Functional Limits for tasks assessed      Observation/Other Assessments   Focus on Therapeutic Outcomes (FOTO)  70.45      ROM / Strength   AROM / PROM / Strength AROM;PROM;Strength      Palpation   Palpation comment min/mod fascial restrictions along ulnar styloid regions      AROM   AROM Assessment Site Wrist;Forearm;Elbow    Right/Left Elbow Right    Right Elbow Extension 22   left: 20 baseline   Right/Left Forearm Right    Right Forearm Pronation 90 Degrees    Right Forearm Supination 42 Degrees    Right/Left Wrist Right    Right Wrist Extension 10 Degrees    Right Wrist Flexion 30 Degrees    Right Wrist Radial Deviation 10 Degrees    Right Wrist Ulnar Deviation 25 Degrees      PROM   PROM Assessment Site Wrist;Forearm    Right/Left Forearm Right    Right Forearm Pronation 90 Degrees    Right Forearm Supination 42 Degrees    Right/Left Wrist Right    Right Wrist Extension 48 Degrees    Right Wrist Flexion 35 Degrees    Right Wrist Radial Deviation 12 Degrees    Right Wrist Ulnar Deviation 30 Degrees      Strength   Strength Assessment Site Elbow;Forearm;Wrist;Hand    Right/Left Elbow Right    Right Elbow Flexion 5/5    Right Elbow Extension 5/5    Right/Left Forearm Right    Right Forearm Pronation 5/5    Right Forearm Supination 3+/5    Right/Left Wrist Right    Right Wrist Flexion 4-/5    Right Wrist Extension 4+/5    Right Wrist Radial Deviation 4/5    Right Wrist Ulnar Deviation 4/5    Right/Left hand Right;Left    Right Hand Gross Grasp Functional    Right Hand Grip (lbs) 10    Right Hand Lateral Pinch 6 lbs    Right Hand 3 Point Pinch 5 lbs    Left Hand Gross Grasp Functional    Left Hand Grip (lbs) 50    Left Hand Lateral Pinch 9  lbs    Left Hand 3 Point Pinch 14 lbs               Neldon Mc - 05/27/20 1540    Open a tight or new jar Unable    Do  heavy household chores (wash walls, wash floors) Severe difficulty    Carry a shopping bag or briefcase Moderate difficulty    Wash your back Unable    Use a knife to cut food Severe difficulty    Recreational activities in which you take some force or impact through your arm, shoulder, or hand (golf, hammering, tennis) Unable    During the past week, to what extent has your arm, shoulder or hand problem interfered with your normal social activities with family, friends, neighbors, or groups? Modererately    During the past week, to what extent has your arm, shoulder or hand problem limited your work or other regular daily activities Quite a bit    Arm, shoulder, or hand pain. Moderate    Tingling (pins and needles) in your arm, shoulder, or hand Moderate    Difficulty Sleeping Moderate difficulty    DASH Score 70.45 %                      OT Education - 05/27/20 1643    Education Details wrist and hand A/ROM    Person(s) Educated Patient    Methods Explanation;Demonstration;Handout    Comprehension Verbalized understanding;Returned demonstration            OT Short Term Goals - 05/27/20 1649      OT SHORT TERM GOAL #1   Title Pt will be provided with and educated on HEP to improve mobility of RUE required for ADL completion.    Time 3    Period Weeks    Status New    Target Date 06/11/20      OT SHORT TERM GOAL #2   Title Pt will decrease pain in right hand to 4/10 or less to improve ability to sleep for 4+ hours without waking up due to pain.    Time 3    Period Weeks    Status New      OT SHORT TERM GOAL #3   Title Pt will increase right wrist A/ROM to [facility] to improve ability to reach back and perform toilet hygiene.    Time 3    Period Weeks    Status New      OT SHORT TERM GOAL #4   Title Pt will increase right wrist strength and stability to 4+/5 or greater to improve ability to stir thick batters when cooking and baking.    Time 3    Period Weeks     Status New      OT SHORT TERM GOAL #5   Title Pt will increase right grip strength to 25# and pinch strength to 8# or greater to improve ability to hold items when performing ADLs such as grooming, bathing.    Time 3    Period Weeks    Status New      Additional Short Term Goals   Additional Short Term Goals Yes      OT SHORT TERM GOAL #6   Title Pt will be educated on splint wear and care of elbow extension splint.    Time 3    Period Weeks    Status New  Plan - 05/27/20 1645    Clinical Impression Statement A: Pt is a 70 y/o female s/p right distal radius fracture on 03/23/20 after falling over a dog bed. Pt wears prefabricated brace at night, has been squeezing a stress ball and moving her wrist at home. Pt reporting pain, ROM, and strength deficits limiting functional use of RUE. Pt also with numbness and tingling along median nerve distribution-pt reporting it begins at the elbow and goes down to the hand and 2nd-4th digits. Pt reports sleeping in a flexed elbow position and her pain is the most severe in the mornings when she wakes up. Pt is leaving for [place] on 06/13/20.    OT Occupational Profile and History Problem Focused Assessment - Including review of records relating to presenting problem    Occupational performance deficits (Please refer to evaluation for details): ADL's;IADL's;Rest and Sleep;Leisure    Body Structure / Function / Physical Skills ADL;Endurance;UE functional use;Fascial restriction;Pain;ROM;Sensation;IADL;Strength;Edema    Rehab Potential Good    Clinical Decision Making Limited treatment options, no task modification necessary    Comorbidities Affecting Occupational Performance: None    Modification or Assistance to Complete Evaluation  No modification of tasks or assist necessary to complete eval    OT Frequency 2x / week    OT Duration --   3 weeks   OT Treatment/Interventions Self-care/ADL training;Ultrasound;DME and/or AE  instruction;Patient/family education;Paraffin;Passive range of motion;Cryotherapy;Electrical Stimulation;Splinting;Moist Heat;Therapeutic exercise;Manual Therapy;Therapeutic activities    Plan P: Pt will benefit from skilled OT services to decrease pain and fascial restrictions, edema, increase joint ROM, strength, and functional use of RUE. Treatment plan: myofascial release and manual techniques, P/ROM, A/ROM, grip and pinch strengthening, splinting, modalities prn. NEXT SESSION: fabricate nighttime elbow extension splint    OT Home Exercise Plan eval: wrist and hand A/ROM    Consulted and Agree with Plan of Care Patient           Patient will benefit from skilled therapeutic intervention in order to improve the following deficits and impairments:   Body Structure / Function / Physical Skills: ADL,Endurance,UE functional use,Fascial restriction,Pain,ROM,Sensation,IADL,Strength,Edema       Visit Diagnosis: Pain in right wrist  Other symptoms and signs involving the musculoskeletal system  Stiffness of right wrist, not elsewhere classified    Problem List Patient Active Problem List   Diagnosis Date Noted  . PSVT (paroxysmal supraventricular tachycardia) (HCC) 12/19/2019  . Sinus pause 12/19/2019  . Pulmonary hypertension, unspecified (HCC) 11/28/2019  . Irregular heart beat 10/14/2019  . Dizziness 10/14/2019  . Asthma, chronic 11/29/2013  . Other malaise and fatigue 11/29/2013  . Shortness of breath 10/28/2013  . Obesity, unspecified 10/28/2013  . S/P left knee revision 09/23/2013  . Exertional dyspnea 08/09/2013  . Preoperative cardiovascular examination 08/09/2013  . Pre-operative cardiovascular examination 08/09/2013  . Chest pain 08/09/2013  . Dyspnea 08/09/2013  . Essential hypertension 08/09/2013  . Nonspecific (abnormal) findings on radiological and other examination of gastrointestinal tract 04/26/2011  . Esophageal reflux 04/26/2011  . Chest pain, unspecified  04/26/2011  . Special screening for malignant neoplasms, colon 04/26/2011  . DYSLIPIDEMIA 09/03/2008  . Malignant hypertension 09/03/2008  . DEGENERATIVE DISC DISEASE 09/03/2008   Ezra SitesLeslie Samantha Ragen, OTR/L  [phone] 05/27/2020, 4:53 PM  [place] [facility] [address] [place], [place], [address] Phone: [phone]   Fax:  [phone]  Name: Kathryn Olsen MRN: [id number] Date of Birth: 06-Jul-1950

## 2020-05-27 NOTE — Patient Instructions (Signed)
  AROM Exercises:  *Complete exercises ____10-15__ times each, ___2-3____ times per day*  1) Wrist Flexion  Start with wrist at edge of table, palm facing up. With wrist hanging slightly off table, curl wrist upward, and back down.      2) Wrist Extension  Start with wrist at edge of table, palm facing down. With wrist slightly off the edge of the table, curl wrist up and back down.      3) Radial Deviations  Start with forearm flat against a table, wrist hanging slightly off the edge, and palm facing the wall. Bending at the wrist only, and keeping palm facing the wall, bend wrist so fist is pointing towards the floor, back up to start position, and up towards the ceiling. Return to start.        4) WRIST PRONATION  Turn your forearm towards palm face down.  Keep your elbow bent and by the side of your  Body.      5) WRIST SUPINATION  Turn your forearm towards palm face up.  Keep your elbow bent and by the side of your  Body.      Finger/Hand Exercises Complete each exercise 10-15X, 2-3X/day  1) Towel crunch Place a small towel on a firm table top. Flatten out the towel and then place your hand on one end of it.  Next, flex your fingers 2-5 (index finger through pinky finger) as you pull the towel towards your hand.    2) Digit composite flexion/adduction (make a fist) Hold your hand up as shown. Open and close your hand into a fist and repeat. If you cannot make a full fist, then make a partial fist.      3) Finger Taps Start with the hand flat and fingers slightly spread.  One at a time, starting with the thumb, lift each finger up separately.      4) Digit Abduction/Adduction Hold hand palm down flat on table. Spread your fingers apart and back together.

## 2020-05-28 ENCOUNTER — Encounter (HOSPITAL_COMMUNITY): Payer: Self-pay | Admitting: Occupational Therapy

## 2020-05-28 ENCOUNTER — Ambulatory Visit (HOSPITAL_COMMUNITY): Payer: Medicare HMO | Admitting: Occupational Therapy

## 2020-05-28 DIAGNOSIS — M25531 Pain in right wrist: Secondary | ICD-10-CM

## 2020-05-28 DIAGNOSIS — M25631 Stiffness of right wrist, not elsewhere classified: Secondary | ICD-10-CM

## 2020-05-28 DIAGNOSIS — R29898 Other symptoms and signs involving the musculoskeletal system: Secondary | ICD-10-CM

## 2020-05-28 NOTE — Progress Notes (Signed)
Orthopaedic Clinic Return  Assessment: Kathryn Olsen is a 70 y.o. female with the following  Right distal radius fracture, extra-articular; nonoperative management  Plan: Kathryn Olsen sustained a right distal radius fracture.  She was treated without surgery, and is excepting of a mild deformity about the wrist.  Approximately 2 weeks ago, her cast was removed, and we transitioned her to a removable wrist brace.  Since then, she has been having persistent numbness and tingling in the median nerve distribution, as well as pain into her right thumb.  Based on my physical exam, there is irritation of the median nerve.  Her range of motion is gradually improving, and she still has some strength limitations, primarily related to her recent injury.  She plans to travel to [place] within the next few weeks, and will be there for a month.  We discussed multiple options moving forward.  We will have her evaluated by occupational therapy, to improve her range of motion, and potentially improve the irritation of the median nerve.  I have also provided her with a prescription for gabapentin, which can hopefully help with the nerve type pain she is experiencing.  Her to continue to communicate with our clinic, including when she is away in [place].  She stated her understanding.  Depending on her improvements over the next 4-6 weeks, we may have to consider obtaining an EMG for further evaluation.  All questions were answered, and she is amenable to this plan.  Follow-up: Return if symptoms worsen or fail to improve.   Subjective:  Chief Complaint  Patient presents with  . Hand Pain    Right hand, patient reports having more pain and numbness,     History of Present Illness: Kathryn Olsen is a 70 y.o. RHD female who returns for repeat evaluation of her right wrist.  She sustained a right distal radius fracture that we treated without surgery.  She has been out of her cast for  approximately 2-3 weeks.  She has been using a removable wrist brace and this time.  Shortly after transition to the brace, she noted numbness, tingling and pain into her right hand.  She has modified her brace wear, and this has improved her symptoms a little bit.  However, she continues to have issues.  In particular, the numbness and tingling is aggravated, but she also notes occasional shooting pains into her right thumb.  She has had no additional injury recently.  She is taking over-the-counter pain medications occasionally.  Review of Systems: No fevers or chills + numbness and tingling No chest pain No shortness of breath No bowel or bladder dysfunction No GI distress No headaches   Objective: There were no vitals taken for this visit.  Physical Exam:  Evaluation of the right wrist demonstrates persistent swelling.  She has a mild deformity Olsen.  Extension limited to approximately 30 degrees.  She has difficulty extending her fingers with extension to 30 degrees.  Positive Tinel's proximal to the prominent wrist crease.  Positive carpal tunnel compression test.  IMAGING: I personally ordered and reviewed the following images:  No new imaging obtained today.   Oliver Barre, MD 05/28/2020 9:17 AM

## 2020-05-28 NOTE — Therapy (Signed)
[facility] [address] [place], [place], [address] Phone: [phone]   Fax:  [phone]  Occupational Therapy Treatment  Patient Details  Name: Kathryn Olsen MRN: [id number] Date of Birth: 18-Mar-1950 Referring Provider (OT): Dr. Thane Edu   Encounter Date: 05/28/2020   OT End of Session - 05/28/20 0955    Visit Number 2    Number of Visits 6    Date for OT Re-Evaluation 06/11/20    Authorization Type [employer]    Authorization Time Period $10 copay, requesting 6 visits    Authorization - Visit Number 0    Authorization - Number of Visits 6    Progress Note Due on Visit 10    OT Start Time 0902    OT Stop Time 0946    OT Time Calculation (min) 44 min    Activity Tolerance Patient tolerated treatment well    Behavior During Therapy [facility] for tasks assessed/performed           Past Medical History:  Diagnosis Date  . Asthma   . COPD (chronic obstructive pulmonary disease) (HCC)    questionable  . Degeneration of intervertebral disc, site unspecified   . Depression   . DJD (degenerative joint disease)   . Dyslipidemia   . GERD (gastroesophageal reflux disease)   . Pneumonia 2/15  . Shortness of breath    on exertion  . Sleep apnea    stopbang=4  . Unspecified essential hypertension     Past Surgical History:  Procedure Laterality Date  . APPENDECTOMY    . ECTOPIC PREGNANCY SURGERY    . KNEE ARTHROSCOPY  2010    Right knee  . KNEE ARTHROSCOPY  2012   Left knee  . 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 TOTAL KNEE REVISION Left 09/23/2013   Procedure: REVISION LEFT TOTAL KNEE WITH POLY EXCHANGE UPSIZE AND PATELLA REVISION;  Surgeon: Shelda Pal, MD;  Location: [facility];  Service: Orthopedics;  Laterality: Left;    There were no vitals filed for this visit.   Subjective  Assessment - 05/28/20 0905    Subjective  S: They started me on Gabepentin yesterday.    Currently in Pain? Yes    Pain Score 2     Pain Location Hand    Pain Orientation Right    Pain Type Acute pain    Pain Radiating Towards elbow    Pain Onset More than a month ago    Pain Frequency Constant    Aggravating Factors  use    Pain Relieving Factors rest, pain medication    Effect of Pain on Daily Activities mod to max effect on ADLs    Multiple Pain Sites No              [facility] OT Assessment - 05/28/20 0904      Assessment   Medical Diagnosis s/p right distal radius fx      Precautions   Precautions None                    OT Treatments/Exercises (OP) - 05/28/20 0947      Exercises   Exercises Wrist;Hand      Wrist Exercises   Wrist Flexion PROM;5 reps;AROM;10 reps    Wrist Extension PROM;5 reps;AROM;10 reps    Wrist Radial Deviation PROM;5 reps;AROM;10 reps  Wrist Ulnar Deviation PROM;5 reps;AROM;10 reps    Other wrist exercises forearm supination/pronation, P/ROM 5x, A/ROM 10x      Splinting   Splinting Nighttime elbow extension splint fabricated for right elbow to limit flexion and nerve stretching at night.      Manual Therapy   Manual Therapy Soft tissue mobilization    Manual therapy comments completed separately from therapeutic exercises    Soft tissue mobilization myofascial release and manual techniques to right dorsal and volar wrist, hand, and palm to decrease fascial restrictions and muscle tightness and increase joint ROM                    OT Short Term Goals - 05/28/20 0957      OT SHORT TERM GOAL #1   Title Pt will be provided with and educated on HEP to improve mobility of RUE required for ADL completion.    Time 3    Period Weeks    Status On-going    Target Date 06/11/20      OT SHORT TERM GOAL #2   Title Pt will decrease pain in right hand to 4/10 or less to improve ability to sleep for 4+ hours without waking up  due to pain.    Time 3    Period Weeks    Status On-going      OT SHORT TERM GOAL #3   Title Pt will increase right wrist A/ROM to [facility] to improve ability to reach back and perform toilet hygiene.    Time 3    Period Weeks    Status On-going      OT SHORT TERM GOAL #4   Title Pt will increase right wrist strength and stability to 4+/5 or greater to improve ability to stir thick batters when cooking and baking.    Time 3    Period Weeks    Status On-going      OT SHORT TERM GOAL #5   Title Pt will increase right grip strength to 25# and pinch strength to 8# or greater to improve ability to hold items when performing ADLs such as grooming, bathing.    Time 3    Period Weeks    Status On-going      OT SHORT TERM GOAL #6   Title Pt will be educated on splint wear and care of elbow extension splint.    Time 3    Period Weeks    Status On-going                    Plan - 05/28/20 0955    Clinical Impression Statement A: Pt reports she was able to make a full fist this morning. Fabricated nighttime elbow extension splint this session. Initiated myofascial release and passive stretching, wrist A/ROM. Pt with improved tolerance for passive stretching with greater ROM than on eval yesterday. Also with increased range during A/ROM. Pt educated on splint wear and care, instructed to bring back for adjustments as needed. Verbal cuing for form and technique during exercises.    Body Structure / Function / Physical Skills ADL;Endurance;UE functional use;Fascial restriction;Pain;ROM;Sensation;IADL;Strength;Edema    Plan P: Follow up on elbow splint use at night, continue with manual techniques and wrist A/ROM, begin gentle grip and pinch tasks    OT Home Exercise Plan eval: wrist and hand A/ROM; 3/17: splint wear and care    Consulted and Agree with Plan of Care Patient  Patient will benefit from skilled therapeutic intervention in order to improve the following deficits  and impairments:   Body Structure / Function / Physical Skills: ADL,Endurance,UE functional use,Fascial restriction,Pain,ROM,Sensation,IADL,Strength,Edema       Visit Diagnosis: Pain in right wrist  Other symptoms and signs involving the musculoskeletal system  Stiffness of right wrist, not elsewhere classified    Problem List Patient Active Problem List   Diagnosis Date Noted  . PSVT (paroxysmal supraventricular tachycardia) (HCC) 12/19/2019  . Sinus pause 12/19/2019  . Pulmonary hypertension, unspecified (HCC) 11/28/2019  . Irregular heart beat 10/14/2019  . Dizziness 10/14/2019  . Asthma, chronic 11/29/2013  . Other malaise and fatigue 11/29/2013  . Shortness of breath 10/28/2013  . Obesity, unspecified 10/28/2013  . S/P left knee revision 09/23/2013  . Exertional dyspnea 08/09/2013  . Preoperative cardiovascular examination 08/09/2013  . Pre-operative cardiovascular examination 08/09/2013  . Chest pain 08/09/2013  . Dyspnea 08/09/2013  . Essential hypertension 08/09/2013  . Nonspecific (abnormal) findings on radiological and other examination of gastrointestinal tract 04/26/2011  . Esophageal reflux 04/26/2011  . Chest pain, unspecified 04/26/2011  . Special screening for malignant neoplasms, colon 04/26/2011  . DYSLIPIDEMIA 09/03/2008  . Malignant hypertension 09/03/2008  . DEGENERATIVE DISC DISEASE 09/03/2008   Ezra Sites, OTR/L  [phone] 05/28/2020, 10:00 AM  [facility] [address] [place], [place], [address] Phone: [phone]   Fax:  [phone]  Name: Kathryn Olsen MRN: [id number] Date of Birth: 09/22/1950

## 2020-06-01 ENCOUNTER — Encounter (HOSPITAL_COMMUNITY): Payer: Self-pay

## 2020-06-01 ENCOUNTER — Other Ambulatory Visit: Payer: Self-pay

## 2020-06-01 ENCOUNTER — Ambulatory Visit (HOSPITAL_COMMUNITY): Payer: Medicare HMO

## 2020-06-01 DIAGNOSIS — M25531 Pain in right wrist: Secondary | ICD-10-CM

## 2020-06-01 DIAGNOSIS — M25631 Stiffness of right wrist, not elsewhere classified: Secondary | ICD-10-CM

## 2020-06-01 DIAGNOSIS — R29898 Other symptoms and signs involving the musculoskeletal system: Secondary | ICD-10-CM

## 2020-06-01 NOTE — Therapy (Signed)
[facility] [address] [place], [place], [address] Phone: [phone]   Fax:  [phone]  Occupational Therapy Treatment  Patient Details  Name: Kathryn Olsen MRN: [id number] Date of Birth: May 25, 1950 Referring Provider (OT): Dr. Thane Edu   Encounter Date: 06/01/2020   OT End of Session - 06/01/20 1012    Visit Number 3    Number of Visits 6    Date for OT Re-Evaluation 06/11/20    Authorization Type [employer]    Authorization Time Period $10 copay, [employer] approved 6 visits.    Authorization - Visit Number 2    Authorization - Number of Visits 6    Progress Note Due on Visit 10    OT Start Time [phone]    OT Stop Time 0940    OT Time Calculation (min) 35 min    Activity Tolerance Patient tolerated treatment well    Behavior During Therapy [facility] for tasks assessed/performed           Past Medical History:  Diagnosis Date  . Asthma   . COPD (chronic obstructive pulmonary disease) (HCC)    questionable  . Degeneration of intervertebral disc, site unspecified   . Depression   . DJD (degenerative joint disease)   . Dyslipidemia   . GERD (gastroesophageal reflux disease)   . Pneumonia 2/15  . Shortness of breath    on exertion  . Sleep apnea    stopbang=4  . Unspecified essential hypertension     Past Surgical History:  Procedure Laterality Date  . APPENDECTOMY    . ECTOPIC PREGNANCY SURGERY    . KNEE ARTHROSCOPY  2010    Right knee  . KNEE ARTHROSCOPY  2012   Left knee  . 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 TOTAL KNEE REVISION Left 09/23/2013   Procedure: REVISION LEFT TOTAL KNEE WITH POLY EXCHANGE UPSIZE AND PATELLA REVISION;  Surgeon: Shelda Pal, MD;  Location: [facility];  Service: Orthopedics;  Laterality: Left;    There were no vitals filed for this visit.    Subjective Assessment - 06/01/20 0907    Subjective  S: I had to take the splint off half way through the night. It was pressing into the side of my arm.    Currently in Pain? Yes    Pain Score 4     Pain Location Hand    Pain Orientation Right    Pain Descriptors / Indicators Sore    Pain Type Acute pain    Pain Onset More than a month ago    Pain Frequency Constant    Aggravating Factors  increased use    Pain Relieving Factors valtern cream, rest, over the counter pain medication    Effect of Pain on Daily Activities mod to max effect    Multiple Pain Sites No              [facility] OT Assessment - 06/01/20 0925      Assessment   Medical Diagnosis s/p right distal radius fx      Precautions   Precautions None                    OT Treatments/Exercises (OP) - 06/01/20 0925      Exercises   Exercises Wrist;Hand      Wrist Exercises   Other  wrist exercises forearm supination/pronation, P/ROM,A/ROM 10X    Other wrist exercises Using hard sponges, patient kept right forearm on table while stacking sponges into a tower 3 high. Complete 6 towers total focusing on wrist extension.      Additional Wrist Exercises   Sponges 12, 11      Hand Exercises   Other Hand Exercises open/close first 10X A/ROM      Manual Therapy   Manual Therapy Soft tissue mobilization    Manual therapy comments completed separately from therapeutic exercises    Soft tissue mobilization myofascial release and manual techniques to right dorsal and volar wrist, hand, and palm to decrease fascial restrictions and muscle tightness and increase joint ROM                    OT Short Term Goals - 05/28/20 0957      OT SHORT TERM GOAL #1   Title Pt will be provided with and educated on HEP to improve mobility of RUE required for ADL completion.    Time 3    Period Weeks    Status On-going    Target Date 06/11/20      OT SHORT TERM GOAL #2   Title Pt will decrease pain in right  hand to 4/10 or less to improve ability to sleep for 4+ hours without waking up due to pain.    Time 3    Period Weeks    Status On-going      OT SHORT TERM GOAL #3   Title Pt will increase right wrist A/ROM to [facility] to improve ability to reach back and perform toilet hygiene.    Time 3    Period Weeks    Status On-going      OT SHORT TERM GOAL #4   Title Pt will increase right wrist strength and stability to 4+/5 or greater to improve ability to stir thick batters when cooking and baking.    Time 3    Period Weeks    Status On-going      OT SHORT TERM GOAL #5   Title Pt will increase right grip strength to 25# and pinch strength to 8# or greater to improve ability to hold items when performing ADLs such as grooming, bathing.    Time 3    Period Weeks    Status On-going      OT SHORT TERM GOAL #6   Title Pt will be educated on splint wear and care of elbow extension splint.    Time 3    Period Weeks    Status On-going                    Plan - 06/01/20 1013    Clinical Impression Statement A: Patient with max fascial restrictions palpated in the right forarm, wrist, and palm and manual techniques were completed to address. Patient had great response to manual techniques with fascial restrictions actively releasing. Continues to express numbness in ring and small finger of right hand during exercises. Used sponges to complete gentle grip and pinch activity. VC for form and technique.    Body Structure / Function / Physical Skills ADL;Endurance;UE functional use;Fascial restriction;Pain;ROM;Sensation;IADL;Strength;Edema    Plan P: Make splint adjustments. Continue with manual techniques, A/ROM of the wrist, gentle pinch and grip tasks.    Consulted and Agree with Plan of Care Patient           Patient will benefit from skilled therapeutic  intervention in order to improve the following deficits and impairments:   Body Structure / Function / Physical Skills:  ADL,Endurance,UE functional use,Fascial restriction,Pain,ROM,Sensation,IADL,Strength,Edema       Visit Diagnosis: Stiffness of right wrist, not elsewhere classified  Other symptoms and signs involving the musculoskeletal system  Pain in right wrist    Problem List Patient Active Problem List   Diagnosis Date Noted  . PSVT (paroxysmal supraventricular tachycardia) (HCC) 12/19/2019  . Sinus pause 12/19/2019  . Pulmonary hypertension, unspecified (HCC) 11/28/2019  . Irregular heart beat 10/14/2019  . Dizziness 10/14/2019  . Asthma, chronic 11/29/2013  . Other malaise and fatigue 11/29/2013  . Shortness of breath 10/28/2013  . Obesity, unspecified 10/28/2013  . S/P left knee revision 09/23/2013  . Exertional dyspnea 08/09/2013  . Preoperative cardiovascular examination 08/09/2013  . Pre-operative cardiovascular examination 08/09/2013  . Chest pain 08/09/2013  . Dyspnea 08/09/2013  . Essential hypertension 08/09/2013  . Nonspecific (abnormal) findings on radiological and other examination of gastrointestinal tract 04/26/2011  . Esophageal reflux 04/26/2011  . Chest pain, unspecified 04/26/2011  . Special screening for malignant neoplasms, colon 04/26/2011  . DYSLIPIDEMIA 09/03/2008  . Malignant hypertension 09/03/2008  . DEGENERATIVE DISC DISEASE 09/03/2008    Limmie Patricia, OTR/L,CBIS  [phone]  06/01/2020, 10:52 AM  [facility] [address] [place], [place], [address] Phone: [phone]   Fax:  [phone]  Name: Kathryn Olsen MRN: [id number] Date of Birth: Dec 22, 1950

## 2020-06-03 ENCOUNTER — Ambulatory Visit (HOSPITAL_COMMUNITY): Payer: Medicare HMO

## 2020-06-03 ENCOUNTER — Other Ambulatory Visit: Payer: Self-pay

## 2020-06-03 ENCOUNTER — Encounter (HOSPITAL_COMMUNITY): Payer: Self-pay

## 2020-06-03 DIAGNOSIS — M25531 Pain in right wrist: Secondary | ICD-10-CM | POA: Diagnosis not present

## 2020-06-03 DIAGNOSIS — M25631 Stiffness of right wrist, not elsewhere classified: Secondary | ICD-10-CM

## 2020-06-03 DIAGNOSIS — R29898 Other symptoms and signs involving the musculoskeletal system: Secondary | ICD-10-CM

## 2020-06-03 NOTE — Therapy (Signed)
[facility] [address] [place], [place], [address] Phone: [phone]   Fax:  [phone]  Occupational Therapy Treatment  Patient Details  Name: Kathryn Olsen MRN: [id number] Date of Birth: Aug 26, 1950 Referring Provider (OT): Dr. Thane Edu   Encounter Date: 06/03/2020   OT End of Session - 06/03/20 1108    Visit Number 4    Number of Visits 6    Date for OT Re-Evaluation 06/11/20    Authorization Type [employer]    Authorization Time Period $10 copay, [employer] approved 6 visits.    Authorization - Visit Number 3    Authorization - Number of Visits 6    Progress Note Due on Visit 10    OT Start Time 0820   checked in late   OT Stop Time 0855    OT Time Calculation (min) 35 min    Activity Tolerance Patient limited by pain    Behavior During Therapy [facility] for tasks assessed/performed           Past Medical History:  Diagnosis Date  . Asthma   . COPD (chronic obstructive pulmonary disease) (HCC)    questionable  . Degeneration of intervertebral disc, site unspecified   . Depression   . DJD (degenerative joint disease)   . Dyslipidemia   . GERD (gastroesophageal reflux disease)   . Pneumonia 2/15  . Shortness of breath    on exertion  . Sleep apnea    stopbang=4  . Unspecified essential hypertension     Past Surgical History:  Procedure Laterality Date  . APPENDECTOMY    . ECTOPIC PREGNANCY SURGERY    . KNEE ARTHROSCOPY  2010    Right knee  . KNEE ARTHROSCOPY  2012   Left knee  . 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 TOTAL KNEE REVISION Left 09/23/2013   Procedure: REVISION LEFT TOTAL KNEE WITH POLY EXCHANGE UPSIZE AND PATELLA REVISION;  Surgeon: Shelda Pal, MD;  Location: [facility];  Service: Orthopedics;  Laterality: Left;    There were no vitals filed for this  visit.   Subjective Assessment - 06/03/20 0824    Subjective  S: I did a lot of yardwork yesterday. I may have done too much.    Currently in Pain? Yes    Pain Score 2     Pain Location Hand    Pain Orientation Right    Pain Descriptors / Indicators Sore    Pain Type Acute pain    Pain Onset More than a month ago    Pain Frequency Constant    Aggravating Factors  increased use    Pain Relieving Factors valtarn cream, rest, over the counter pain medication    Effect of Pain on Daily Activities mod to max effect    Multiple Pain Sites No              [facility] OT Assessment - 06/03/20 0825      Assessment   Medical Diagnosis s/p right distal radius fx      Precautions   Precautions None                    OT Treatments/Exercises (OP) - 06/03/20 0834      Exercises   Exercises Wrist;Hand      Weighted Stretch Over Towel Roll   Wrist Flexion -  Weighted Stretch 1 pound;30 seconds    Wrist Extension - Weighted Stretch 1 pound;30 seconds    Radial Deviation - Weighted Stretch 1 pound;30 seconds      Wrist Exercises   Wrist Flexion PROM;5 reps    Wrist Extension PROM;5 reps    Other wrist exercises A/ROM with half blue wedge on table wrist flexion (within available range), extension, supination/pronation, radial deviation 10X      Additional Wrist Exercises   Sponges 13, 15    Hand Gripper with Large Beads 2 beads completed with gripper set at 7# vertical   terminated task due to pain level   Hand Gripper with Medium Beads 5 beads completed with gripper set at 7# vertical   terminated due to pain     Hand Exercises   Other Hand Exercises open/close first 10X A/ROM   hold open for 2" and close for 2"                   OT Short Term Goals - 05/28/20 0957      OT SHORT TERM GOAL #1   Title Pt will be provided with and educated on HEP to improve mobility of RUE required for ADL completion.    Time 3    Period Weeks    Status On-going    Target Date  06/11/20      OT SHORT TERM GOAL #2   Title Pt will decrease pain in right hand to 4/10 or less to improve ability to sleep for 4+ hours without waking up due to pain.    Time 3    Period Weeks    Status On-going      OT SHORT TERM GOAL #3   Title Pt will increase right wrist A/ROM to [facility] to improve ability to reach back and perform toilet hygiene.    Time 3    Period Weeks    Status On-going      OT SHORT TERM GOAL #4   Title Pt will increase right wrist strength and stability to 4+/5 or greater to improve ability to stir thick batters when cooking and baking.    Time 3    Period Weeks    Status On-going      OT SHORT TERM GOAL #5   Title Pt will increase right grip strength to 25# and pinch strength to 8# or greater to improve ability to hold items when performing ADLs such as grooming, bathing.    Time 3    Period Weeks    Status On-going      OT SHORT TERM GOAL #6   Title Pt will be educated on splint wear and care of elbow extension splint.    Time 3    Period Weeks    Status On-going                    Plan - 06/03/20 1108    Clinical Impression Statement A: Limited by pain during session although communication on level was difficult. Patient appears to push through the pain and at one point was tearful. Education provided on staying within her pain tolerance and the importance of communicating when something was causing increased pain. The goal of therapy is not to cause more pain. At this time, pain is the biggest deficit to progressing ROM and strength. Focused on P/ROM stretching and gentle grip strength using sponges. Handgripper activity was attempted although due to pain it was discountinued. Recommended applying ice  when returning home and take something for pain after session. Elbow splint was not brought to session.    Body Structure / Function / Physical Skills ADL;Endurance;UE functional use;Fascial restriction;Pain;ROM;Sensation;IADL;Strength;Edema     Plan P: Make splint adjustments. Continue with manual techniques, A/ROM of the wrist, gentle pinch and grip tasks. Continue with providing education as needed regarding pain level and pain tolerance as needed.    OT Home Exercise Plan eval: wrist and hand A/ROM; 3/17: splint wear and care 3/23: prayer stretch    Consulted and Agree with Plan of Care Patient           Patient will benefit from skilled therapeutic intervention in order to improve the following deficits and impairments:   Body Structure / Function / Physical Skills: ADL,Endurance,UE functional use,Fascial restriction,Pain,ROM,Sensation,IADL,Strength,Edema       Visit Diagnosis: Other symptoms and signs involving the musculoskeletal system  Stiffness of right wrist, not elsewhere classified  Pain in right wrist    Problem List Patient Active Problem List   Diagnosis Date Noted  . PSVT (paroxysmal supraventricular tachycardia) (HCC) 12/19/2019  . Sinus pause 12/19/2019  . Pulmonary hypertension, unspecified (HCC) 11/28/2019  . Irregular heart beat 10/14/2019  . Dizziness 10/14/2019  . Asthma, chronic 11/29/2013  . Other malaise and fatigue 11/29/2013  . Shortness of breath 10/28/2013  . Obesity, unspecified 10/28/2013  . S/P left knee revision 09/23/2013  . Exertional dyspnea 08/09/2013  . Preoperative cardiovascular examination 08/09/2013  . Pre-operative cardiovascular examination 08/09/2013  . Chest pain 08/09/2013  . Dyspnea 08/09/2013  . Essential hypertension 08/09/2013  . Nonspecific (abnormal) findings on radiological and other examination of gastrointestinal tract 04/26/2011  . Esophageal reflux 04/26/2011  . Chest pain, unspecified 04/26/2011  . Special screening for malignant neoplasms, colon 04/26/2011  . DYSLIPIDEMIA 09/03/2008  . Malignant hypertension 09/03/2008  . DEGENERATIVE DISC DISEASE 09/03/2008    Limmie Patricia, OTR/L,CBIS  [phone]  06/03/2020, 11:14 AM  [facility] [address] [place], [place], [address] Phone: [phone]   Fax:  [phone]  Name: Kathryn Olsen MRN: [id number] Date of Birth: 26-Jul-1950

## 2020-06-03 NOTE — Patient Instructions (Signed)
PRAYER STRETCH - WRIST  Place the palms of your hands together with your fingers pointed upwards. Then lower your hands in front of your chest as shown to stretch your wrists. Hold for 20-30 seconds. Complete 2 times.  Do not push into the pain. Stop once a stretch is felt.

## 2020-06-09 ENCOUNTER — Telehealth: Payer: Self-pay | Admitting: Pharmacist

## 2020-06-09 ENCOUNTER — Other Ambulatory Visit: Payer: Self-pay | Admitting: Orthopedic Surgery

## 2020-06-09 ENCOUNTER — Ambulatory Visit (HOSPITAL_COMMUNITY): Payer: Medicare HMO

## 2020-06-09 ENCOUNTER — Other Ambulatory Visit: Payer: Self-pay

## 2020-06-09 DIAGNOSIS — M25631 Stiffness of right wrist, not elsewhere classified: Secondary | ICD-10-CM

## 2020-06-09 DIAGNOSIS — M25531 Pain in right wrist: Secondary | ICD-10-CM | POA: Diagnosis not present

## 2020-06-09 DIAGNOSIS — R29898 Other symptoms and signs involving the musculoskeletal system: Secondary | ICD-10-CM

## 2020-06-09 NOTE — Telephone Encounter (Signed)
CARE PLAN ENTRY  06/09/2020 Name: Kathryn Olsen MRN: [id number] DOB: 28-Aug-1950  Kathryn Olsen is enrolled in Remote Patient Monitoring/Principle Care Monitoring.  Date of Enrollment: 10/14/19 Supervising physician: Truett Mainland Indication: HTN  Remote Readings: Not Compliant  Next scheduled OV: Not Scheduled   Pharmacist Clinical Goal(s):  [name] Kitchen Over the next 90 days, patient will demonstrate Improved medication adherence as evidenced by medication fill history . Over the next 90 days, patient will demonstrate improved understanding of prescribed medications and rationale for usage as evidenced by patient teach back . Over the next 90 days, patient will experience decrease in ED visits. ED visits in last 6 months = 2 (wrist pain - 03/23/20), 02/05/20 (palpitation) . Over the next 90 days, patient will not experience hospital admission. Hospital Admissions in last 6 months = 0  Interventions: . Provider and Inter-disciplinary care team collaboration (see longitudinal plan of care) . Comprehensive medication review performed. . Discussed plans with patient for ongoing care management follow up and provided patient with direct contact information for care management team . Collaboration with provider re: medication management  Patient Self Care Activities:  . Self administers medications as prescribed . Attends all scheduled provider appointments . Performs ADL's independently . Performs IADL's independently  Allergies  Allergen Reactions  . Buprenorphine Hcl Anaphylaxis and Itching  . Ace Inhibitors Diarrhea   Outpatient Encounter Medications as of 06/09/2020  Medication Sig Note  . albuterol (PROVENTIL HFA;VENTOLIN HFA) 108 (90 Base) MCG/ACT inhaler Inhale 1-2 puffs into the lungs every 6 (six) hours as needed for wheezing or shortness of breath.   [name] Kitchen amLODipine (NORVASC) 5 MG tablet Take 5 mg by mouth every evening.   [name] Kitchen aspirin EC 81 MG tablet Take 81 mg by  mouth daily. Swallow whole.   [name] Kitchen atorvastatin (LIPITOR) 10 MG tablet Take 10 mg by mouth daily.   . budesonide-formoterol (SYMBICORT) 80-4.5 MCG/ACT inhaler Inhale 2 puffs into the lungs 2 (two) times daily.   . Calcium Carb-Cholecalciferol (CALCIUM 500/D PO) Take by mouth.   . diltiazem (CARDIZEM) 30 MG tablet TAKE 1 TABLET(30 MG) BY MOUTH EVERY 8 HOURS AS NEEDED (Patient taking differently: Take 30 mg by mouth 3 (three) times daily as needed. Blood pressure)   . furosemide (LASIX) 20 MG tablet Take 20 mg by mouth daily.    [name] Kitchen gabapentin (NEURONTIN) 100 MG capsule Take 1 capsule (100 mg total) by mouth 3 (three) times daily.   . hydrOXYzine (ATARAX/VISTARIL) 10 MG tablet Take 10 mg by mouth daily as needed for anxiety or sleep. 03/09/2020: On hand  . ibuprofen (ADVIL) 800 MG tablet Take 1 tablet (800 mg total) by mouth 3 (three) times daily.   [name] Kitchen losartan-hydrochlorothiazide (HYZAAR) 100-25 MG tablet TAKE 1 TABLET BY MOUTH DAILY   . meloxicam (MOBIC) 15 MG tablet Take 15 mg by mouth at bedtime.    . Multiple Vitamin (MULTIVITAMIN WITH MINERALS) TABS tablet Take 1 tablet by mouth daily.   [name] Kitchen omeprazole (PRILOSEC) 20 MG capsule Take 20 mg by mouth daily. 03/09/2020: On hold until patient purchases more  . Potassium Gluconate 550 MG TABS Take 550 mg by mouth daily.    No facility-administered encounter medications on file as of 06/09/2020.    Hypertension   BP goal is:  <130/80  Office blood pressures are  BP Readings from Last 3 Encounters:  05/15/20 121/71  05/01/20 (!) 132/93  03/27/20 (!) 157/102    Patient is currently controlled on the following medications: amlodipine 5 mg,  diltiazem 30 mg, lasix 20 mg, losartan-HCTZ 100/25 mg  Patient checks BP at home infrequently  Patient home BP readings are ranging: N/A  Patient has tried  these meds in the past: olmesartan-HCTZ, lisinopril-HCTZ, clonidine 0.2 mg, amlodipine-olmesartan, HCTZ,   We discussed diet and exercise  extensively  Plan  Continue current medications   Recent wrist fracture. Actively participating in OT.  ______________ Visit Information SDOH (Social Determinants of Health) assessments performed: Yes.  Kathryn Olsen was given information about Principle Care Management/Remote Patient Monitoring services today including:  1. RPM/PCM service includes personalized support from designated clinical staff supervised by her physician, including individualized plan of care and coordination with other care providers 2. 24/7 contact phone numbers for assistance for urgent and routine care needs. 3. Standard insurance, coinsurance, copays and deductibles apply for principle care management only during months in which we provide at least 30 minutes of these services. Most insurances cover these services at 100%, however patients may be responsible for any copay, coinsurance and/or deductible if applicable. This service may help you avoid the need for more expensive face-to-face services. 4. Only one practitioner may furnish and bill the service in a calendar month. 5. The patient may stop PCM/RPM services at any time (effective at the end of the month) by phone call to the office staff.  Patient agreed to services and verbal consent obtained.   Cassell Clement, Pharm.D. Clinical Pharmacist [facility] [phone] Ext: [phone]

## 2020-06-09 NOTE — Telephone Encounter (Signed)
Patient wants to have her gabapentin sent as a 90 day supply to [facility] mail order. I called to verify that she did want to have this fill this at the mail order for her Gabapentin. Waiting on a call back.

## 2020-06-09 NOTE — Therapy (Signed)
[facility] [address] [place], [place], [address] Phone: [phone]   Fax:  [phone]  Occupational Therapy Treatment  Patient Details  Name: Kathryn Olsen MRN: [id number] Date of Birth: [birth year] Referring Provider (OT): Dr. Thane Edu   Encounter Date: 06/09/2020   OT End of Session - 06/09/20 1739    Visit Number 5    Number of Visits 6    Date for OT Re-Evaluation 06/11/20    Authorization Type [employer]    Authorization Time Period $10 copay, [employer] approved 6 visits.    Authorization - Visit Number 4    Authorization - Number of Visits 6    Progress Note Due on Visit 10    OT Start Time 1730    OT Stop Time 1810    OT Time Calculation (min) 40 min    Activity Tolerance Patient limited by pain;Patient tolerated treatment well    Behavior During Therapy [facility] for tasks assessed/performed           Past Medical History:  Diagnosis Date  . Asthma   . COPD (chronic obstructive pulmonary disease) (HCC)    questionable  . Degeneration of intervertebral disc, site unspecified   . Depression   . DJD (degenerative joint disease)   . Dyslipidemia   . GERD (gastroesophageal reflux disease)   . Pneumonia 2/15  . Shortness of breath    on exertion  . Sleep apnea    stopbang=4  . Unspecified essential hypertension     Past Surgical History:  Procedure Laterality Date  . APPENDECTOMY    . ECTOPIC PREGNANCY SURGERY    . KNEE ARTHROSCOPY  2010    Right knee  . KNEE ARTHROSCOPY  2012   Left knee  . 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 TOTAL KNEE REVISION Left 09/23/2013   Procedure: REVISION LEFT TOTAL KNEE WITH POLY EXCHANGE UPSIZE AND PATELLA REVISION;  Surgeon: Shelda Pal, MD;  Location: [facility];  Service: Orthopedics;  Laterality: Left;    There were no vitals filed  for this visit.   Subjective Assessment - 06/09/20 1736    Subjective  S: I'm trying to use it as much as I can.    Currently in Pain? Yes    Pain Score 3     Pain Location Hand    Pain Orientation Right    Pain Descriptors / Indicators Sore    Pain Type Acute pain    Pain Onset More than a month ago    Pain Frequency Constant    Aggravating Factors  increased use    Pain Relieving Factors over the counter pain medication    Effect of Pain on Daily Activities moderately    Multiple Pain Sites No              [facility] OT Assessment - 06/09/20 1742      Assessment   Medical Diagnosis s/p right distal radius fx      Precautions   Precautions None                    OT Treatments/Exercises (OP) - 06/09/20 1741      Exercises   Exercises Wrist;Hand;Theraputty      Wrist Exercises   Other wrist exercises A/ROM wrist flexion/extension 10X, radial/ulnar deviation 10X    Other wrist  exercises Holding yellow putty, patient completed resisted wrist flexion and extension      Additional Wrist Exercises   Hand Gripper with Large Beads 5 beads with gripper set at 7# and 1 bead with gripper set  at 11#    Hand Gripper with Medium Beads 5 beads with gripper set at 7#      Hand Exercises   Other Hand Exercises utilized pvc pipe to press circles into flattened yellow putty focusing on wrist flexion/extension, grip strength.      Theraputty   Theraputty - Flatten yellow - attempted although elbow popped and movement was terminated    Theraputty - Roll yellow    Theraputty - Grip yellow - Completed with forearm off table and hand in nuetral with no pain.    Theraputty - Pinch yellow - lateral and 3 point 1' each                    OT Short Term Goals - 05/28/20 0957      OT SHORT TERM GOAL #1   Title Pt will be provided with and educated on HEP to improve mobility of RUE required for ADL completion.    Time 3    Period Weeks    Status On-going    Target  Date 06/11/20      OT SHORT TERM GOAL #2   Title Pt will decrease pain in right hand to 4/10 or less to improve ability to sleep for 4+ hours without waking up due to pain.    Time 3    Period Weeks    Status On-going      OT SHORT TERM GOAL #3   Title Pt will increase right wrist A/ROM to [facility] to improve ability to reach back and perform toilet hygiene.    Time 3    Period Weeks    Status On-going      OT SHORT TERM GOAL #4   Title Pt will increase right wrist strength and stability to 4+/5 or greater to improve ability to stir thick batters when cooking and baking.    Time 3    Period Weeks    Status On-going      OT SHORT TERM GOAL #5   Title Pt will increase right grip strength to 25# and pinch strength to 8# or greater to improve ability to hold items when performing ADLs such as grooming, bathing.    Time 3    Period Weeks    Status On-going      OT SHORT TERM GOAL #6   Title Pt will be educated on splint wear and care of elbow extension splint.    Time 3    Period Weeks    Status On-going                    Plan - 06/09/20 1740    Clinical Impression Statement A: Pt experiences pain at times in the lateral aspect of right elbow with clicking and popping occassionally. This presents more when hand is pronated and forearm is resting on tabletop. Less pain and discomfort felt with elbow slightly bent and forearm off table with hand in neutral. Utilized putty and handgripper this date for hand strengthening. Pt was able was to complete handgripper without reports of pain. Reports that she is able to pick up an empty coffee mug now but is unable to pick it up with liquid inside. VC for form and technique were provided.  Discussed finding positions for her RUE that are the least painful with preference to no pain. Refrain from painful activities at this time. Did not bring her elbow splint to OT session for adjustment. She reports she has been placing a washcloth at area  that is rubbing.    Body Structure / Function / Physical Skills ADL;Endurance;UE functional use;Fascial restriction;Pain;ROM;Sensation;IADL;Strength;Edema    Plan P: Reassessment and discharge as patient is going to [place] for a month. Update HEP as needed.    Consulted and Agree with Plan of Care Patient           Patient will benefit from skilled therapeutic intervention in order to improve the following deficits and impairments:   Body Structure / Function / Physical Skills: ADL,Endurance,UE functional use,Fascial restriction,Pain,ROM,Sensation,IADL,Strength,Edema       Visit Diagnosis: Other symptoms and signs involving the musculoskeletal system  Stiffness of right wrist, not elsewhere classified  Pain in right wrist    Problem List Patient Active Problem List   Diagnosis Date Noted  . PSVT (paroxysmal supraventricular tachycardia) (HCC) 12/19/2019  . Sinus pause 12/19/2019  . Pulmonary hypertension, unspecified (HCC) 11/28/2019  . Irregular heart beat 10/14/2019  . Dizziness 10/14/2019  . Asthma, chronic 11/29/2013  . Other malaise and fatigue 11/29/2013  . Shortness of breath 10/28/2013  . Obesity, unspecified 10/28/2013  . S/P left knee revision 09/23/2013  . Exertional dyspnea 08/09/2013  . Preoperative cardiovascular examination 08/09/2013  . Pre-operative cardiovascular examination 08/09/2013  . Chest pain 08/09/2013  . Dyspnea 08/09/2013  . Essential hypertension 08/09/2013  . Nonspecific (abnormal) findings on radiological and other examination of gastrointestinal tract 04/26/2011  . Esophageal reflux 04/26/2011  . Chest pain, unspecified 04/26/2011  . Special screening for malignant neoplasms, colon 04/26/2011  . DYSLIPIDEMIA 09/03/2008  . Malignant hypertension 09/03/2008  . DEGENERATIVE DISC DISEASE 09/03/2008    Limmie Patricia, OTR/L,CBIS  [phone]  06/09/2020, 6:24 PM  [facility] [address] [place], [place], [address] Phone: [phone]   Fax:  [phone]  Name: Kathryn Olsen MRN: [id number] Date of Birth: 1950/09/26

## 2020-06-10 ENCOUNTER — Ambulatory Visit (HOSPITAL_COMMUNITY): Payer: Medicare HMO | Admitting: Occupational Therapy

## 2020-06-10 ENCOUNTER — Ambulatory Visit: Payer: Medicare HMO | Admitting: Orthopedic Surgery

## 2020-06-10 ENCOUNTER — Encounter (HOSPITAL_COMMUNITY): Payer: Self-pay | Admitting: Occupational Therapy

## 2020-06-10 ENCOUNTER — Other Ambulatory Visit: Payer: Self-pay

## 2020-06-10 DIAGNOSIS — I10 Essential (primary) hypertension: Secondary | ICD-10-CM

## 2020-06-10 DIAGNOSIS — M25631 Stiffness of right wrist, not elsewhere classified: Secondary | ICD-10-CM

## 2020-06-10 DIAGNOSIS — R29898 Other symptoms and signs involving the musculoskeletal system: Secondary | ICD-10-CM

## 2020-06-10 DIAGNOSIS — M25531 Pain in right wrist: Secondary | ICD-10-CM

## 2020-06-10 MED ORDER — LOSARTAN POTASSIUM-HCTZ 100-25 MG PO TABS: 1.0000 | ORAL_TABLET | Freq: Every day | ORAL | 2 refills | Status: DC

## 2020-06-10 NOTE — Therapy (Signed)
[place], [place], [address] Phone: [phone]   Fax:  [phone]  Occupational Therapy Reassessment, Treatment, Discharge Summary  Patient Details  Name: Kathryn Olsen MRN: [id number] Date of Birth: 03-30-1950 Referring Provider (OT): Dr. Larena Glassman   Encounter Date: 06/10/2020   OT End of Session - 06/10/20 0901    Visit Number 6    Number of Visits 6    Date for OT Re-Evaluation 06/11/20    Authorization Type [employer]    Authorization Time Period $10 copay, [employer] approved 6 visits.    Authorization - Visit Number 5    Authorization - Number of Visits 6    Progress Note Due on Visit 10    OT Start Time [phone]   pt arrived late   OT Stop Time 0856    OT Time Calculation (min) 30 min    Activity Tolerance Patient limited by pain;Patient tolerated treatment well    Behavior During Therapy [facility] for tasks assessed/performed           Past Medical History:  Diagnosis Date  . Asthma   . COPD (chronic obstructive pulmonary disease) ([place])    questionable  . Degeneration of intervertebral disc, site unspecified   . Depression   . DJD (degenerative joint disease)   . Dyslipidemia   . GERD (gastroesophageal reflux disease)   . Pneumonia 2/15  . Shortness of breath    on exertion  . Sleep apnea    stopbang=4  . Unspecified essential hypertension     Past Surgical History:  Procedure Laterality Date  . APPENDECTOMY    . ECTOPIC PREGNANCY SURGERY    . KNEE ARTHROSCOPY  2010    Right knee  . KNEE ARTHROSCOPY  2012   Left knee  . TOTAL KNEE ARTHROPLASTY  11/22/2011   Procedure: TOTAL KNEE ARTHROPLASTY;  Surgeon: Magnus Sinning, MD;  Location: [facility];  Service: Orthopedics;  Laterality: Left;  . TOTAL KNEE REVISION Left 09/23/2013   Procedure: REVISION LEFT TOTAL KNEE WITH POLY EXCHANGE UPSIZE AND PATELLA REVISION;  Surgeon: Mauri Pole, MD;  Location: [facility];  Service: Orthopedics;   Laterality: Left;    There were no vitals filed for this visit.   Subjective Assessment - 06/10/20 0828    Subjective  S: I'm closing my fingers more than I was before.    Currently in Pain? Yes    Pain Score 2     Pain Location Hand    Pain Orientation Right    Pain Descriptors / Indicators Sore    Pain Type Acute pain    Pain Radiating Towards elbow    Pain Onset More than a month ago    Pain Frequency Intermittent    Aggravating Factors  increased use    Pain Relieving Factors over the counter pain medication    Effect of Pain on Daily Activities moderately    Multiple Pain Sites No              [facility] OT Assessment - 06/10/20 0828      Assessment   Medical Diagnosis s/p right distal radius fx      Precautions   Precautions None      Observation/Other Assessments   Quick DASH  52.27   70.45 previous     AROM   Right Elbow Extension 18   22 previous (20 LUE baseline)   Right Forearm Supination 58 Degrees   42 previous  Right Wrist Extension 30 Degrees   10 previous   Right Wrist Flexion 35 Degrees   30 previous   Right Wrist Radial Deviation 10 Degrees   same as previous   Right Wrist Ulnar Deviation 35 Degrees   25 previous     PROM   Right Forearm Supination 70 Degrees   42 previous   Right Wrist Extension 52 Degrees   48 previous   Right Wrist Flexion 45 Degrees   35 previous   Right Wrist Radial Deviation 12 Degrees   same as previous   Right Wrist Ulnar Deviation 35 Degrees   30 previous     Strength   Right Forearm Supination 4/5   3+/5 previous   Right Wrist Flexion 4/5   4-/5 previous   Right Wrist Extension 4+/5   same as previous   Right Wrist Radial Deviation 5/5   4/5 previous   Right Wrist Ulnar Deviation 4/5   same as previous   Right Hand Gross Grasp Functional    Right Hand Grip (lbs) 18   10 previous   Right Hand Lateral Pinch 7 lbs   6 previous   Right Hand 3 Point Pinch 8 lbs   5 previous             Katina Dung - 06/10/20 0840     Open a tight or new jar Moderate difficulty    Do heavy household chores (wash walls, wash floors) Moderate difficulty    Carry a shopping bag or briefcase Mild difficulty    Wash your back Unable    Use a knife to cut food Severe difficulty    Recreational activities in which you take some force or impact through your arm, shoulder, or hand (golf, hammering, tennis) Unable    During the past week, to what extent has your arm, shoulder or hand problem interfered with your normal social activities with family, friends, neighbors, or groups? Not at all    During the past week, to what extent has your arm, shoulder or hand problem limited your work or other regular daily activities Modererately    Arm, shoulder, or hand pain. Moderate    Tingling (pins and needles) in your arm, shoulder, or hand Moderate    Difficulty Sleeping Mild difficulty    DASH Score 52.27 %                OT Treatments/Exercises (OP) - 06/10/20 9476      ADLs   ADL Comments Discussed ADL completion and strategies to support and stabilize wrist. Provided dycem to assist with opening jars and bottles.      Exercises   Exercises Wrist;Hand;Theraputty      Additional Wrist Exercises   Hand Gripper with Large Beads all beads gripper at 11#    Hand Gripper with Medium Beads all beads gripper at 7#      Splinting   Splinting adjusted elbow splint to eliminate pressure point                  OT Education - 06/10/20 0849    Education Details provided dycem, yellow theraputty grip and pinch strengthening    Person(s) Educated Patient    Methods Explanation;Demonstration;Handout    Comprehension Verbalized understanding;Returned demonstration            OT Short Term Goals - 06/10/20 0901      OT SHORT TERM GOAL #1   Title Pt will be provided with and educated  on HEP to improve mobility of RUE required for ADL completion.    Time 3    Period Weeks    Status Achieved    Target Date 06/11/20       OT SHORT TERM GOAL #2   Title Pt will decrease pain in right hand to 4/10 or less to improve ability to sleep for 4+ hours without waking up due to pain.    Time 3    Period Weeks    Status Achieved      OT SHORT TERM GOAL #3   Title Pt will increase right wrist A/ROM to [facility] to improve ability to reach back and perform toilet hygiene.    Time 3    Period Weeks    Status Partially Met      OT SHORT TERM GOAL #4   Title Pt will increase right wrist strength and stability to 4+/5 or greater to improve ability to stir thick batters when cooking and baking.    Time 3    Period Weeks    Status Not Met      OT SHORT TERM GOAL #5   Title Pt will increase right grip strength to 25# and pinch strength to 8# or greater to improve ability to hold items when performing ADLs such as grooming, bathing.    Time 3    Period Weeks    Status Partially Met      OT SHORT TERM GOAL #6   Title Pt will be educated on splint wear and care of elbow extension splint.    Time 3    Period Weeks    Status Achieved                    Plan - 06/10/20 0902    Clinical Impression Statement A: Reassessment completed this session, pt has met 3/6 goals and partially met 2 additional goals. Pt demonstrates progress with ROM, pain, and strength. Pt reports she is able to use her right hand and wrist with more ADLs and is only wearing her wrist splint for heavy work such as vacuuming and sweeping, sometimes for stirring thick batters. Pt brought elbow splint and OT adjusted one corner causing pressure. Pt is pleased with her progress, verbalizes understanding of HEP. Pt is agreeable to discharge today as she is traveling to [place] for a month.    Body Structure / Function / Physical Skills ADL;Endurance;UE functional use;Fascial restriction;Pain;ROM;Sensation;IADL;Strength;Edema    Plan P: Discharge pt    OT Home Exercise Plan eval: wrist and hand A/ROM; 3/17: splint wear and care 3/23: prayer stretch;  3/30: yellow theraputty grip and pinch strengthening, dycem provided    Consulted and Agree with Plan of Care Patient           Patient will benefit from skilled therapeutic intervention in order to improve the following deficits and impairments:   Body Structure / Function / Physical Skills: ADL,Endurance,UE functional use,Fascial restriction,Pain,ROM,Sensation,IADL,Strength,Edema       Visit Diagnosis: Other symptoms and signs involving the musculoskeletal system  Stiffness of right wrist, not elsewhere classified  Pain in right wrist    Problem List Patient Active Problem List   Diagnosis Date Noted  . PSVT (paroxysmal supraventricular tachycardia) ([place]) 12/19/2019  . Sinus pause 12/19/2019  . Pulmonary hypertension, unspecified ([place]) 11/28/2019  . Irregular heart beat 10/14/2019  . Dizziness 10/14/2019  . Asthma, chronic 11/29/2013  . Other malaise and fatigue 11/29/2013  . Shortness of breath 10/28/2013  .  Obesity, unspecified 10/28/2013  . S/P left knee revision 09/23/2013  . Exertional dyspnea 08/09/2013  . Preoperative cardiovascular examination 08/09/2013  . Pre-operative cardiovascular examination 08/09/2013  . Chest pain 08/09/2013  . Dyspnea 08/09/2013  . Essential hypertension 08/09/2013  . Nonspecific (abnormal) findings on radiological and other examination of gastrointestinal tract 04/26/2011  . Esophageal reflux 04/26/2011  . Chest pain, unspecified 04/26/2011  . Special screening for malignant neoplasms, colon 04/26/2011  . DYSLIPIDEMIA 09/03/2008  . Malignant hypertension 09/03/2008  . DEGENERATIVE DISC DISEASE 09/03/2008   Guadelupe Sabin, OTR/L  [phone] 06/10/2020, 9:06 AM  [place], [place], [address] Phone: [phone]   Fax:  [phone]  Name: Kathryn Olsen MRN: [id number] Date of Birth: 06/24/1950   OCCUPATIONAL THERAPY DISCHARGE SUMMARY  Visits from  Start of Care: 6  Current functional level related to goals / functional outcomes: See above. Pt has met 3/6 goals, partially met 2 additional goals. Pt is using her RUE for more ADLs, decreased tingling at night with use of elbow extension splint. Pt is traveling to [place] for 1 month and is being discharged.   Remaining deficits: Continues to have pain in elbow and wrist, decreased ROM and strength   Education / Equipment: HEP, dycem provided  Plan: Patient agrees to discharge.  Patient goals were partially met. Patient is being discharged due to the patient's request.  ?????

## 2020-06-10 NOTE — Patient Instructions (Signed)

## 2020-06-12 MED ORDER — GABAPENTIN 100 MG PO CAPS: 100.0000 mg | ORAL_CAPSULE | Freq: Three times a day (TID) | ORAL | 1 refills | Status: DC

## 2020-06-12 NOTE — Telephone Encounter (Signed)
I called the patient and she did want to keep taking the Gabapentin and I talked with Dr. Dallas Schimke and he gave a verbal ok to send in the Gabapentin.

## 2020-08-05 ENCOUNTER — Other Ambulatory Visit (HOSPITAL_COMMUNITY): Payer: Self-pay | Admitting: *Deleted

## 2020-08-05 DIAGNOSIS — D751 Secondary polycythemia: Secondary | ICD-10-CM

## 2020-08-06 ENCOUNTER — Inpatient Hospital Stay (HOSPITAL_COMMUNITY): Payer: Medicare HMO | Attending: Hematology

## 2020-08-13 ENCOUNTER — Ambulatory Visit (HOSPITAL_COMMUNITY): Payer: Medicare HMO | Admitting: Hematology

## 2020-08-20 ENCOUNTER — Other Ambulatory Visit (HOSPITAL_COMMUNITY): Payer: Medicare HMO

## 2020-11-10 ENCOUNTER — Other Ambulatory Visit (HOSPITAL_COMMUNITY): Payer: Self-pay | Admitting: Nurse Practitioner

## 2020-11-10 DIAGNOSIS — Z1231 Encounter for screening mammogram for malignant neoplasm of breast: Secondary | ICD-10-CM

## 2020-11-18 ENCOUNTER — Ambulatory Visit: Payer: Medicare HMO | Admitting: Pulmonary Disease

## 2020-11-18 ENCOUNTER — Other Ambulatory Visit (HOSPITAL_COMMUNITY): Payer: Self-pay | Admitting: Nurse Practitioner

## 2020-11-18 ENCOUNTER — Other Ambulatory Visit: Payer: Self-pay

## 2020-11-18 ENCOUNTER — Encounter: Payer: Self-pay | Admitting: Pulmonary Disease

## 2020-11-18 VITALS — BP 132/74 | HR 88 | Temp 98.2°F | Ht 62.0 in | Wt 245.0 lb

## 2020-11-18 DIAGNOSIS — I272 Pulmonary hypertension, unspecified: Secondary | ICD-10-CM

## 2020-11-18 DIAGNOSIS — R0683 Snoring: Secondary | ICD-10-CM | POA: Insufficient documentation

## 2020-11-18 DIAGNOSIS — J45901 Unspecified asthma with (acute) exacerbation: Secondary | ICD-10-CM | POA: Diagnosis not present

## 2020-11-18 DIAGNOSIS — R0600 Dyspnea, unspecified: Secondary | ICD-10-CM | POA: Diagnosis not present

## 2020-11-18 DIAGNOSIS — R0609 Other forms of dyspnea: Secondary | ICD-10-CM

## 2020-11-18 MED ORDER — FLUTICASONE FUROATE-VILANTEROL 100-25 MCG/INH IN AEPB: 1.0000 | INHALATION_SPRAY | Freq: Every day | RESPIRATORY_TRACT | 0 refills | Status: DC

## 2020-11-18 NOTE — Assessment & Plan Note (Signed)
I have asked her to increase Symbicort twice daily. Alternatively we can trial Breo, samples given today since this is once daily -she will call us back for prescription of this is worked that is covered by her insurance.  In the future, if Convinced this is true asthma, we can proceed with RAST, FENO testing  Meanwhile we will repeat spirometry pre and post

## 2020-11-18 NOTE — Patient Instructions (Signed)
Increase lasix to 40 mg daily x 5 days ( 5lb wt loss ) then back down to 20 mg Increase symbicort twice daily TRIAL of BREO 100 once daily - call me for Rx if this works   Schedule echo Spirometry - pre/post Home sleep test

## 2020-11-18 NOTE — Assessment & Plan Note (Signed)
The other cause could be 10 pound weight gain which indicates some degree of fluid retention. Of acid increase Lasix to 40 mg daily for 5 days and then revert back to 20 mg which is her usual dose. Also repeat echo for right heart function, moderate TR was noted on the prior echo

## 2020-11-18 NOTE — Assessment & Plan Note (Signed)
Given excessive daytime somnolence, narrow pharyngeal exam & loud snoring, obstructive sleep apnea is very likely & an overnight polysomnogram will be scheduled as a home study. The pathophysiology of obstructive sleep apnea , it's cardiovascular consequences & modes of treatment including CPAP were discused with the patient in detail & they evidenced understanding.  

## 2020-11-18 NOTE — Progress Notes (Signed)
Subjective:    Patient ID: Kathryn Olsen, female    DOB: 1951-01-12, 70 y.o.   MRN: [id number]  HPI  70 yo  retired Engineer, civil (consulting)  never smoker with chronic asthma presents to establish care. Was followed by [facility], last seen 10/2015 She has never received much benefit from bronchodilators and it was felt that significant portion of her dyspnea is related to obesity and deconditioning   PMH -hypertension, hiatal hernia, bulging disc L4/5  Asthma was diagnosed about 10 years ago She lives in [place] for 3 months during spring and fall.  She reports increased dyspnea on exertion, intermittent wheezing.  She admits to taking Symbicort once daily.  This is very expensive.  She reports only rare use of MDI and in fact this expires before she finishes all doses.  She rarely needs nebulizer. She is able to self administer prednisone Dosepak and keeps a prescription on hand.  She reports a chronic cough and intermittent wheezing.  She reports an episode of pneumonia last winter. ACT score is 16  She was scheduled to have hiatal hernia repair but unfortunately developed COVID infection and this had to be deferred  Mild snoring has been noted by her husband was not witnessed apneas, no bed partner sleep history is available.  She has been noted to be polycythemic, last hemoglobin 15.18 April 2020 she reports mild sleepiness in the daytime  She reports a 10 pound weight gain within the past month  CXR 01/2020 large hiatal hernia  Significant tests/ events reviewed  11/2013 PFTs >> ratio 63 with FEV1 of 1.39 L (55% predicted) pre-bronchodilator, postbronchodilator ratio 73 FEV1 2.03 L (80% predicted, 46% change) DLCO of 99% predicted  Echo 10/2019 Concentric LVH, moderate TR, RVSP 38  04/2020 Eos 300    Past Medical History:  Diagnosis Date   Asthma    COPD (chronic obstructive pulmonary disease) (HCC)    questionable   Degeneration of intervertebral disc, site unspecified    Depression     DJD (degenerative joint disease)    Dyslipidemia    GERD (gastroesophageal reflux disease)    Pneumonia 2/15   Shortness of breath    on exertion   Sleep apnea    stopbang=4   Unspecified essential hypertension     Past Surgical History:  Procedure Laterality Date   APPENDECTOMY     ECTOPIC PREGNANCY SURGERY     KNEE ARTHROSCOPY  2010    Right knee   KNEE ARTHROSCOPY  2012   Left knee   TOTAL KNEE ARTHROPLASTY  11/22/2011   Procedure: TOTAL KNEE ARTHROPLASTY;  Surgeon: Drucilla Schmidt, MD;  Location: [facility];  Service: Orthopedics;  Laterality: Left;   TOTAL KNEE REVISION Left 09/23/2013   Procedure: REVISION LEFT TOTAL KNEE WITH POLY EXCHANGE UPSIZE AND PATELLA REVISION;  Surgeon: Shelda Pal, MD;  Location: [facility];  Service: Orthopedics;  Laterality: Left;    Allergies  Allergen Reactions   Buprenorphine Hcl Anaphylaxis and Itching   Ace Inhibitors Diarrhea    Social History   Socioeconomic History   Marital status: Married    Spouse name: Not on file   Number of children: 2   Years of education: Not on file   Highest education level: Not on file  Occupational History   Occupation: Nurse    Employer: [employer]  Tobacco Use   Smoking status: Passive Smoke Exposure - Never Smoker   Smokeless tobacco: Never   Tobacco comments:    both parents  smoked  Vaping Use   Vaping Use: Never used  Substance and Sexual Activity   Alcohol use: Yes    Alcohol/week: 1.0 standard drink    Types: 1 Glasses of wine per week    Comment: occasional glass of wine   Drug use: No   Sexual activity: Not Currently  Other Topics Concern   Not on file  Social History Narrative   Not on file   Social Determinants of Health   Financial Resource Strain: Not on file  Food Insecurity: Not on file  Transportation Needs: Not on file  Physical Activity: Not on file  Stress: Not on file  Social Connections: Not on file  Intimate Partner Violence: Not on file     Family  History  Problem Relation Age of Onset   Lung cancer Father    COPD Mother    Hypertension Mother    Melanoma Brother    Alcohol abuse Brother    Other Sister        MGUS     Review of Systems Shortness of breath with activity Nonproductive cough Irregular heartbeat Indigestion Tooth problems Feet swelling  Constitutional: negative for anorexia, fevers and sweats  Eyes: negative for irritation, redness and visual disturbance  Ears, nose, mouth, throat, and face: negative for earaches, epistaxis, nasal congestion and sore throat  Cardiovascular: negative for chest pain, orthopnea, palpitations and syncope  Gastrointestinal: negative for abdominal pain, constipation, diarrhea, melena, nausea and vomiting  Genitourinary:negative for dysuria, frequency and hematuria  Hematologic/lymphatic: negative for bleeding, easy bruising and lymphadenopathy  Musculoskeletal:negative for arthralgias, muscle weakness and stiff joints  Neurological: negative for coordination problems, gait problems, headaches and weakness  Endocrine: negative for diabetic symptoms including polydipsia, polyuria and weight loss     Objective:   Physical Exam  Gen. Pleasant, obese, in no distress, normal affect ENT - no pallor,icterus, no post nasal drip, class 2-3 airway Neck: No JVD, no thyromegaly, no carotid bruits Lungs: no use of accessory muscles, no dullness to percussion, decreased without rales or rhonchi  Cardiovascular: Rhythm regular, heart sounds  normal, no murmurs or gallops, no peripheral edema Abdomen: soft and non-tender, no hepatosplenomegaly, BS normal. Musculoskeletal: No deformities, no cyanosis or clubbing Neuro:  alert, non focal, no tremors        Assessment & Plan:

## 2020-11-30 ENCOUNTER — Other Ambulatory Visit (HOSPITAL_COMMUNITY): Payer: Self-pay | Admitting: Nurse Practitioner

## 2020-11-30 DIAGNOSIS — R921 Mammographic calcification found on diagnostic imaging of breast: Secondary | ICD-10-CM

## 2020-12-01 ENCOUNTER — Other Ambulatory Visit: Payer: Self-pay

## 2020-12-01 ENCOUNTER — Ambulatory Visit (HOSPITAL_COMMUNITY)
Admission: RE | Admit: 2020-12-01 | Discharge: 2020-12-01 | Disposition: A | Discharge status: Home / Self Care | Payer: Medicare HMO | Source: Ambulatory Visit | Attending: Nurse Practitioner | Admitting: Nurse Practitioner

## 2020-12-01 ENCOUNTER — Encounter (HOSPITAL_COMMUNITY): Payer: Self-pay

## 2020-12-01 DIAGNOSIS — R921 Mammographic calcification found on diagnostic imaging of breast: Secondary | ICD-10-CM

## 2020-12-04 ENCOUNTER — Ambulatory Visit (HOSPITAL_COMMUNITY)
Admission: RE | Admit: 2020-12-04 | Discharge: 2020-12-04 | Disposition: A | Discharge status: Home / Self Care | Payer: Medicare HMO | Source: Ambulatory Visit | Attending: Pulmonary Disease | Admitting: Pulmonary Disease

## 2020-12-04 ENCOUNTER — Other Ambulatory Visit: Payer: Self-pay

## 2020-12-04 DIAGNOSIS — I272 Pulmonary hypertension, unspecified: Secondary | ICD-10-CM | POA: Diagnosis not present

## 2020-12-04 LAB — ECHOCARDIOGRAM COMPLETE
AR max vel: 2.41 cm2
AV Area VTI: 2.67 cm2
AV Area mean vel: 2.17 cm2
AV Mean grad: 4 mmHg
AV Peak grad: 7.2 mmHg
Ao pk vel: 1.34 m/s
Area-P 1/2: 3.93 cm2
MV VTI: 2.75 cm2
S' Lateral: 2.3 cm

## 2020-12-07 NOTE — Progress Notes (Signed)
LMOM for patient to call office back. Call back information provided.

## 2020-12-17 NOTE — Progress Notes (Signed)
Spoke with pt and notified of results per Dr. Alva. Pt verbalized understanding and denied any questions. 

## 2020-12-17 NOTE — Progress Notes (Signed)
LMTCB for pt 

## 2020-12-26 IMAGING — RF DG BE W/ AIR HIGH DENSITY
1 series · 14 of 24 positions shown · non-contrast
Comparison: None.

CLINICAL DATA: Lower GI bleed.

EXAM:
AIR CONTRAST BARIUM ENEMA
TECHNIQUE: Initial scout AP supine abdominal image obtained to insure adequate
colon cleansing. Barium was introduced into the colon in a
retrograde fashion and refluxed from the rectum to the distal
transverse colon. As much of the barium as possible was then removed
through the indwelling tube via gravity drain. Air was then
insufflated into the colon. Spot images of the colon followed by
overhead radiographs were obtained.
FLUOROSCOPY TIME:  Fluoroscopy Time:  6 minutes and 12 seconds.
Radiation Exposure Index (if provided by the fluoroscopic device):
4,444 mGy
Number of Acquired Spot Images:

[Series 1: one shot · 0.14mm/px · 14 of 53 slices shown]
[im 1/53]
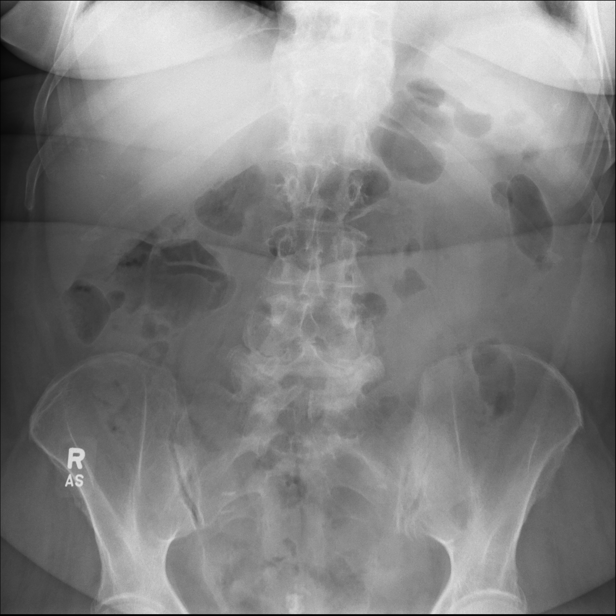
[im 5/53]
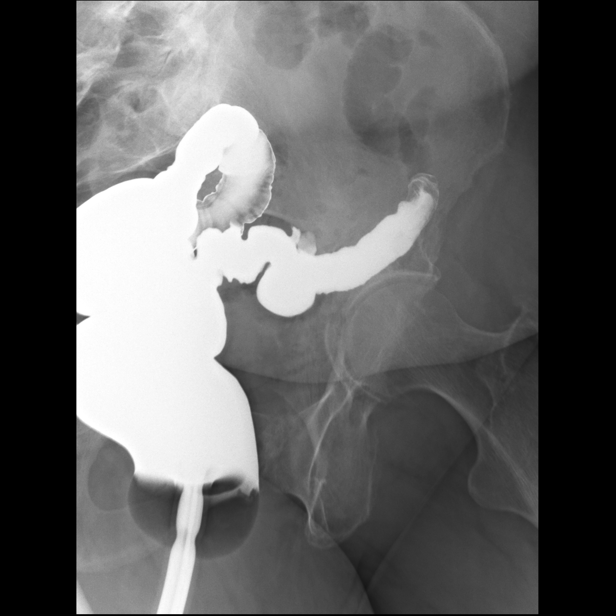
[im 10/53]
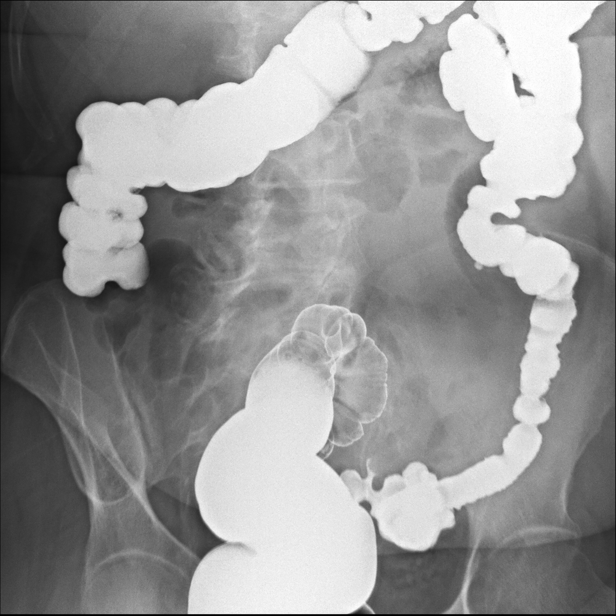
[im 14/53]
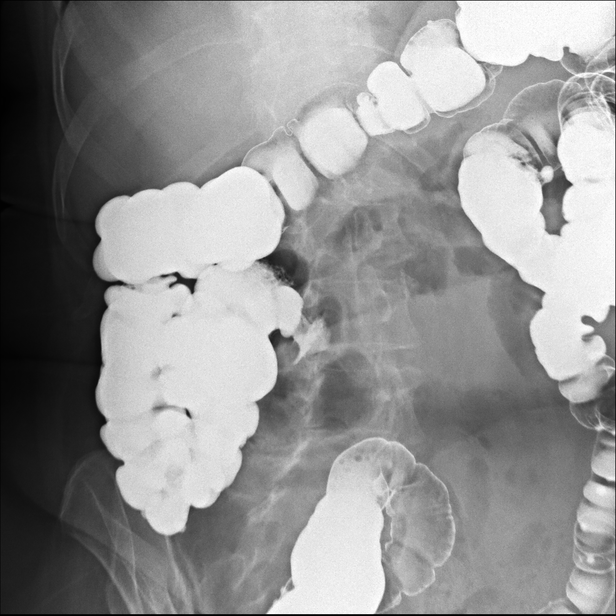
[im 16/53]
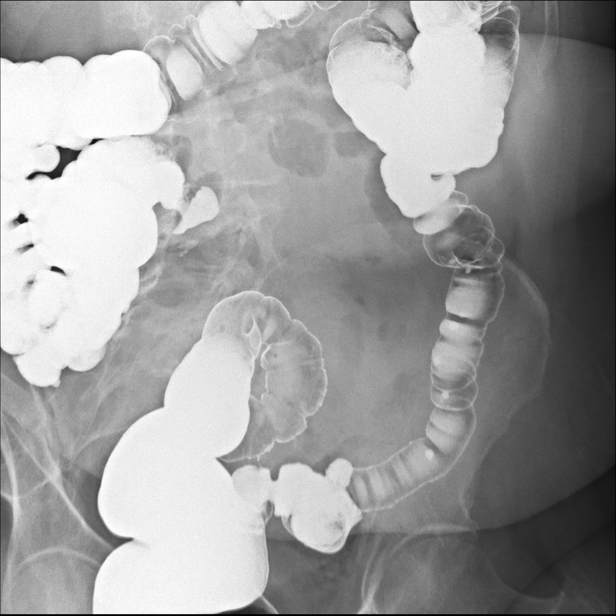
[im 21/53]
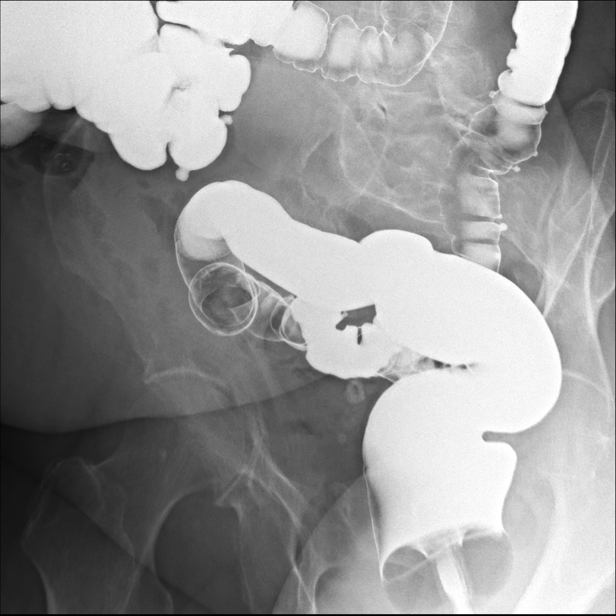
[im 25/53]
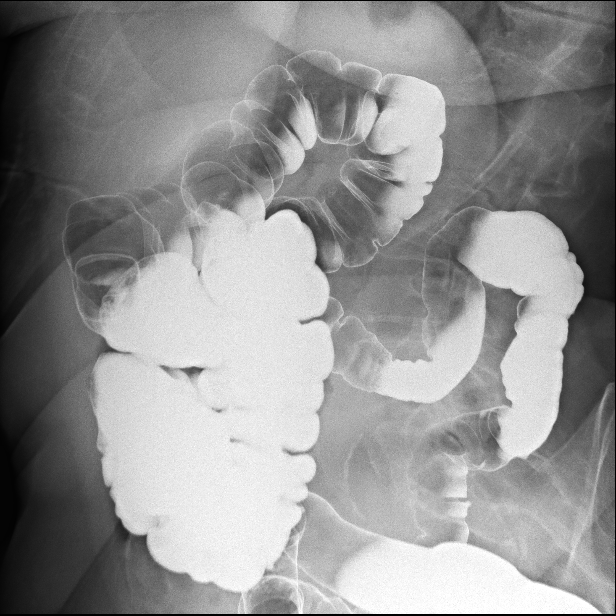
[im 28/53]
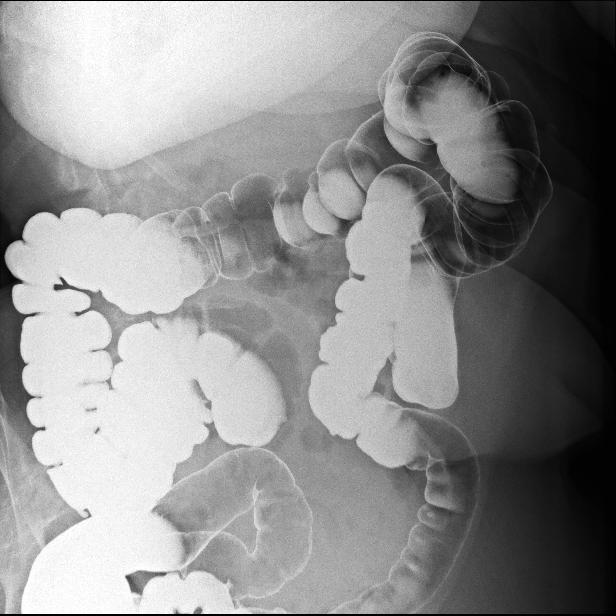
[im 32/53]
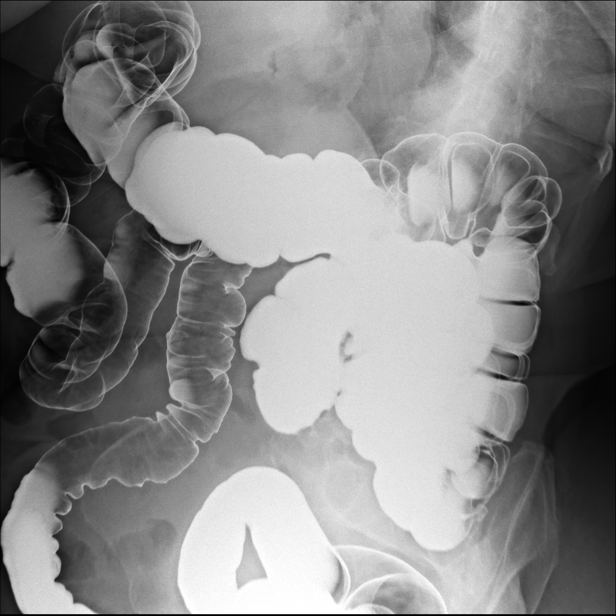
[im 37/53]
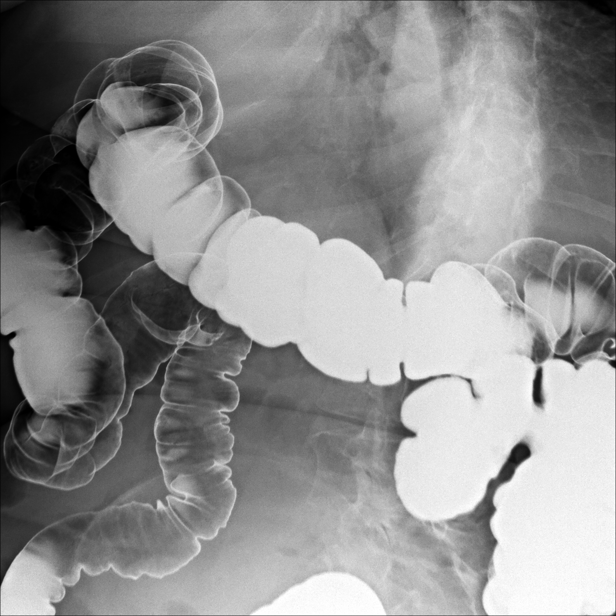
[im 41/53]
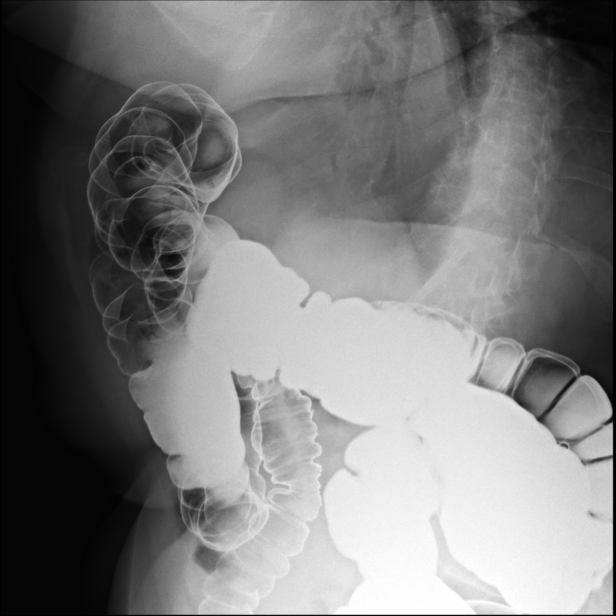
[im 43/53]
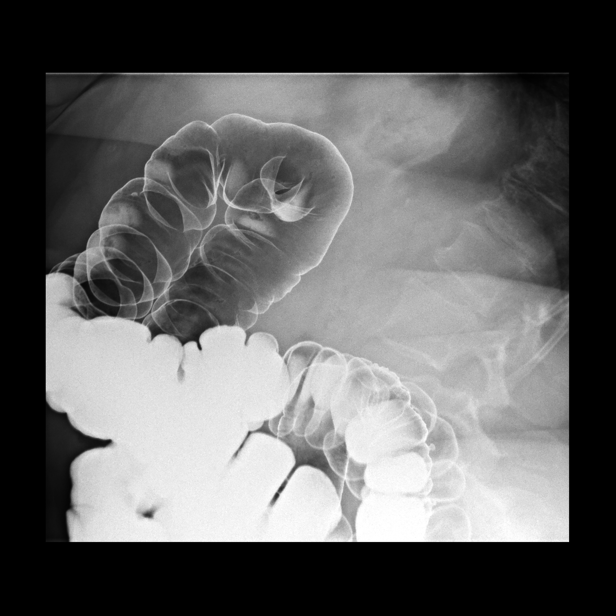
[im 48/53]
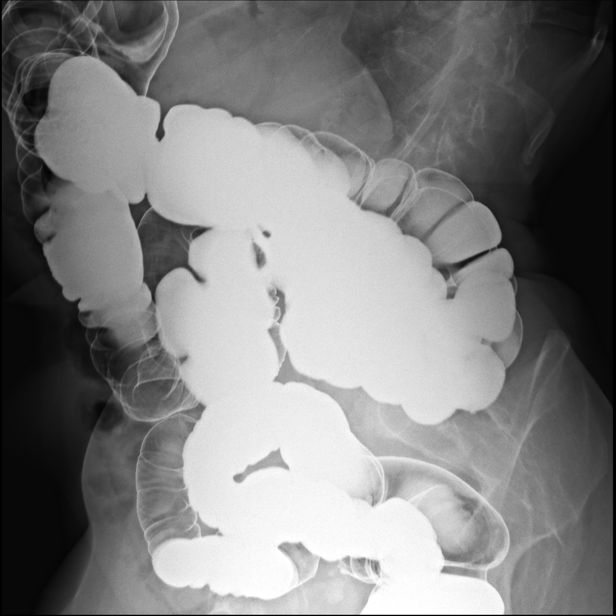
[im 53/53]
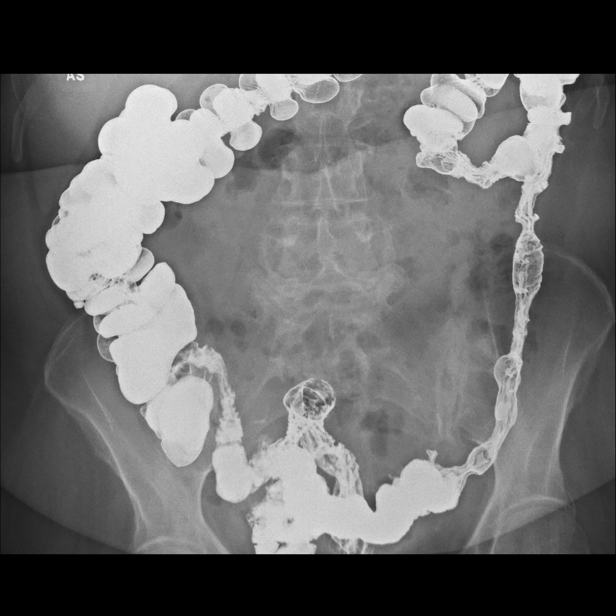

[14 of 24 positions shown; findings below may reference images not displayed]

FINDINGS: Pre-procedure scout KUB shows a normal bowel gas pattern.

Double contrast imaging of the colon shows some mild diverticular
changes in the sigmoid segment. Sigmoid and right colonic segments
are markedly tortuous with superimposition on multiple projections.
Patient was placed in numerous different positions with image
intensifier angled differentially for visualization. No clinically
significant colonic polyp or mass is identified. No evidence for
colonic stricture.
IMPRESSION: 1. No clinically significant colonic polyp or mass.
2. Mild left-side predominant diverticular disease.

## 2021-02-12 ENCOUNTER — Ambulatory Visit: Payer: Medicare HMO | Admitting: Obstetrics & Gynecology

## 2021-02-12 ENCOUNTER — Encounter: Payer: Self-pay | Admitting: Obstetrics & Gynecology

## 2021-02-12 ENCOUNTER — Other Ambulatory Visit: Payer: Self-pay

## 2021-02-12 VITALS — BP 114/80 | HR 87 | Ht 65.0 in | Wt 242.0 lb

## 2021-02-12 DIAGNOSIS — N950 Postmenopausal bleeding: Secondary | ICD-10-CM

## 2021-02-12 NOTE — Progress Notes (Signed)
Chief Complaint  Patient presents with   Vaginal Bleeding    Spotting for a couple of days about 3 weeks ago, none since      70 y.o. G2P0 No LMP recorded. Patient is postmenopausal. The current method of family planning is post menopausal status.  Outpatient Encounter Medications as of 02/12/2021  Medication Sig Note   albuterol (PROVENTIL HFA;VENTOLIN HFA) 108 (90 Base) MCG/ACT inhaler Inhale 1-2 puffs into the lungs every 6 (six) hours as needed for wheezing or shortness of breath.    amLODipine (NORVASC) 5 MG tablet Take 5 mg by mouth every evening.    aspirin EC 81 MG tablet Take 81 mg by mouth daily. Swallow whole.    atorvastatin (LIPITOR) 10 MG tablet Take 10 mg by mouth daily.    budesonide-formoterol (SYMBICORT) 80-4.5 MCG/ACT inhaler Inhale 2 puffs into the lungs 2 (two) times daily.    Calcium Carb-Cholecalciferol (CALCIUM 500/D PO) Take by mouth.    diltiazem (CARDIZEM) 30 MG tablet TAKE 1 TABLET(30 MG) BY MOUTH EVERY 8 HOURS AS NEEDED (Patient taking differently: Take 30 mg by mouth 3 (three) times daily as needed. Blood pressure)    furosemide (LASIX) 20 MG tablet Take 20 mg by mouth daily.     ibuprofen (ADVIL) 800 MG tablet Take 1 tablet (800 mg total) by mouth 3 (three) times daily.    losartan-hydrochlorothiazide (HYZAAR) 100-25 MG tablet Take 1 tablet by mouth daily.    meloxicam (MOBIC) 15 MG tablet Take 15 mg by mouth at bedtime.     Multiple Vitamin (MULTIVITAMIN WITH MINERALS) TABS tablet Take 1 tablet by mouth daily.    omeprazole (PRILOSEC) 20 MG capsule Take 20 mg by mouth daily. 03/09/2020: On hold until patient purchases more   Potassium Gluconate 550 MG TABS Take 550 mg by mouth daily.    [DISCONTINUED] fluticasone furoate-vilanterol (BREO ELLIPTA) 100-25 MCG/INH AEPB Inhale 1 puff into the lungs daily. (Patient not taking: Reported on 02/12/2021)    No facility-administered encounter medications on file as of 02/12/2021.    Subjective Pt had a  couple days of spotting about 1 month ago, enough to see when she wiped, not otherwise in her panties or in the toilet just when wiped  No pain On ASA No other anti coagulants at present  Not diabetic  Past Medical History:  Diagnosis Date   Asthma    COPD (chronic obstructive pulmonary disease) (HCC)    questionable   Degeneration of intervertebral disc, site unspecified    Depression    DJD (degenerative joint disease)    Dyslipidemia    GERD (gastroesophageal reflux disease)    Pneumonia 2/15   Shortness of breath    on exertion   Sleep apnea    stopbang=4   Unspecified essential hypertension     Past Surgical History:  Procedure Laterality Date   APPENDECTOMY     ECTOPIC PREGNANCY SURGERY     KNEE ARTHROSCOPY  2010    Right knee   KNEE ARTHROSCOPY  2012   Left knee   TOTAL KNEE ARTHROPLASTY  11/22/2011   Procedure: TOTAL KNEE ARTHROPLASTY;  Surgeon: Drucilla Schmidt, MD;  Location: [facility];  Service: Orthopedics;  Laterality: Left;   TOTAL KNEE REVISION Left 09/23/2013   Procedure: REVISION LEFT TOTAL KNEE WITH POLY EXCHANGE UPSIZE AND PATELLA REVISION;  Surgeon: Shelda Pal, MD;  Location: [facility];  Service: Orthopedics;  Laterality: Left;    OB History  Gravida  2   Para      Term      Preterm      AB      Living         SAB      IAB      Ectopic      Multiple      Live Births              Allergies  Allergen Reactions   Buprenorphine Hcl Anaphylaxis and Itching   Ace Inhibitors Diarrhea    Social History   Socioeconomic History   Marital status: Married    Spouse name: Not on file   Number of children: 2   Years of education: Not on file   Highest education level: Not on file  Occupational History   Occupation: Nurse    Employer: [employer]  Tobacco Use   Smoking status: Never    Passive exposure: Yes   Smokeless tobacco: Never   Tobacco comments:    both parents smoked  Vaping Use   Vaping Use: Never used   Substance and Sexual Activity   Alcohol use: Yes    Alcohol/week: 1.0 standard drink    Types: 1 Glasses of wine per week    Comment: occasional glass of wine   Drug use: No   Sexual activity: Not Currently    Birth control/protection: Post-menopausal  Other Topics Concern   Not on file  Social History Narrative   Not on file   Social Determinants of Health   Financial Resource Strain: Not on file  Food Insecurity: Not on file  Transportation Needs: Not on file  Physical Activity: Not on file  Stress: Not on file  Social Connections: Not on file    Family History  Problem Relation Age of Onset   Lung cancer Father    COPD Mother    Hypertension Mother    Melanoma Brother    Alcohol abuse Brother    Other Sister        MGUS    Medications:       Current Outpatient Medications:    albuterol (PROVENTIL HFA;VENTOLIN HFA) 108 (90 Base) MCG/ACT inhaler, Inhale 1-2 puffs into the lungs every 6 (six) hours as needed for wheezing or shortness of breath., Disp: 1 Inhaler, Rfl: 6   amLODipine (NORVASC) 5 MG tablet, Take 5 mg by mouth every evening., Disp: , Rfl:    aspirin EC 81 MG tablet, Take 81 mg by mouth daily. Swallow whole., Disp: , Rfl:    atorvastatin (LIPITOR) 10 MG tablet, Take 10 mg by mouth daily., Disp: , Rfl:    budesonide-formoterol (SYMBICORT) 80-4.5 MCG/ACT inhaler, Inhale 2 puffs into the lungs 2 (two) times daily., Disp: 2 Inhaler, Rfl: 0   Calcium Carb-Cholecalciferol (CALCIUM 500/D PO), Take by mouth., Disp: , Rfl:    diltiazem (CARDIZEM) 30 MG tablet, TAKE 1 TABLET(30 MG) BY MOUTH EVERY 8 HOURS AS NEEDED (Patient taking differently: Take 30 mg by mouth 3 (three) times daily as needed. Blood pressure), Disp: 270 tablet, Rfl: 1   furosemide (LASIX) 20 MG tablet, Take 20 mg by mouth daily. , Disp: , Rfl:    ibuprofen (ADVIL) 800 MG tablet, Take 1 tablet (800 mg total) by mouth 3 (three) times daily., Disp: 30 tablet, Rfl: 0   losartan-hydrochlorothiazide  (HYZAAR) 100-25 MG tablet, Take 1 tablet by mouth daily., Disp: 90 tablet, Rfl: 2   meloxicam (MOBIC) 15 MG tablet, Take 15 mg  by mouth at bedtime. , Disp: , Rfl:    Multiple Vitamin (MULTIVITAMIN WITH MINERALS) TABS tablet, Take 1 tablet by mouth daily., Disp: , Rfl:    omeprazole (PRILOSEC) 20 MG capsule, Take 20 mg by mouth daily., Disp: , Rfl:    Potassium Gluconate 550 MG TABS, Take 550 mg by mouth daily., Disp: , Rfl:   Objective Blood pressure 114/80, pulse 87, height 5\' 5"  (1.651 m), weight 242 lb (109.8 kg).  Gen obese female NAD  Pertinent ROS No burning with urination, frequency or urgency No nausea, vomiting or diarrhea Nor fever chills or other constitutional symptoms   Labs or studies     Impression Diagnoses this Encounter::   ICD-10-CM   1. PMB (postmenopausal bleeding)  N95.0 [id number] PELVIS (TRANSABDOMINAL ONLY)    US PELVIS TRANSVAGINAL NON-OB (TV ONLY)      Established relevant diagnosis(es):   Plan/Recommendations: No orders of the defined types were placed in this encounter.   Labs or Scans Ordered: Orders Placed This Encounter  Procedures   US PELVIS (TRANSABDOMINAL ONLY)   US PELVIS TRANSVAGINAL NON-OB (TV ONLY)    Management:: PMB: evaluate her endometrium with sonogram and base her evaluation on the findings  Follow up Return for I will schedule after sonogram.     All questions were answered.

## 2021-03-19 ENCOUNTER — Other Ambulatory Visit (HOSPITAL_COMMUNITY)
Admission: RE | Admit: 2021-03-19 | Discharge: 2021-03-19 | Disposition: A | Discharge status: Home / Self Care | Payer: Medicare HMO | Source: Ambulatory Visit | Attending: Pulmonary Disease | Admitting: Pulmonary Disease

## 2021-03-19 DIAGNOSIS — Z01818 Encounter for other preprocedural examination: Secondary | ICD-10-CM

## 2021-03-20 ENCOUNTER — Other Ambulatory Visit: Payer: Self-pay | Admitting: Cardiology

## 2021-03-20 DIAGNOSIS — I10 Essential (primary) hypertension: Secondary | ICD-10-CM

## 2021-03-23 ENCOUNTER — Ambulatory Visit (HOSPITAL_COMMUNITY): Payer: Medicare HMO

## 2021-03-31 ENCOUNTER — Ambulatory Visit: Payer: Medicare HMO | Admitting: Pulmonary Disease

## 2021-03-31 ENCOUNTER — Other Ambulatory Visit: Payer: Self-pay

## 2021-03-31 ENCOUNTER — Encounter: Payer: Self-pay | Admitting: Pulmonary Disease

## 2021-03-31 DIAGNOSIS — R0683 Snoring: Secondary | ICD-10-CM | POA: Diagnosis not present

## 2021-03-31 DIAGNOSIS — J4540 Moderate persistent asthma, uncomplicated: Secondary | ICD-10-CM

## 2021-03-31 NOTE — Progress Notes (Signed)
Sent message, via epic in basket, requesting orders in epic from surgeon.  

## 2021-03-31 NOTE — Assessment & Plan Note (Signed)
Continue Symbicort.  Use albuterol as needed. Curious to see how much her asthma symptoms improve post hernia surgery

## 2021-03-31 NOTE — Assessment & Plan Note (Signed)
Home sleep test will be rescheduled 3 months to give her sufficient time to recover from the surgery

## 2021-03-31 NOTE — Patient Instructions (Signed)
°  Good luck with surgery  Reschedule PFTs in April Home sleep test in March

## 2021-03-31 NOTE — Progress Notes (Signed)
But she  Subjective:    Patient ID: Kathryn Olsen, female    DOB: 19-Feb-1951, 71 y.o.   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  Hiatal hernia surgery was previously delayed due to COVID infection and then she had deaths in her family.  She is finally scheduled for 1/31 Breathing is otherwise at baseline.  She is compliant with Symbicort  Significant tests/ events reviewed 11/2013 PFTs >> ratio 63 with FEV1 of 1.39 L (55% predicted) pre-bronchodilator, postbronchodilator ratio 73 FEV1 2.03 L (80% predicted, 46% change) DLCO of 99% predicted    Echo 11/2020 nml LVEF , RVSP 47 Echo 10/2019 Concentric LVH, moderate TR, RVSP 38   04/2020 Eos 300    Review of Systems neg for any significant sore throat, dysphagia, itching, sneezing, nasal congestion or excess/ purulent secretions, fever, chills, sweats, unintended wt loss, pleuritic or exertional cp, hempoptysis, orthopnea pnd or change in chronic leg swelling. Also denies presyncope, palpitations, heartburn, abdominal pain, nausea, vomiting, diarrhea or change in bowel or urinary habits, dysuria,hematuria, rash, arthralgias, visual complaints, headache, numbness weakness or ataxia.     Objective:   Physical Exam  Gen. Pleasant, obese, in no distress ENT - no lesions, no post nasal drip Neck: No JVD, no thyromegaly, no carotid bruits Lungs: no use of accessory muscles, no dullness to percussion, decreased without rales or rhonchi   Cardiovascular: Rhythm regular, heart sounds  normal, no murmurs or gallops, no peripheral edema Musculoskeletal: No deformities, no cyanosis or clubbing , no tremors       Assessment & Plan:   Preop evaluation -she is at mild to moderate risk for postop pulmonary complications given her 71 years old, upper abdominal surgery and moderate pulmonary hypertension. Caution to avoid preload drop during induction Early mobilization and DVT prophylaxis as would be routine. We will reassess pulmonary artery pressure after recovery from surgery and schedule PFTs

## 2021-04-07 NOTE — Patient Instructions (Addendum)
DUE TO COVID-19 ONLY ONE VISITOR IS ALLOWED TO COME WITH YOU AND STAY IN THE WAITING ROOM ONLY DURING PRE OP AND PROCEDURE.   **NO VISITORS ARE ALLOWED IN THE SHORT STAY AREA OR RECOVERY ROOM!!**  IF YOU WILL BE ADMITTED INTO THE HOSPITAL YOU ARE ALLOWED ONLY TWO SUPPORT PEOPLE DURING VISITATION HOURS ONLY (7 AM -8PM)   The support person(s) must pass our screening, gel in and out, and wear a mask at all times, including in the patients room. Patients must also wear a mask when staff or their support person are in the room. Visitors GUEST BADGE MUST BE WORN VISIBLY  One adult visitor may remain with you overnight and MUST be in the room by 8 P.M.  No visitors under the age of 73. Any visitor under the age of 73 must be accompanied by an adult.    COVID SWAB TESTING MUST BE COMPLETED ON:  04/09/21   [facility] [address] [place] Enter: Main Entrance have a seat in the waiting area to the right of main entrance (DO NOT CHECK IN AT ADMITTING!!!!!) Dial: [phone] to alert staff you have arrived  You are not required to quarantine, however you are required to wear a well-fitted mask when you are out and around people not in your household.  Hand Hygiene often Do NOT share personal items Notify your provider if you are in close contact with someone who has COVID or you develop fever 100.4 or greater, new onset of sneezing, cough, sore throat, shortness of breath or body aches.  [facility] Entrance [address], [address], must go inside of the hospital, NOT A DRIVE THRU!  (Must self quarantine after testing. Follow instructions on handout.)       Your procedure is scheduled on: 04/13/21   [facility] Main Entrance    Report to short stay at: 5:15 AM   Call this number if you have problems the morning of surgery [phone] Eat a light diet the day before surgery.  Examples including soups,  broths, toast, yogurt, mashed potatoes.  Things to avoid include carbonated beverages (fizzy beverages), raw fruits and raw vegetables, or beans.   If your bowels are filled with gas, your surgeon will have difficulty visualizing your pelvic organs which increases your surgical risks.    Do not eat food :After Midnight.   May have liquids until: 4:15 AM    day of surgery  CLEAR LIQUID DIET  Foods Allowed                                                                     Foods Excluded  Water, Black Coffee and tea, regular and decaf                             liquids that you cannot  Plain Jell-O in any flavor  (No red)  see through such as: Fruit ices (not with fruit pulp)                                     milk, soups, orange juice              Iced Popsicles (No red)                                    All solid food                                   Apple juices Sports drinks like Gatorade (No red) Lightly seasoned clear broth or consume(fat free) Sugar Sample Menu Breakfast                                Lunch                                     Supper Cranberry juice                    Beef broth                            Chicken broth Jell-O                                     Grape juice                           Apple juice Coffee or tea                        Jell-O                                      Popsicle                                                Coffee or tea                        Coffee or tea     Oral Hygiene is also important to reduce your risk of infection.                                    Remember - BRUSH YOUR TEETH THE MORNING OF SURGERY WITH YOUR REGULAR TOOTHPASTE   Do NOT smoke after Midnight   Take these medicines the morning of surgery with A SIP OF WATER:   DO NOT TAKE ANY ORAL DIABETIC MEDICATIONS DAY OF YOUR SURGERY  You may not have any metal on your body including hair  pins, jewelry, and body piercing             Do not wear make-up, lotions, powders, perfumes/cologne, or deodorant  Do not wear nail polish including gel and S&S, artificial/acrylic nails, or any other type of covering on natural nails including finger and toenails. If you have artificial nails, gel coating, etc. that needs to be removed by a nail salon please have this removed prior to surgery or surgery may need to be canceled/ delayed if the surgeon/ anesthesia feels like they are unable to be safely monitored.   Do not shave  48 hours prior to surgery.    Do not bring valuables to the hospital. [facility] IS NOT             RESPONSIBLE   FOR VALUABLES.   Contacts, dentures or bridgework may not be worn into surgery.   Bring small overnight bag day of surgery.    Patients discharged on the day of surgery will not be allowed to drive home.  Someone needs to stay with you for the first 24 hours after anesthesia.   Special Instructions: Bring a copy of your healthcare power of attorney and living will documents         the day of surgery if you haven't scanned them before.              Please read over the following fact sheets you were given: IF YOU HAVE QUESTIONS ABOUT YOUR PRE-OP INSTRUCTIONS PLEASE CALL [phone]     [facility] - Preparing for [facility] Before surgery, you can play an important role.  Because skin is not sterile, your skin needs to be as free of germs as possible.  You can reduce the number of germs on your skin by washing with CHG (chlorahexidine gluconate) soap before surgery.  CHG is an antiseptic cleaner which kills germs and bonds with the skin to continue killing germs even after washing. Please DO NOT use if you have an allergy to CHG or antibacterial soaps.  If your skin becomes reddened/irritated stop using the CHG and inform your nurse when you arrive at Short Stay. Do not shave (including legs and underarms) for at least 48 hours prior to the first CHG  shower.  You may shave your face/neck. Please follow these instructions carefully:  1.  Shower with CHG Soap the night before surgery and the  morning of Surgery.  2.  If you choose to wash your hair, wash your hair first as usual with your  normal  shampoo.  3.  After you shampoo, rinse your hair and body thoroughly to remove the  shampoo.                           4.  Use CHG as you would any other liquid soap.  You can apply chg directly  to the skin and wash                       Gently with a scrungie or clean washcloth.  5.  Apply the CHG Soap to your body ONLY FROM THE NECK DOWN.   Do not use on face/ open                           Wound or open sores. Avoid contact  with eyes, ears mouth and genitals (private parts).                       Wash face,  Genitals (private parts) with your normal soap.             6.  Wash thoroughly, paying special attention to the area where your surgery  will be performed.  7.  Thoroughly rinse your body with warm water from the neck down.  8.  DO NOT shower/wash with your normal soap after using and rinsing off  the CHG Soap.                9.  Pat yourself dry with a clean towel.            10.  Wear clean pajamas.            11.  Place clean sheets on your bed the night of your first shower and do not  sleep with pets. Day of Surgery : Do not apply any lotions/deodorants the morning of surgery.  Please wear clean clothes to the hospital/surgery center.  FAILURE TO FOLLOW THESE INSTRUCTIONS MAY RESULT IN THE CANCELLATION OF YOUR SURGERY PATIENT SIGNATURE_________________________________  NURSE SIGNATURE__________________________________  ________________________________________________________________________

## 2021-04-08 ENCOUNTER — Encounter (HOSPITAL_COMMUNITY)
Admission: RE | Admit: 2021-04-08 | Discharge: 2021-04-08 | Disposition: A | Discharge status: Home / Self Care | Payer: Medicare HMO | Source: Ambulatory Visit | Attending: Surgery | Admitting: Surgery

## 2021-04-08 ENCOUNTER — Other Ambulatory Visit: Payer: Self-pay

## 2021-04-08 ENCOUNTER — Encounter (HOSPITAL_COMMUNITY): Payer: Self-pay

## 2021-04-08 VITALS — BP 148/94 | HR 88 | Temp 98.2°F | Ht 65.0 in | Wt 241.0 lb

## 2021-04-08 DIAGNOSIS — Z01818 Encounter for other preprocedural examination: Secondary | ICD-10-CM | POA: Diagnosis present

## 2021-04-08 DIAGNOSIS — J449 Chronic obstructive pulmonary disease, unspecified: Secondary | ICD-10-CM | POA: Insufficient documentation

## 2021-04-08 DIAGNOSIS — K449 Diaphragmatic hernia without obstruction or gangrene: Secondary | ICD-10-CM | POA: Diagnosis not present

## 2021-04-08 DIAGNOSIS — G4730 Sleep apnea, unspecified: Secondary | ICD-10-CM | POA: Diagnosis not present

## 2021-04-08 DIAGNOSIS — I10 Essential (primary) hypertension: Secondary | ICD-10-CM | POA: Diagnosis not present

## 2021-04-08 HISTORY — DX: Cardiac arrhythmia, unspecified: I49.9

## 2021-04-08 LAB — CBC
HCT: 50.7 % — ABNORMAL HIGH (ref 36.0–46.0)
Hemoglobin: 16.3 g/dL — ABNORMAL HIGH (ref 12.0–15.0)
MCH: 28.5 pg (ref 26.0–34.0)
MCHC: 32.1 g/dL (ref 30.0–36.0)
MCV: 88.8 fL (ref 80.0–100.0)
Platelets: 289 10*3/uL (ref 150–400)
RBC: 5.71 MIL/uL — ABNORMAL HIGH (ref 3.87–5.11)
RDW: 13.2 % (ref 11.5–15.5)
WBC: 8 10*3/uL (ref 4.0–10.5)
nRBC: 0 % (ref 0.0–0.2)

## 2021-04-08 LAB — BASIC METABOLIC PANEL
Anion gap: 9 (ref 5–15)
BUN: 19 mg/dL (ref 8–23)
CO2: 27 mmol/L (ref 22–32)
Calcium: 9.4 mg/dL (ref 8.9–10.3)
Chloride: 105 mmol/L (ref 98–111)
Creatinine, Ser: 0.71 mg/dL (ref 0.44–1.00)
GFR, Estimated: >60 mL/min (ref >60)
Glucose, Bld: 97 mg/dL (ref 70–99)
Potassium: 3.4 mmol/L — ABNORMAL LOW (ref 3.5–5.1)
Sodium: 141 mmol/L (ref 135–145)

## 2021-04-08 NOTE — Anesthesia Preprocedure Evaluation (Addendum)
Anesthesia Evaluation  Patient identified by MRN, date of birth, ID band Patient awake    Reviewed: Allergy & Precautions, H&P , NPO status , Patient's Chart, lab work & pertinent test results  Airway Mallampati: III  TM Distance: >3 FB Neck ROM: Full    Dental no notable dental hx. (+) Poor Dentition, Dental Advisory Given   Pulmonary asthma , sleep apnea , COPD,  COPD inhaler,    Pulmonary exam normal breath sounds clear to auscultation       Cardiovascular Exercise Tolerance: Good hypertension, Pt. on medications  Rhythm:Regular Rate:Normal     Neuro/Psych Depression negative neurological ROS     GI/Hepatic Neg liver ROS, GERD  Medicated,  Endo/Other  Morbid obesity  Renal/GU negative Renal ROS  negative genitourinary   Musculoskeletal  (+) Arthritis , Osteoarthritis,    Abdominal   Peds  Hematology negative hematology ROS (+)   Anesthesia Other Findings   Reproductive/Obstetrics negative OB ROS                           Anesthesia Physical Anesthesia Plan  ASA: 3  Anesthesia Plan: General   Post-op Pain Management: Tylenol PO (pre-op)   Induction: Intravenous  PONV Risk Score and Plan: 4 or greater and Ondansetron, Dexamethasone and Treatment may vary due to age or medical condition  Airway Management Planned: Oral ETT  Additional Equipment:   Intra-op Plan:   Post-operative Plan: Extubation in OR  Informed Consent: I have reviewed the patients History and Physical, chart, labs and discussed the procedure including the risks, benefits and alternatives for the proposed anesthesia with the patient or authorized representative who has indicated his/her understanding and acceptance.     Dental advisory given  Plan Discussed with: CRNA  Anesthesia Plan Comments: (See PAT note 04/08/2021, Jodell Cipro Ward, PA-C)      Anesthesia Quick Evaluation

## 2021-04-08 NOTE — Progress Notes (Signed)
COVID Vaccine Completed: Yes Date COVID Vaccine completed: 12/25/19 x 2 COVID vaccine manufacturer: Pfizer     COVID Test: 04/13/21 PCP - Zoe Lan: NP Cardiologist - N/A Pulmunologist: Dr. Elisabeth Cara. : See note about recommendations : 03/31/21: EPIC Chest x-ray -  EKG -  Stress Test -  ECHO - 12/04/20 Cardiac Cath -  Pacemaker/ICD device last checked:  Sleep Study - NO CPAP - NO  Fasting Blood Sugar -  Checks Blood Sugar _____ times a day  Blood Thinner Instructions: Aspirin Instructions: Last Dose:  Anesthesia review: Hx: OSA(NO CPAP),HTN  Patient denies shortness of breath, fever, cough and chest pain at PAT appointment   Patient verbalized understanding of instructions that were given to them at the PAT appointment. Patient was also instructed that they will need to review over the PAT instructions again at home before surgery.

## 2021-04-08 NOTE — Progress Notes (Signed)
Anesthesia Chart Review   Case: [id number] Date/Time: 04/13/21 0700   Procedure: ROBOTIC REPAIR OF LARGE TYPE III HIATAL HERNIA WITH NISSEN   Anesthesia type: General   Pre-op diagnosis: HIATAL HERNIA   [facility] ROOM 02 / [facility]   Surgeons: Luretha Murphy, MD       DISCUSSION:71 y.o. never smoker with h/o COPD, sleep apnea, HTN, hiatal hernia scheduled for above procedure 04/13/2021 with Dr. Luretha Murphy.   Pt last seen by pulmonology 03/31/2021. Per OV note, "Preop evaluation -she is at mild to moderate risk for postop pulmonary complications given her advanced age, upper abdominal surgery and moderate pulmonary hypertension. Caution to avoid preload drop during induction Early mobilization and DVT prophylaxis as would be routine. We will reassess pulmonary artery pressure after recovery from surgery and schedule PFTs"  Anticipate pt can proceed with planned procedure barring acute status change.   VS: BP (!) 148/94    Pulse 88    Temp 36.8 C (Oral)    Ht 5\' 5"  (1.651 m)    Wt 109.3 kg    SpO2 96%    BMI 40.10 kg/m   PROVIDERS: , NP is PCP   Iona Hansen, MD is Pulmonologist  LABS: Labs reviewed: Acceptable for surgery. (all labs ordered are listed, but only abnormal results are displayed)  Labs Reviewed  BASIC METABOLIC PANEL - Abnormal; Notable for the following components:      Result Value   Potassium 3.4 (*)    All other components within normal limits  CBC - Abnormal; Notable for the following components:   RBC 5.71 (*)    Hemoglobin 16.3 (*)    HCT 50.7 (*)    All other components within normal limits     IMAGES:   EKG: 04/08/2021 Rate 81 bpm Sinus rhythm with Premature supraventricular complexes Incomplete right bundle branch block Left anterior fascicular block Abnormal ECG  CV: Echo 12/04/2020 1. Left ventricular ejection fraction, by estimation, is 60 to 65%. The  left ventricle has normal function. The left ventricle has no  regional  wall motion abnormalities. Left ventricular diastolic parameters are  indeterminate.   2. Right ventricular systolic function is normal. The right ventricular  size is mildly enlarged. There is moderately elevated pulmonary artery  systolic pressure.   3. Left atrial size was mildly dilated.   4. Right atrial size was mildly dilated.   5. The mitral valve is normal in structure. No evidence of mitral valve  regurgitation. No evidence of mitral stenosis.   6. The tricuspid valve is abnormal.   7. The aortic valve is tricuspid. Aortic valve regurgitation is not  visualized. No aortic stenosis is present.   8. The inferior vena cava is normal in size with greater than 50%  respiratory variability, suggesting right atrial pressure of 3 mmHg.  Past Medical History:  Diagnosis Date   Asthma    COPD (chronic obstructive pulmonary disease) (HCC)    questionable   Degeneration of intervertebral disc, site unspecified    Depression    DJD (degenerative joint disease)    Dyslipidemia    Dysrhythmia    GERD (gastroesophageal reflux disease)    Pneumonia 04/14/2013   Shortness of breath    on exertion   Sleep apnea    stopbang=4   Unspecified essential hypertension     Past Surgical History:  Procedure Laterality Date   APPENDECTOMY     ECTOPIC PREGNANCY SURGERY     KNEE ARTHROSCOPY  2010    Right knee   KNEE ARTHROSCOPY  2012   Left knee   TOTAL KNEE ARTHROPLASTY  11/22/2011   Procedure: TOTAL KNEE ARTHROPLASTY;  Surgeon: Drucilla Schmidt, MD;  Location: [facility];  Service: Orthopedics;  Laterality: Left;   TOTAL KNEE REVISION Left 09/23/2013   Procedure: REVISION LEFT TOTAL KNEE WITH POLY EXCHANGE UPSIZE AND PATELLA REVISION;  Surgeon: Shelda Pal, MD;  Location: [facility];  Service: Orthopedics;  Laterality: Left;    MEDICATIONS:  albuterol (PROVENTIL HFA;VENTOLIN HFA) 108 (90 Base) MCG/ACT inhaler   amLODipine (NORVASC) 5 MG tablet   aspirin EC 81 MG tablet    atorvastatin (LIPITOR) 10 MG tablet   budesonide-formoterol (SYMBICORT) 80-4.5 MCG/ACT inhaler   cholecalciferol (VITAMIN D3) 25 MCG (1000 UNIT) tablet   diltiazem (CARDIZEM) 30 MG tablet   furosemide (LASIX) 20 MG tablet   ibuprofen (ADVIL) 800 MG tablet   losartan-hydrochlorothiazide (HYZAAR) 100-25 MG tablet   magnesium oxide (MAG-OX) 400 MG tablet   meloxicam (MOBIC) 15 MG tablet   Multiple Vitamin (MULTIVITAMIN WITH MINERALS) TABS tablet   omeprazole (PRILOSEC) 20 MG capsule   Potassium Gluconate 550 MG TABS   triamcinolone (NASACORT) 55 MCG/ACT AERO nasal inhaler   zinc gluconate 50 MG tablet   No current facility-administered medications for this encounter.    Jodell Cipro Ward, PA-C [facility] [phone]

## 2021-04-12 NOTE — H&P (Signed)
Kathryn Cabal, MD  MRN: [id number] DOB: 14-Mar-1951  Chief Complaint: Follow-up hiatal hernia-large type III mixed   History of Present Illness: Kathryn Olsen is a 71 y.o. female who is seen today as an office consultation for re evaluation of Follow-up  of a large hiatal hernia scheduled last year for surgery.   . She is a 71 year old white female who I saw back in November 2021 and set up for hiatal hernia repair at that time. She however got COVID and they had a series of deaths in their parents and so she is put off surgery. She had a barium swallow at that time which showed normal esophageal motility and a large hiatal hernia containing the entire stomach. I have discussed laparoscopic repair with her and this time included robotic hiatal hernia repair with her using the possibility of biologic mesh. She understands and is ready to move forward.  Review of Systems: See HPI as well for other ROS.  ROS   Medical History: Past Medical History:  Diagnosis Date   Arthritis   Asthma, unspecified asthma severity, unspecified whether complicated, unspecified whether persistent   Hyperlipidemia   Hypertension   Patient Active Problem List  Diagnosis   Degeneration of intervertebral disc, site unspecified   Essential hypertension   Gastro-esophageal reflux disease without esophagitis   Vitamin D deficiency   S/P revision of total knee   Pulmonary hypertension, unspecified (CMS-HCC)   Hyperlipidemia, unspecified   History reviewed. No pertinent surgical history.   Allergies  Allergen Reactions   Opioids - Morphine Analogues Anaphylaxis and Itching   Ace Inhibitors Diarrhea and Other (See Comments)   Current Outpatient Medications on File Prior to Visit  Medication Sig Dispense Refill   amLODIPine (NORVASC) 5 MG tablet Take 5 mg by mouth every evening   atorvastatin (LIPITOR) 10 MG tablet Take 1 tablet by mouth once daily   budesonide-formoteroL (SYMBICORT)  160-4.5 mcg/actuation inhaler Inhale into the lungs   diltiazem (CARDIZEM) 30 MG tablet TAKE 1 TABLET(30 MG) BY MOUTH EVERY 8 HOURS AS NEEDED   FUROsemide (LASIX) 20 MG tablet Take 20 mg by mouth once daily   hydrOXYzine HCL (ATARAX) 10 MG tablet Take by mouth   losartan-hydrochlorothiazide (HYZAAR) 100-25 mg tablet Take 1 tablet by mouth once daily   meloxicam (MOBIC) 15 MG tablet TAKE 1 TABLET EVERY DAY AS NEEDED FOR PAIN   aspirin 81 MG EC tablet Take by mouth   magnesium 30 mg tablet Take by mouth   multivitamin capsule Take 1 capsule by mouth once daily   No current facility-administered medications on file prior to visit.   Family History  Problem Relation Age of Onset   Alcohol abuse Brother    Social History   Tobacco Use  Smoking Status Never  Smokeless Tobacco Never    Social History   Socioeconomic History   Marital status: Married  Tobacco Use   Smoking status: Never   Smokeless tobacco: Never  Substance and Sexual Activity   Alcohol use: Yes   Drug use: Never   Objective:   Vitals:  BP: (!) 146/82  Pulse: 94  SpO2: 96%  Weight: (!) 110 kg (242 lb 6.4 oz)  Height: 165.1 cm (5\' 5" )   Body mass index is 40.34 kg/m.  Physical Exam General: Normally developed white female in no acute distress that does note some impingement on her breathing with carbonated drinks. HEENT : No hoarseness noted Chest  clear Heart  SR without  murmurs Abdomen  nontender Neuro alert and oriented x3 motor and sensory function grossly intact. She does have good cognitive function and understands the reasons for the surgery and the risks.  Labs, Imaging and Diagnostic Testing: No new lab to discuss  Assessment and Plan:   Hiatal hernia with gastroesophageal reflux-mainly pressure and occasional dysphagia    This is a reboot of her surgery that was scheduled year ago. She reports no new changes in her health history. We will schedule on the robot for 3 hours at [facility].    Kathryn Olsen Charna Busman, MD

## 2021-04-13 ENCOUNTER — Observation Stay (HOSPITAL_COMMUNITY)
Admission: RE | Admit: 2021-04-13 | Discharge: 2021-04-15 | Disposition: A | Discharge status: Home / Self Care | Payer: Medicare HMO | Attending: Surgery | Admitting: Surgery

## 2021-04-13 ENCOUNTER — Encounter (HOSPITAL_COMMUNITY)
Admission: RE | Disposition: A | Discharge status: Home / Self Care | Payer: Self-pay | Source: Home / Self Care | Attending: Surgery

## 2021-04-13 ENCOUNTER — Ambulatory Visit (HOSPITAL_COMMUNITY): Payer: Medicare HMO | Admitting: Physician Assistant

## 2021-04-13 ENCOUNTER — Other Ambulatory Visit: Payer: Self-pay

## 2021-04-13 ENCOUNTER — Encounter (HOSPITAL_COMMUNITY): Payer: Self-pay | Admitting: Surgery

## 2021-04-13 DIAGNOSIS — K449 Diaphragmatic hernia without obstruction or gangrene: Secondary | ICD-10-CM | POA: Diagnosis present

## 2021-04-13 DIAGNOSIS — Z7901 Long term (current) use of anticoagulants: Secondary | ICD-10-CM | POA: Diagnosis not present

## 2021-04-13 DIAGNOSIS — Z20822 Contact with and (suspected) exposure to covid-19: Secondary | ICD-10-CM | POA: Diagnosis not present

## 2021-04-13 DIAGNOSIS — E785 Hyperlipidemia, unspecified: Secondary | ICD-10-CM | POA: Insufficient documentation

## 2021-04-13 DIAGNOSIS — Z79899 Other long term (current) drug therapy: Secondary | ICD-10-CM | POA: Insufficient documentation

## 2021-04-13 DIAGNOSIS — Z8719 Personal history of other diseases of the digestive system: Secondary | ICD-10-CM

## 2021-04-13 DIAGNOSIS — J45909 Unspecified asthma, uncomplicated: Secondary | ICD-10-CM | POA: Insufficient documentation

## 2021-04-13 DIAGNOSIS — Z8616 Personal history of COVID-19: Secondary | ICD-10-CM | POA: Insufficient documentation

## 2021-04-13 DIAGNOSIS — I10 Essential (primary) hypertension: Secondary | ICD-10-CM | POA: Insufficient documentation

## 2021-04-13 DIAGNOSIS — Z7982 Long term (current) use of aspirin: Secondary | ICD-10-CM | POA: Insufficient documentation

## 2021-04-13 DIAGNOSIS — K219 Gastro-esophageal reflux disease without esophagitis: Secondary | ICD-10-CM | POA: Diagnosis not present

## 2021-04-13 DIAGNOSIS — Z9889 Other specified postprocedural states: Secondary | ICD-10-CM

## 2021-04-13 HISTORY — PX: XI ROBOTIC ASSISTED HIATAL HERNIA REPAIR: SHX6889

## 2021-04-13 HISTORY — PX: UPPER GI ENDOSCOPY: SHX6162

## 2021-04-13 LAB — CBC
HCT: 50.3 % — ABNORMAL HIGH (ref 36.0–46.0)
Hemoglobin: 15.9 g/dL — ABNORMAL HIGH (ref 12.0–15.0)
MCH: 29.1 pg (ref 26.0–34.0)
MCHC: 31.6 g/dL (ref 30.0–36.0)
MCV: 92 fL (ref 80.0–100.0)
Platelets: 300 10*3/uL (ref 150–400)
RBC: 5.47 MIL/uL — ABNORMAL HIGH (ref 3.87–5.11)
RDW: 13.3 % (ref 11.5–15.5)
WBC: 20.6 10*3/uL — ABNORMAL HIGH (ref 4.0–10.5)
nRBC: 0 % (ref 0.0–0.2)

## 2021-04-13 LAB — CREATININE, SERUM
Creatinine, Ser: 1.26 mg/dL — ABNORMAL HIGH (ref 0.44–1.00)
GFR, Estimated: 45 mL/min — ABNORMAL LOW (ref >60)

## 2021-04-13 LAB — SARS CORONAVIRUS 2 BY RT PCR (HOSPITAL ORDER, PERFORMED IN ~~LOC~~ HOSPITAL LAB): SARS Coronavirus 2: NEGATIVE

## 2021-04-13 SURGERY — REPAIR, HERNIA, HIATAL, ROBOT-ASSISTED
Anesthesia: General | Site: Esophagus

## 2021-04-13 MED ORDER — CHLORHEXIDINE GLUCONATE CLOTH 2 % EX PADS: 6.0000 | MEDICATED_PAD | Freq: Once | CUTANEOUS | Status: DC

## 2021-04-13 MED ORDER — FENTANYL CITRATE (PF) 250 MCG/5ML IJ SOLN
INTRAMUSCULAR | Status: AC
  Filled 2021-04-13: qty 5

## 2021-04-13 MED ORDER — ROCURONIUM BROMIDE 10 MG/ML (PF) SYRINGE
PREFILLED_SYRINGE | INTRAVENOUS | Status: DC | PRN
  Administered 2021-04-13: 10 mg via INTRAVENOUS
  Administered 2021-04-13: 20 mg via INTRAVENOUS
  Administered 2021-04-13: 10 mg via INTRAVENOUS
  Administered 2021-04-13: 30 mg via INTRAVENOUS
  Administered 2021-04-13: 10 mg via INTRAVENOUS
  Administered 2021-04-13: 20 mg via INTRAVENOUS
  Administered 2021-04-13: 70 mg via INTRAVENOUS
  Administered 2021-04-13 (×2): 20 mg via INTRAVENOUS

## 2021-04-13 MED ORDER — HYDROMORPHONE HCL 1 MG/ML IJ SOLN
INTRAMUSCULAR | Status: AC
  Administered 2021-04-13: 0.5 mg via INTRAVENOUS
  Filled 2021-04-13: qty 2

## 2021-04-13 MED ORDER — PANTOPRAZOLE SODIUM 40 MG IV SOLR
40.0000 mg | Freq: Every day | INTRAVENOUS | Status: DC
  Administered 2021-04-13 – 2021-04-14 (×2): 40 mg via INTRAVENOUS
  Filled 2021-04-13 (×2): qty 40

## 2021-04-13 MED ORDER — HEPARIN SODIUM (PORCINE) 5000 UNIT/ML IJ SOLN
5000.0000 [IU] | Freq: Once | INTRAMUSCULAR | Status: AC
  Administered 2021-04-13: 5000 [IU] via SUBCUTANEOUS
  Filled 2021-04-13: qty 1

## 2021-04-13 MED ORDER — MIDAZOLAM HCL 2 MG/2ML IJ SOLN
INTRAMUSCULAR | Status: AC
  Filled 2021-04-13: qty 2

## 2021-04-13 MED ORDER — LIDOCAINE 2% (20 MG/ML) 5 ML SYRINGE
INTRAMUSCULAR | Status: DC | PRN
Start: 2021-04-13 — End: 2021-04-13
  Administered 2021-04-13: 1.5 mg/kg/h via INTRAVENOUS

## 2021-04-13 MED ORDER — BUPIVACAINE LIPOSOME 1.3 % IJ SUSP
INTRAMUSCULAR | Status: DC | PRN
  Administered 2021-04-13: 20 mL

## 2021-04-13 MED ORDER — ONDANSETRON HCL 4 MG/2ML IJ SOLN
INTRAMUSCULAR | Status: DC | PRN
  Administered 2021-04-13: 4 mg via INTRAVENOUS

## 2021-04-13 MED ORDER — SUGAMMADEX SODIUM 500 MG/5ML IV SOLN
INTRAVENOUS | Status: DC | PRN
  Administered 2021-04-13: 250 mg via INTRAVENOUS

## 2021-04-13 MED ORDER — ROCURONIUM BROMIDE 10 MG/ML (PF) SYRINGE
PREFILLED_SYRINGE | INTRAVENOUS | Status: AC
  Filled 2021-04-13: qty 10

## 2021-04-13 MED ORDER — SODIUM CHLORIDE (PF) 0.9 % IJ SOLN
INTRAMUSCULAR | Status: AC
  Filled 2021-04-13: qty 10

## 2021-04-13 MED ORDER — BUPIVACAINE LIPOSOME 1.3 % IJ SUSP
INTRAMUSCULAR | Status: AC
  Filled 2021-04-13: qty 20

## 2021-04-13 MED ORDER — SODIUM CHLORIDE (PF) 0.9 % IJ SOLN
INTRAMUSCULAR | Status: DC | PRN
  Administered 2021-04-13: 10 mL

## 2021-04-13 MED ORDER — FENTANYL CITRATE PF 50 MCG/ML IJ SOSY
PREFILLED_SYRINGE | INTRAMUSCULAR | Status: AC
  Administered 2021-04-13: 50 ug via INTRAVENOUS
  Filled 2021-04-13: qty 3

## 2021-04-13 MED ORDER — EPHEDRINE SULFATE-NACL 50-0.9 MG/10ML-% IV SOSY
PREFILLED_SYRINGE | INTRAVENOUS | Status: DC | PRN
Start: 2021-04-13 — End: 2021-04-13
  Administered 2021-04-13 (×2): 10 mg via INTRAVENOUS

## 2021-04-13 MED ORDER — FENTANYL CITRATE PF 50 MCG/ML IJ SOSY
25.0000 ug | PREFILLED_SYRINGE | INTRAMUSCULAR | Status: DC | PRN
  Administered 2021-04-13 (×2): 50 ug via INTRAVENOUS

## 2021-04-13 MED ORDER — KCL IN DEXTROSE-NACL 20-5-0.45 MEQ/L-%-% IV SOLN
INTRAVENOUS | Status: DC
  Filled 2021-04-13 (×2): qty 1000

## 2021-04-13 MED ORDER — HYDROMORPHONE HCL 1 MG/ML IJ SOLN
0.2500 mg | INTRAMUSCULAR | Status: DC | PRN
  Administered 2021-04-13 (×3): 0.25 mg via INTRAVENOUS

## 2021-04-13 MED ORDER — PROPOFOL 10 MG/ML IV BOLUS
INTRAVENOUS | Status: DC | PRN
  Administered 2021-04-13: 110 mg via INTRAVENOUS

## 2021-04-13 MED ORDER — FENTANYL CITRATE (PF) 250 MCG/5ML IJ SOLN
INTRAMUSCULAR | Status: DC | PRN
  Administered 2021-04-13 (×2): 50 ug via INTRAVENOUS
  Administered 2021-04-13: 100 ug via INTRAVENOUS
  Administered 2021-04-13 (×2): 50 ug via INTRAVENOUS

## 2021-04-13 MED ORDER — LIDOCAINE HCL 2 % IJ SOLN
INTRAMUSCULAR | Status: AC
  Filled 2021-04-13: qty 20

## 2021-04-13 MED ORDER — PROPOFOL 10 MG/ML IV BOLUS
INTRAVENOUS | Status: AC
  Filled 2021-04-13: qty 20

## 2021-04-13 MED ORDER — HEPARIN SODIUM (PORCINE) 5000 UNIT/ML IJ SOLN
5000.0000 [IU] | Freq: Three times a day (TID) | INTRAMUSCULAR | Status: DC
  Administered 2021-04-14 – 2021-04-15 (×5): 5000 [IU] via SUBCUTANEOUS
  Filled 2021-04-13 (×5): qty 1

## 2021-04-13 MED ORDER — LIDOCAINE HCL (PF) 2 % IJ SOLN
INTRAMUSCULAR | Status: AC
  Filled 2021-04-13: qty 5

## 2021-04-13 MED ORDER — PROCHLORPERAZINE EDISYLATE 10 MG/2ML IJ SOLN: 5.0000 mg | Freq: Four times a day (QID) | INTRAMUSCULAR | Status: DC | PRN

## 2021-04-13 MED ORDER — LACTATED RINGERS IR SOLN
Status: DC | PRN
  Administered 2021-04-13: 1000 mL

## 2021-04-13 MED ORDER — ONDANSETRON HCL 4 MG/2ML IJ SOLN
INTRAMUSCULAR | Status: AC
  Filled 2021-04-13: qty 2

## 2021-04-13 MED ORDER — PHENYLEPHRINE HCL (PRESSORS) 10 MG/ML IV SOLN
INTRAVENOUS | Status: AC
  Filled 2021-04-13: qty 1

## 2021-04-13 MED ORDER — ACETAMINOPHEN 500 MG PO TABS
1000.0000 mg | ORAL_TABLET | ORAL | Status: AC
  Administered 2021-04-13: 1000 mg via ORAL
  Filled 2021-04-13: qty 2

## 2021-04-13 MED ORDER — SUGAMMADEX SODIUM 500 MG/5ML IV SOLN
INTRAVENOUS | Status: AC
  Filled 2021-04-13: qty 5

## 2021-04-13 MED ORDER — CHLORHEXIDINE GLUCONATE 0.12 % MT SOLN
15.0000 mL | Freq: Once | OROMUCOSAL | Status: AC
  Administered 2021-04-13: 15 mL via OROMUCOSAL

## 2021-04-13 MED ORDER — CEFAZOLIN SODIUM-DEXTROSE 2-4 GM/100ML-% IV SOLN
2.0000 g | INTRAVENOUS | Status: AC
  Administered 2021-04-13 (×2): 2 g via INTRAVENOUS
  Filled 2021-04-13: qty 100

## 2021-04-13 MED ORDER — ORAL CARE MOUTH RINSE: 15.0000 mL | Freq: Once | OROMUCOSAL | Status: AC

## 2021-04-13 MED ORDER — MIDAZOLAM HCL 2 MG/2ML IJ SOLN
INTRAMUSCULAR | Status: DC | PRN
  Administered 2021-04-13 (×2): 1 mg via INTRAVENOUS

## 2021-04-13 MED ORDER — LACTATED RINGERS IV SOLN: INTRAVENOUS | Status: DC

## 2021-04-13 MED ORDER — METOPROLOL TARTRATE 5 MG/5ML IV SOLN: 5.0000 mg | Freq: Four times a day (QID) | INTRAVENOUS | Status: DC | PRN

## 2021-04-13 MED ORDER — CHLORHEXIDINE GLUCONATE CLOTH 2 % EX PADS
6.0000 | MEDICATED_PAD | Freq: Once | CUTANEOUS | Status: DC
Start: 2021-04-13 — End: 2021-04-13

## 2021-04-13 MED ORDER — DEXAMETHASONE SODIUM PHOSPHATE 10 MG/ML IJ SOLN
INTRAMUSCULAR | Status: DC | PRN
Start: 2021-04-13 — End: 2021-04-13
  Administered 2021-04-13: 10 mg via INTRAVENOUS

## 2021-04-13 MED ORDER — ONDANSETRON HCL 4 MG/2ML IJ SOLN
4.0000 mg | Freq: Four times a day (QID) | INTRAMUSCULAR | Status: DC | PRN
Start: 2021-04-13 — End: 2021-04-15

## 2021-04-13 MED ORDER — LACTATED RINGERS IV SOLN: INTRAVENOUS | Status: DC | PRN

## 2021-04-13 MED ORDER — LIDOCAINE 2% (20 MG/ML) 5 ML SYRINGE
INTRAMUSCULAR | Status: DC | PRN
  Administered 2021-04-13: 60 mg via INTRAVENOUS

## 2021-04-13 MED ORDER — BUPIVACAINE LIPOSOME 1.3 % IJ SUSP: 20.0000 mL | Freq: Once | INTRAMUSCULAR | Status: DC

## 2021-04-13 MED ORDER — PHENYLEPHRINE HCL-NACL 20-0.9 MG/250ML-% IV SOLN
INTRAVENOUS | Status: DC | PRN
  Administered 2021-04-13: 50 ug/min via INTRAVENOUS

## 2021-04-13 MED ORDER — ONDANSETRON 4 MG PO TBDP: 4.0000 mg | ORAL_TABLET | Freq: Four times a day (QID) | ORAL | Status: DC | PRN

## 2021-04-13 MED ORDER — CEFAZOLIN SODIUM-DEXTROSE 2-4 GM/100ML-% IV SOLN
INTRAVENOUS | Status: AC
  Filled 2021-04-13: qty 100

## 2021-04-13 MED ORDER — STERILE WATER FOR IRRIGATION IR SOLN
Status: DC | PRN
Start: 2021-04-13 — End: 2021-04-13
  Administered 2021-04-13: 500 mL

## 2021-04-13 MED ORDER — SCOPOLAMINE 1 MG/3DAYS TD PT72
1.0000 | MEDICATED_PATCH | TRANSDERMAL | Status: DC
  Administered 2021-04-13: 1.5 mg via TRANSDERMAL
  Filled 2021-04-13: qty 1

## 2021-04-13 MED ORDER — PROCHLORPERAZINE MALEATE 10 MG PO TABS
10.0000 mg | ORAL_TABLET | Freq: Four times a day (QID) | ORAL | Status: DC | PRN
  Filled 2021-04-13: qty 1

## 2021-04-13 MED ORDER — DEXAMETHASONE SODIUM PHOSPHATE 10 MG/ML IJ SOLN
INTRAMUSCULAR | Status: AC
  Filled 2021-04-13: qty 1

## 2021-04-13 MED ORDER — 0.9 % SODIUM CHLORIDE (POUR BTL) OPTIME
TOPICAL | Status: DC | PRN
  Administered 2021-04-13: 1000 mL

## 2021-04-13 MED ORDER — FENTANYL CITRATE PF 50 MCG/ML IJ SOSY
12.5000 ug | PREFILLED_SYRINGE | INTRAMUSCULAR | Status: DC | PRN
  Administered 2021-04-13 – 2021-04-14 (×3): 12.5 ug via INTRAVENOUS
  Filled 2021-04-13 (×3): qty 1

## 2021-04-13 MED ORDER — CEFAZOLIN SODIUM-DEXTROSE 2-4 GM/100ML-% IV SOLN
2.0000 g | Freq: Three times a day (TID) | INTRAVENOUS | Status: AC
  Administered 2021-04-13: 2 g via INTRAVENOUS
  Filled 2021-04-13: qty 100

## 2021-04-13 MED ORDER — ACETAMINOPHEN 500 MG PO TABS: 1000.0000 mg | ORAL_TABLET | Freq: Once | ORAL | Status: DC

## 2021-04-13 SURGICAL SUPPLY — 73 items
APPLICATOR COTTON TIP 6 STRL (MISCELLANEOUS) IMPLANT
APPLICATOR COTTON TIP 6IN STRL (MISCELLANEOUS) ×3
APPLIER CLIP 5 13 M/L LIGAMAX5 (MISCELLANEOUS)
APPLIER CLIP ROT 13.4 12 LRG (CLIP)
BLADE SURG 15 STRL LF DISP TIS (BLADE) ×2 IMPLANT
BLADE SURG 15 STRL SS (BLADE) ×1
CANNULA REDUC XI 12-8 STAPL (CANNULA) ×1
CANNULA REDUCER 12-8 DVNC XI (CANNULA) IMPLANT
CLIP APPLIE 5 13 M/L LIGAMAX5 (MISCELLANEOUS) IMPLANT
CLIP APPLIE ROT 13.4 12 LRG (CLIP) IMPLANT
CLIP LIGATING HEM O LOK PURPLE (MISCELLANEOUS) IMPLANT
COVER SURGICAL LIGHT HANDLE (MISCELLANEOUS) ×3 IMPLANT
COVER TIP SHEARS 8 DVNC (MISCELLANEOUS) ×2 IMPLANT
COVER TIP SHEARS 8MM DA VINCI (MISCELLANEOUS) ×1
DECANTER SPIKE VIAL GLASS SM (MISCELLANEOUS) ×3 IMPLANT
DERMABOND ADVANCED (GAUZE/BANDAGES/DRESSINGS) ×1
DERMABOND ADVANCED .7 DNX12 (GAUZE/BANDAGES/DRESSINGS) ×2 IMPLANT
DEVICE TROCAR PUNCTURE CLOSURE (ENDOMECHANICALS) IMPLANT
DRAIN PENROSE 0.5X18 (DRAIN) ×3 IMPLANT
DRAPE ARM DVNC X/XI (DISPOSABLE) ×8 IMPLANT
DRAPE COLUMN DVNC XI (DISPOSABLE) ×2 IMPLANT
DRAPE DA VINCI XI ARM (DISPOSABLE) ×4
DRAPE DA VINCI XI COLUMN (DISPOSABLE) ×1
DRIVER NDL MEGA SUTCUT DVNCXI (INSTRUMENTS) IMPLANT
DRIVER NDLE MEGA SUTCUT DVNCXI (INSTRUMENTS) ×2 IMPLANT
DRIVER NEEDLE MEGA SUTURECU XI (INSTRUMENTS) ×1
ELECT REM PT RETURN 15FT ADLT (MISCELLANEOUS) ×3 IMPLANT
GAUZE 4X4 16PLY ~~LOC~~+RFID DBL (SPONGE) ×3 IMPLANT
GLOVE SURG ENC TEXT LTX SZ8 (GLOVE) ×6 IMPLANT
GOWN STRL REUS W/TWL XL LVL3 (GOWN DISPOSABLE) ×12 IMPLANT
GRASPER SUT TROCAR 14GX15 (MISCELLANEOUS) ×1 IMPLANT
IRRIG SUCT STRYKERFLOW 2 WTIP (MISCELLANEOUS) ×3
IRRIGATION SUCT STRKRFLW 2 WTP (MISCELLANEOUS) ×2 IMPLANT
KIT BASIN OR (CUSTOM PROCEDURE TRAY) ×3 IMPLANT
KIT TURNOVER KIT A (KITS) IMPLANT
MARKER SKIN DUAL TIP RULER LAB (MISCELLANEOUS) ×3 IMPLANT
MAT PREVALON FULL STRYKER (MISCELLANEOUS) ×1 IMPLANT
MESH BIO-A 7X10 SYN MAT (Mesh General) ×1 IMPLANT
NEEDLE HYPO 22GX1.5 SAFETY (NEEDLE) ×3 IMPLANT
OBTURATOR OPTICAL STANDARD 8MM (TROCAR) ×1
OBTURATOR OPTICAL STND 8 DVNC (TROCAR) ×2
OBTURATOR OPTICALSTD 8 DVNC (TROCAR) ×2 IMPLANT
PACK CARDIOVASCULAR III (CUSTOM PROCEDURE TRAY) ×3 IMPLANT
PAD POSITIONING PINK XL (MISCELLANEOUS) IMPLANT
SCISSORS LAP 5X45 EPIX DISP (ENDOMECHANICALS) ×1 IMPLANT
SEAL CANN UNIV 5-8 DVNC XI (MISCELLANEOUS) ×8 IMPLANT
SEAL XI 5MM-8MM UNIVERSAL (MISCELLANEOUS) ×4
SEALER VESSEL DA VINCI XI (MISCELLANEOUS) ×1
SEALER VESSEL EXT DVNC XI (MISCELLANEOUS) ×2 IMPLANT
SOL ANTI FOG 6CC (MISCELLANEOUS) ×2 IMPLANT
SOLUTION ANTI FOG 6CC (MISCELLANEOUS) ×1
SOLUTION ELECTROLUBE (MISCELLANEOUS) ×3 IMPLANT
STAPLER CANNULA SEAL DVNC XI (STAPLE) IMPLANT
STAPLER CANNULA SEAL XI (STAPLE) ×1
SUT ETHIBOND 0 36 GRN (SUTURE) ×10 IMPLANT
SUT MNCRL AB 4-0 PS2 18 (SUTURE) ×7 IMPLANT
SUT PROLENE 2 0 SH DA (SUTURE) ×1 IMPLANT
SUT SILK 0 SH 30 (SUTURE) ×4 IMPLANT
SUT SILK 2 0 SH (SUTURE) ×3 IMPLANT
SUT VICRYL 0 UR6 27IN ABS (SUTURE) IMPLANT
SYR 10ML ECCENTRIC (SYRINGE) ×3 IMPLANT
SYR 20ML LL LF (SYRINGE) ×3 IMPLANT
TIP INNERVISION DETACH 40FR (MISCELLANEOUS) IMPLANT
TIP INNERVISION DETACH 50FR (MISCELLANEOUS) IMPLANT
TIP INNERVISION DETACH 56FR (MISCELLANEOUS) ×1 IMPLANT
TIPS INNERVISION DETACH 40FR (MISCELLANEOUS)
TOWEL OR 17X26 10 PK STRL BLUE (TOWEL DISPOSABLE) ×3 IMPLANT
TRAY FOLEY MTR SLVR 14FR STAT (SET/KITS/TRAYS/PACK) ×1 IMPLANT
TROCAR ADV FIXATION 12X100MM (TROCAR) ×3 IMPLANT
TROCAR ADV FIXATION 5X100MM (TROCAR) IMPLANT
TROCAR BLADELESS OPT 5 100 (ENDOMECHANICALS) ×3 IMPLANT
TUBING CONNECTING 10 (TUBING) IMPLANT
TUBING INSUFFLATION 10FT LAP (TUBING) ×3 IMPLANT

## 2021-04-13 NOTE — Anesthesia Postprocedure Evaluation (Signed)
Anesthesia Post Note  Patient: Kathryn Olsen  Procedure(s) Performed: ROBOTIC REPAIR OF LARGE TYPE III HIATAL HERNIA WITH NISSEN, AND GASTROPEXY (Abdomen) UPPER GI ENDOSCOPY (Esophagus)     Patient location during evaluation: PACU Anesthesia Type: General Level of consciousness: awake and alert Pain management: pain level controlled Vital Signs Assessment: post-procedure vital signs reviewed and stable Respiratory status: spontaneous breathing, nonlabored ventilation, respiratory function stable and patient connected to nasal cannula oxygen Cardiovascular status: blood pressure returned to baseline and stable Postop Assessment: no apparent nausea or vomiting Anesthetic complications: no   No notable events documented.  Last Vitals:  Vitals:   04/13/21 1501 04/13/21 1515  BP:  123/69  Pulse: 79 85  Resp: 12 (!) 9  Temp:    SpO2: 96% 92%    Last Pain:  Vitals:   04/13/21 1515  TempSrc:   PainSc: Asleep                 Janmichael Giraud,W. EDMOND

## 2021-04-13 NOTE — Progress Notes (Signed)
°  Transition of Care [facility]) Screening Note   Patient Details  Name: Kathryn Olsen Date of Birth: [birth year]   Transition of Care [facility]) CM/SW Contact:    Amada Jupiter, LCSW Phone Number: 04/13/2021, 3:54 PM    Transition of Care Department [facility]) has reviewed patient and no TOC needs have been identified at this time. We will continue to monitor patient advancement through interdisciplinary progression rounds. If new patient transition needs arise, please place a TOC consult.

## 2021-04-13 NOTE — Transfer of Care (Signed)
Immediate Anesthesia Transfer of Care Note  Patient: Kathryn Olsen  Procedure(s) Performed: ROBOTIC REPAIR OF LARGE TYPE III HIATAL HERNIA WITH NISSEN, AND GASTROPEXY (Abdomen) UPPER GI ENDOSCOPY (Esophagus)  Patient Location: PACU  Anesthesia Type:General  Level of Consciousness: drowsy  Airway & Oxygen Therapy: Patient Spontanous Breathing and Patient connected to face mask oxygen  Post-op Assessment: Report given to RN and Post -op Vital signs reviewed and stable  Post vital signs: Reviewed and stable  Last Vitals:  Vitals Value Taken Time  BP 143/74 04/13/21 1331  Temp    Pulse 86 04/13/21 1333  Resp 14 04/13/21 1333  SpO2 96 % 04/13/21 1333  Vitals shown include unvalidated device data.  Last Pain:  Vitals:   04/13/21 0628  TempSrc: Oral  PainSc: 5          Complications: No notable events documented.

## 2021-04-13 NOTE — Interval H&P Note (Signed)
History and Physical Interval Note:  04/13/2021 7:21 AM  Kathryn Olsen  has presented today for surgery, with the diagnosis of HIATAL HERNIA.  The various methods of treatment have been discussed with the patient and family. After consideration of risks, benefits and other options for treatment, the patient has consented to  Procedure(s): ROBOTIC REPAIR OF LARGE TYPE III HIATAL HERNIA WITH NISSEN (N/A) as a surgical intervention.  The patient's history has been reviewed, patient examined, no change in status, stable for surgery.  I have reviewed the patient's chart and labs.  Questions were answered to the patient's satisfaction.     Valarie Merino

## 2021-04-13 NOTE — Op Note (Signed)
Kathryn Olsen  [birth year]   04/13/2021    PCP:  Berkley Harvey, NP   Surgeon: Kaylyn Lim, MD, FACS  Asst:  Greer Pickerel, MD, FACS, Romana Juniper, MD, FACS  Anes:  general  Preop Dx: Stomach in the chest; large type III mixed hiatal hernia Postop Dx: same  Procedure: Robotic XI takedown of large hiatal hernia; endoscopy; closure with three fig of 8 sutures of 0 Surgidek, Endoscopy, GorTex BioA absorbable mesh secured in 3 places with 2-0 silk; Toupet fundoplication with silk sutures; gastropexy with 2-0 prolene transfascial--(5 hours) Location Surgery: [facility] 2 Complications:  None noted  EBL:   20 cc  Drains: none  Description of Procedure:  The patient was taken to OR 2 .  After anesthesia was administered and the patient was prepped  with chloroprep  and a timeout was performed.  Access to the abdomen was achieved with a 5 mm Optiview on the right side because there was a palpable density in the left upper quadrant.  Abdomen was surveyed and there appeared to be a lot of adhesions of omentum to the left anterior abdominal wall.  After the second trocar was placed which was robotic trocar used scissors to take down the adhesions.  Placed 4 8 mm robotic trochars across the abdomen and we had a 5 mm in the upper abdomen for retraction of the liver and then assistant was a another 5 in the left lower quadrant.  Eventually we upscale the robotic on the left side to a 12 to accommodate mesh placement.  The robot was docked with the lateral 2 ports on the right for the tip up and fenestrated bipolar and the camera was over on the left side along with the vessel sealer.  Dissection began on the patient's right side and this incising the right crus and then grasping the sac and peeling it away from the surrounding tissue.  This came down pretty well.  There was a membrane separating it from the left side and when it jumped across that and stripped out the left sac again using the robotic  arms and with gentle dissection and minimal use of the electrocautery.  This was carried laterally and then posteriorly where I could visualize both the right and left crura.  There was a very large defect present.  Endoscope was passed and the esophagus was now fairly strength straight at length and the GE junction appeared to be just out of slightly below the diaphragm.  When I completed the endoscopy went down in and mobilized more of the posterior tissue of the esophagus and laterally on the right on the right side.  Eventually these sacs were removed and debrided.  The repair was begun posteriorly with figure-of-eight sutures of 0 Surgidek x3.  This reduced the size of the opening although the repair was under some tension just because of the size the defect in the right crus was rather stringy.  The Gore-Tex Bio-A was inserted with a in a reverse the pattern and was tacked on the right side with 2 oh silks and then a horizontal mattress suture was placed on the left side to the crura.  Unrestrained been used to facilitate dissection and this was removed.  Some short gastrics had been taken down the course of the dissection and upper portion of the fundus cardia was grasped and brought around and sutured along the right side with 3 sutures of 0-silk.  Likewise portion of the anterior stomach was brought up  and sutured along the left side 3 sutures of 0 silk.  This was done with the 56 dilator in place.  Dilator was removed and the robot undocked.  Laparoscopic needle driver was used to place a transfascial 2-0 Prolene  pexy suture down along the greater curvature tacking it up to the left upper quadrant.  The 12 mm port was closed with a 0 vicryl.  The skin was closed with 4-0 Monocryl and Dermabond.   The patient tolerated the procedure well and was taken to the PACU in stable condition.     Matt B. Hassell Done, [place], [facility], [place]

## 2021-04-13 NOTE — Anesthesia Procedure Notes (Signed)
Procedure Name: Intubation Date/Time: 04/13/2021 7:33 AM Performed by: Florene Route, CRNA Pre-anesthesia Checklist: Patient identified, Emergency Drugs available, Suction available and Patient being monitored Patient Re-evaluated:Patient Re-evaluated prior to induction Oxygen Delivery Method: Circle system utilized Preoxygenation: Pre-oxygenation with 100% oxygen Induction Type: IV induction Ventilation: Mask ventilation without difficulty Laryngoscope Size: Miller and 2 Grade View: Grade I Tube type: Oral Tube size: 7.5 mm Number of attempts: 1 Airway Equipment and Method: Stylet Placement Confirmation: ETT inserted through vocal cords under direct vision, positive ETCO2 and breath sounds checked- equal and bilateral Secured at: 22 cm Tube secured with: Tape Dental Injury: Teeth and Oropharynx as per pre-operative assessment

## 2021-04-13 NOTE — Progress Notes (Signed)
Notified anesthesiologist Dr. Sampson Goon of patient's uncontrolled pain after giving ordered PACU pain medication. Order for dilaudid IV given. Allergies list buprenorphine with high severity reaction. This RN spoke with Pharmacist Erin to review. Per Denny Peon, pt has received dilaudid in the past (2013 and 2016) with no reported reaction. Per patient, she has taken dilaudid and tolerated will. Dilaudid given in PACU with no reaction.

## 2021-04-14 ENCOUNTER — Encounter (HOSPITAL_COMMUNITY): Payer: Self-pay | Admitting: Surgery

## 2021-04-14 DIAGNOSIS — K449 Diaphragmatic hernia without obstruction or gangrene: Secondary | ICD-10-CM | POA: Diagnosis not present

## 2021-04-14 LAB — COMPREHENSIVE METABOLIC PANEL
ALT: 60 U/L — ABNORMAL HIGH (ref 0–44)
AST: 82 U/L — ABNORMAL HIGH (ref 15–41)
Albumin: 3.6 g/dL (ref 3.5–5.0)
Alkaline Phosphatase: 62 U/L (ref 38–126)
Anion gap: 6 (ref 5–15)
BUN: 13 mg/dL (ref 8–23)
CO2: 24 mmol/L (ref 22–32)
Calcium: 8.9 mg/dL (ref 8.9–10.3)
Chloride: 109 mmol/L (ref 98–111)
Creatinine, Ser: 0.59 mg/dL (ref 0.44–1.00)
GFR, Estimated: >60 mL/min (ref >60)
Glucose, Bld: 144 mg/dL — ABNORMAL HIGH (ref 70–99)
Potassium: 4.4 mmol/L (ref 3.5–5.1)
Sodium: 139 mmol/L (ref 135–145)
Total Bilirubin: 0.3 mg/dL (ref 0.3–1.2)
Total Protein: 6.4 g/dL — ABNORMAL LOW (ref 6.5–8.1)

## 2021-04-14 LAB — CBC
HCT: 46.2 % — ABNORMAL HIGH (ref 36.0–46.0)
Hemoglobin: 14.6 g/dL (ref 12.0–15.0)
MCH: 28.9 pg (ref 26.0–34.0)
MCHC: 31.6 g/dL (ref 30.0–36.0)
MCV: 91.3 fL (ref 80.0–100.0)
Platelets: 221 10*3/uL (ref 150–400)
RBC: 5.06 MIL/uL (ref 3.87–5.11)
RDW: 13.2 % (ref 11.5–15.5)
WBC: 11.5 10*3/uL — ABNORMAL HIGH (ref 4.0–10.5)
nRBC: 0 % (ref 0.0–0.2)

## 2021-04-14 MED ORDER — OXYCODONE-ACETAMINOPHEN 5-325 MG PO TABS
1.0000 | ORAL_TABLET | ORAL | Status: DC | PRN
  Administered 2021-04-14 – 2021-04-15 (×5): 1 via ORAL
  Filled 2021-04-14 (×5): qty 1

## 2021-04-14 MED ORDER — OXYCODONE-ACETAMINOPHEN 5-325 MG PO TABS: 1.0000 | ORAL_TABLET | ORAL | Status: DC | PRN

## 2021-04-14 NOTE — Progress Notes (Signed)
Patient ID: Kathryn Olsen, female   DOB: 1950/06/11, 71 y.o.   MRN: [id number] [facility] Surgery Progress Note:   1 Day Post-Op  Subjective: Mental status is alert.  Complaints some pain in chest but not bad. Objective: Vital signs in last 24 hours: Temp:  [97.7 F (36.5 C)-98.9 F (37.2 C)] 97.9 F (36.6 C) (02/01 0523) Pulse Rate:  [68-92] 68 (02/01 0523) Resp:  [9-18] 14 (02/01 0523) BP: (117-143)/(68-100) 135/72 (02/01 0523) SpO2:  [88 %-96 %] 88 % (02/01 0523)  Intake/Output from previous day: 01/31 0701 - 02/01 0700 In: 3436.6 [P.O.:360; I.V.:2876.6; IV Piggyback:200] Out: 2580 [Urine:2530; Blood:50] Intake/Output this shift: No intake/output data recorded.  Physical Exam: Work of breathing is normal.  Incisions with Dermabond  Lab Results:  Results for orders placed or performed during the hospital encounter of 04/13/21 (from the past 48 hour(s))  SARS Coronavirus 2 by RT PCR (hospital order, performed in [facility] hospital lab) Nasopharyngeal Nasopharyngeal Swab     Status: None   Collection Time: 04/13/21  5:38 AM   Specimen: Nasopharyngeal Swab  Result Value Ref Range   SARS Coronavirus 2 NEGATIVE NEGATIVE    Comment: (NOTE) SARS-CoV-2 target nucleic acids are NOT DETECTED.  The SARS-CoV-2 RNA is generally detectable in upper and lower respiratory specimens during the acute phase of infection. The lowest concentration of SARS-CoV-2 viral copies this assay can detect is 250 copies / mL. A negative result does not preclude SARS-CoV-2 infection and should not be used as the sole basis for treatment or other patient management decisions.  A negative result may occur with improper specimen collection / handling, submission of specimen other than nasopharyngeal swab, presence of viral mutation(s) within the areas targeted by this assay, and inadequate number of viral copies (<250 copies / mL). A negative result must be combined with  clinical observations, patient history, and epidemiological information.  Fact Sheet for Patients:   StrictlyIdeas.no  Fact Sheet for Healthcare Providers: BankingDealers.co.za  This test is not yet approved or  cleared by the [place] FDA and has been authorized for detection and/or diagnosis of SARS-CoV-2 by FDA under an Emergency Use Authorization (EUA).  This EUA will remain in effect (meaning this test can be used) for the duration of the COVID-19 declaration under Section 564(b)(1) of the Act, 21 U.S.C. section 360bbb-3(b)(1), unless the authorization is terminated or revoked sooner.  Performed at [facility], [place] [address], [place], [place] [address]   CBC     Status: Abnormal   Collection Time: 04/13/21  2:51 PM  Result Value Ref Range   WBC 20.6 (H) 4.0 - 10.5 K/uL   RBC 5.47 (H) 3.87 - 5.11 MIL/uL   Hemoglobin 15.9 (H) 12.0 - 15.0 g/dL   HCT 50.3 (H) 36.0 - 46.0 %   MCV 92.0 80.0 - 100.0 fL   MCH 29.1 26.0 - 34.0 pg   MCHC 31.6 30.0 - 36.0 g/dL   RDW 13.3 11.5 - 15.5 %   Platelets 300 150 - 400 K/uL   nRBC 0.0 0.0 - 0.2 %    Comment: Performed at [facility], [place] [address] [place], [place] [address]  Creatinine, serum     Status: Abnormal   Collection Time: 04/13/21  3:41 PM  Result Value Ref Range   Creatinine, Ser 1.26 (H) 0.44 - 1.00 mg/dL   GFR, Estimated 45 (L) >60 mL/min    Comment: (NOTE) Calculated using the CKD-EPI Creatinine Equation (2021) Performed  at [facility], [place] [address], [place], [place] [address]   Comprehensive metabolic panel     Status: Abnormal   Collection Time: 04/14/21  4:32 AM  Result Value Ref Range   Sodium 139 135 - 145 mmol/L   Potassium 4.4 3.5 - 5.1 mmol/L   Chloride 109 98 - 111 mmol/L   CO2 24 22 - 32 mmol/L   Glucose, Bld 144 (H) 70 - 99 mg/dL    Comment: Glucose reference range applies only to samples  taken after fasting for at least 8 hours.   BUN 13 8 - 23 mg/dL   Creatinine, Ser 0.59 0.44 - 1.00 mg/dL   Calcium 8.9 8.9 - 10.3 mg/dL   Total Protein 6.4 (L) 6.5 - 8.1 g/dL   Albumin 3.6 3.5 - 5.0 g/dL   AST 82 (H) 15 - 41 U/L   ALT 60 (H) 0 - 44 U/L   Alkaline Phosphatase 62 38 - 126 U/L   Total Bilirubin 0.3 0.3 - 1.2 mg/dL   GFR, Estimated >60 >60 mL/min    Comment: (NOTE) Calculated using the CKD-EPI Creatinine Equation (2021)    Anion gap 6 5 - 15    Comment: Performed at [facility], [place] [address] [place], [place] [address]  CBC     Status: Abnormal   Collection Time: 04/14/21  4:32 AM  Result Value Ref Range   WBC 11.5 (H) 4.0 - 10.5 K/uL   RBC 5.06 3.87 - 5.11 MIL/uL   Hemoglobin 14.6 12.0 - 15.0 g/dL   HCT 46.2 (H) 36.0 - 46.0 %   MCV 91.3 80.0 - 100.0 fL   MCH 28.9 26.0 - 34.0 pg   MCHC 31.6 30.0 - 36.0 g/dL   RDW 13.2 11.5 - 15.5 %   Platelets 221 150 - 400 K/uL   nRBC 0.0 0.0 - 0.2 %    Comment: Performed at [facility], [place] [address] [place], [place] [address]    Radiology/Results: No results found.  Anti-infectives: Anti-infectives (From admission, onward)    Start     Dose/Rate Route Frequency Ordered Stop   04/13/21 2000  ceFAZolin (ANCEF) IVPB 2g/100 mL premix        2 g 200 mL/hr over 30 Minutes Intravenous Every 8 hours 04/13/21 1530 04/13/21 2017   04/13/21 1223  ceFAZolin (ANCEF) 2-4 GM/100ML-% IVPB       Note to Pharmacy: Maudry Diego C: cabinet override      04/13/21 1223 04/14/21 0029   04/13/21 0600  ceFAZolin (ANCEF) IVPB 2g/100 mL premix        2 g 200 mL/hr over 30 Minutes Intravenous On call to O.R. 04/13/21 0533 04/13/21 1235       Assessment/Plan: Problem List: Patient Active Problem List   Diagnosis Date Noted   History of repair of hiatal hernia 04/13/2021   Snoring 11/18/2020   PSVT (paroxysmal supraventricular tachycardia) ([place]) 12/19/2019   Sinus pause 12/19/2019    Pulmonary hypertension, unspecified ([place]) 11/28/2019   Irregular heart beat 10/14/2019   Dizziness 10/14/2019   Asthma, chronic 11/29/2013   Other malaise and fatigue 11/29/2013   Shortness of breath 10/28/2013   Obesity, unspecified 10/28/2013   S/P left knee revision 09/23/2013   Exertional dyspnea 08/09/2013   Preoperative cardiovascular examination 08/09/2013   Pre-operative cardiovascular examination 08/09/2013   Chest pain 08/09/2013   Dyspnea 08/09/2013   Essential hypertension 08/09/2013   Nonspecific (abnormal) findings on radiological and other  examination of gastrointestinal tract 04/26/2011   Esophageal reflux 04/26/2011   Chest pain, unspecified 04/26/2011   Special screening for malignant neoplasms, colon 04/26/2011   DYSLIPIDEMIA 09/03/2008   Malignant hypertension 09/03/2008   DEGENERATIVE DISC DISEASE 09/03/2008    WBC down from preop.  Cr better.  She is tolerating clear liquids.  Will advance to full liquid.  Not ready for discharge.   1 Day Post-Op    LOS: 1 day   Matt B. Hassell Done, MD, [facility] [phone] to reach the surgeon on call.    04/14/2021 8:12 AM

## 2021-04-14 NOTE — Progress Notes (Signed)
Discussed with patient about allergy listed in chart (buprenorphine) and patient states she "has never had a reaction to pain medication." Percocet ordered by MD.

## 2021-04-14 NOTE — Progress Notes (Signed)
°   04/14/21 1344  Mobility  Activity Refused mobility   Pt stated she is experiencing pain and does not want to mobilize.  Timoteo [employer] Mobility Specialist Acute Rehab Services Office: [phone]

## 2021-04-15 DIAGNOSIS — K449 Diaphragmatic hernia without obstruction or gangrene: Secondary | ICD-10-CM | POA: Diagnosis not present

## 2021-04-15 LAB — CBC WITH DIFFERENTIAL/PLATELET
Abs Immature Granulocytes: 0.03 10*3/uL (ref 0.00–0.07)
Basophils Absolute: 0 10*3/uL (ref 0.0–0.1)
Basophils Relative: 0 %
Eosinophils Absolute: 0.1 10*3/uL (ref 0.0–0.5)
Eosinophils Relative: 1 %
HCT: 44 % (ref 36.0–46.0)
Hemoglobin: 13.8 g/dL (ref 12.0–15.0)
Immature Granulocytes: 0 %
Lymphocytes Relative: 22 %
Lymphs Abs: 2 10*3/uL (ref 0.7–4.0)
MCH: 29 pg (ref 26.0–34.0)
MCHC: 31.4 g/dL (ref 30.0–36.0)
MCV: 92.4 fL (ref 80.0–100.0)
Monocytes Absolute: 0.7 10*3/uL (ref 0.1–1.0)
Monocytes Relative: 8 %
Neutro Abs: 6.4 10*3/uL (ref 1.7–7.7)
Neutrophils Relative %: 69 %
Platelets: 181 10*3/uL (ref 150–400)
RBC: 4.76 MIL/uL (ref 3.87–5.11)
RDW: 13.4 % (ref 11.5–15.5)
WBC: 9.2 10*3/uL (ref 4.0–10.5)
nRBC: 0 % (ref 0.0–0.2)

## 2021-04-15 MED ORDER — OXYCODONE-ACETAMINOPHEN 5-325 MG PO TABS
1.0000 | ORAL_TABLET | ORAL | Status: DC | PRN
  Administered 2021-04-15: 2 via ORAL
  Filled 2021-04-15: qty 2

## 2021-04-15 MED ORDER — OXYCODONE-ACETAMINOPHEN 5-325 MG PO TABS: 1.0000 | ORAL_TABLET | ORAL | 0 refills | Status: DC | PRN

## 2021-04-15 MED ORDER — ONDANSETRON HCL 4 MG PO TABS: 4.0000 mg | ORAL_TABLET | Freq: Every day | ORAL | 1 refills | Status: DC | PRN

## 2021-04-15 NOTE — Progress Notes (Signed)
Patient ID: Kathryn Olsen, female   DOB: [birth year], 71 y.o.   MRN: [id number] [facility] Surgery Progress Note:   2 Days Post-Op  Subjective: Mental status is clear.  Complaints sharp pains at site of asst port on the left and transfascial gastropexy suture. Objective: Vital signs in last 24 hours: Temp:  [98.4 F (36.9 C)-99.5 F (37.5 C)] 98.4 F (36.9 C) (02/02 0518) Pulse Rate:  [70-79] 71 (02/02 0518) Resp:  [16] 16 (02/02 0518) BP: (111-142)/(59-114) 131/114 (02/02 0518) SpO2:  [91 %-93 %] 91 % (02/02 0518)  Intake/Output from previous day: 02/01 0701 - 02/02 0700 In: 2682.1 [P.O.:1300; I.V.:1382.1] Out: 0  Intake/Output this shift: No intake/output data recorded.  Physical Exam: Work of breathing is not labored;  diastolic pressure high.  Incisions OK.    Lab Results:  Results for orders placed or performed during the hospital encounter of 04/13/21 (from the past 48 hour(s))  CBC     Status: Abnormal   Collection Time: 04/13/21  2:51 PM  Result Value Ref Range   WBC 20.6 (H) 4.0 - 10.5 K/uL   RBC 5.47 (H) 3.87 - 5.11 MIL/uL   Hemoglobin 15.9 (H) 12.0 - 15.0 g/dL   HCT 80.8 (H) 81.1 - 03.1 %   MCV 92.0 80.0 - 100.0 fL   MCH 29.1 26.0 - 34.0 pg   MCHC 31.6 30.0 - 36.0 g/dL   RDW 59.4 58.5 - 92.9 %   Platelets 300 150 - 400 K/uL   nRBC 0.0 0.0 - 0.2 %    Comment: Performed at [facility], [address], [place], [place] [address]  Creatinine, serum     Status: Abnormal   Collection Time: 04/13/21  3:41 PM  Result Value Ref Range   Creatinine, Ser 1.26 (H) 0.44 - 1.00 mg/dL   GFR, Estimated 45 (L) >60 mL/min    Comment: (NOTE) Calculated using the CKD-EPI Creatinine Equation (2021) Performed at  [facility], [address] [place], [place] [address]   Comprehensive metabolic panel     Status: Abnormal   Collection Time: 04/14/21  4:32 AM  Result Value Ref Range   Sodium 139 135 - 145 mmol/L   Potassium 4.4  3.5 - 5.1 mmol/L   Chloride 109 98 - 111 mmol/L   CO2 24 22 - 32 mmol/L   Glucose, Bld 144 (H) 70 - 99 mg/dL    Comment: Glucose reference range applies only to samples taken after fasting for at least 8 hours.   BUN 13 8 - 23 mg/dL   Creatinine, Ser 7.71 0.44 - 1.00 mg/dL   Calcium 8.9 8.9 - 16.5 mg/dL   Total Protein 6.4 (L) 6.5 - 8.1 g/dL   Albumin 3.6 3.5 - 5.0 g/dL   AST 82 (H) 15 - 41 U/L   ALT 60 (H) 0 - 44 U/L   Alkaline Phosphatase 62 38 - 126 U/L   Total Bilirubin 0.3 0.3 - 1.2 mg/dL   GFR, Estimated >79 >03 mL/min    Comment: (NOTE) Calculated using the CKD-EPI Creatinine Equation (2021)    Anion gap 6 5 - 15    Comment: Performed at [facility], [address] [place], [place] [address]  CBC     Status: Abnormal   Collection Time: 04/14/21  4:32 AM  Result Value Ref Range   WBC 11.5 (H) 4.0 - 10.5 K/uL   RBC 5.06 3.87 - 5.11 MIL/uL   Hemoglobin 14.6 12.0 -  15.0 g/dL   HCT 35.0 (H) 09.3 - 81.8 %   MCV 91.3 80.0 - 100.0 fL   MCH 28.9 26.0 - 34.0 pg   MCHC 31.6 30.0 - 36.0 g/dL   RDW 29.9 37.1 - 69.6 %   Platelets 221 150 - 400 K/uL   nRBC 0.0 0.0 - 0.2 %    Comment: Performed at [facility], [address] [place], [place] [address]    Radiology/Results: No results found.  Anti-infectives: Anti-infectives (From admission, onward)    Start     Dose/Rate Route Frequency Ordered Stop   04/13/21 2000  ceFAZolin (ANCEF) IVPB 2g/100 mL premix        2 g 200 mL/hr over 30 Minutes Intravenous Every 8 hours 04/13/21 1530 04/13/21 2017   04/13/21 1223  ceFAZolin (ANCEF) 2-4 GM/100ML-% IVPB       Note to Pharmacy: Yevonne Pax C: cabinet override      04/13/21 1223 04/14/21 0029   04/13/21 0600  ceFAZolin (ANCEF) IVPB 2g/100 mL premix        2 g 200 mL/hr over 30 Minutes Intravenous On call to O.R. 04/13/21 0533 04/13/21 1235       Assessment/Plan: Problem List: Patient Active Problem List   Diagnosis Date Noted    History of repair of hiatal hernia 04/13/2021   Snoring 11/18/2020   PSVT (paroxysmal supraventricular tachycardia) ([facility]) 12/19/2019   Sinus pause 12/19/2019   Pulmonary hypertension, unspecified ([facility]) 11/28/2019   Irregular heart beat 10/14/2019   Dizziness 10/14/2019   Asthma, chronic 11/29/2013   Other malaise and fatigue 11/29/2013   Shortness of breath 10/28/2013   Obesity, unspecified 10/28/2013   S/P left knee revision 09/23/2013   Exertional dyspnea 08/09/2013   Preoperative cardiovascular examination 08/09/2013   Pre-operative cardiovascular examination 08/09/2013   Chest pain 08/09/2013   Dyspnea 08/09/2013   Essential hypertension 08/09/2013   Nonspecific (abnormal) findings on radiological and other examination of gastrointestinal tract 04/26/2011   Esophageal reflux 04/26/2011   Chest pain, unspecified 04/26/2011   Special screening for malignant neoplasms, colon 04/26/2011   DYSLIPIDEMIA 09/03/2008   Malignant hypertension 09/03/2008   DEGENERATIVE DISC DISEASE 09/03/2008    No able to mobilize and not ready for discharge.  She may need more aggressive use of oral pain meds-she indicated that she was trying to minimize pain meds.  Will check CBC this AM and reassess for discharge this pm.   2 Days Post-Op    LOS: 2 days   Matt B. Daphine Deutscher, MD, [facility] [phone] to reach the surgeon on call.    04/15/2021 8:02 AM

## 2021-04-15 NOTE — Care Management Obs Status (Signed)
MEDICARE OBSERVATION STATUS NOTIFICATION   Patient Details  Name: Kathryn Olsen MRN: [id number] Date of Birth: Apr 26, 1950   Medicare Observation Status Notification Given:  Yes    Amada Jupiter, LCSW 04/15/2021, 1:32 PM

## 2021-04-15 NOTE — Plan of Care (Signed)

## 2021-04-15 NOTE — Discharge Summary (Signed)
Physician Discharge Summary  Patient ID: Kathryn Olsen MRN: [id number] DOB/AGE: Jul 27, 1950 71 y.o.  PCP: Iona Hansen, NP  Admit date: 04/13/2021 Discharge date: 04/15/2021  Admission Diagnoses:  all of stomach in the chest-large type III hiatal hernia  Discharge Diagnoses:  same, post reduction of hernia and fundoplication partial  Principal Problem:   History of repair of hiatal hernia   Surgery:  robotic takedown and repair of hiatal hernia with partial fundopication  Discharged Condition: improved  Hospital Course:   Patient had surgery on Tuesday.  She was begun on clears.  She was not ready to go home on Wednesday and had some breakthrough pain.  Labs normalized.  PO intake improved and she was able to go home the afternoon of the second PD.  Consults: none  Significant Diagnostic Studies: none    Discharge Exam: Blood pressure 118/83, pulse (!) 53, temperature 99.3 F (37.4 C), temperature source Oral, resp. rate 14, weight 109.3 kg, SpO2 94 %. Incisions bland  Disposition: Discharge disposition: 01-Home or Self Care       Discharge Instructions     Call MD for:  redness, tenderness, or signs of infection (pain, swelling, redness, odor or green/yellow discharge around incision site)   Complete by: As directed    Diet - low sodium heart healthy   Complete by: As directed    Discharge instructions   Complete by: As directed    Full liquid diet for one week--then pureed food for 3 weeks   Increase activity slowly   Complete by: As directed       Allergies as of 04/15/2021       Reactions   Buprenorphine Hcl Anaphylaxis, Itching   Ace Inhibitors Diarrhea        Medication List     TAKE these medications    albuterol 108 (90 Base) MCG/ACT inhaler Commonly known as: VENTOLIN HFA Inhale 1-2 puffs into the lungs every 6 (six) hours as needed for wheezing or shortness of breath.   amLODipine 5 MG tablet Commonly known as: NORVASC Take 5 mg  by mouth every evening.   aspirin EC 81 MG tablet Take 81 mg by mouth daily. Swallow whole.   atorvastatin 10 MG tablet Commonly known as: LIPITOR Take 10 mg by mouth daily.   budesonide-formoterol 80-4.5 MCG/ACT inhaler Commonly known as: Symbicort Inhale 2 puffs into the lungs 2 (two) times daily.   cholecalciferol 25 MCG (1000 UNIT) tablet Commonly known as: VITAMIN D3 Take 1,000 Units by mouth daily.   diltiazem 30 MG tablet Commonly known as: CARDIZEM TAKE 1 TABLET(30 MG) BY MOUTH EVERY 8 HOURS AS NEEDED What changed: See the new instructions.   furosemide 20 MG tablet Commonly known as: LASIX Take 20 mg by mouth daily.   ibuprofen 800 MG tablet Commonly known as: ADVIL Take 1 tablet (800 mg total) by mouth 3 (three) times daily.   losartan-hydrochlorothiazide 100-25 MG tablet Commonly known as: HYZAAR TAKE 1 TABLET EVERY DAY   magnesium oxide 400 MG tablet Commonly known as: MAG-OX Take 400 mg by mouth daily.   meloxicam 15 MG tablet Commonly known as: MOBIC Take 15 mg by mouth at bedtime.   multivitamin with minerals Tabs tablet Take 1 tablet by mouth daily.   omeprazole 20 MG capsule Commonly known as: PRILOSEC Take 20 mg by mouth daily.   ondansetron 4 MG tablet Commonly known as: Zofran Take 1 tablet (4 mg total) by mouth daily as needed for nausea or vomiting.  oxyCODONE-acetaminophen 5-325 MG tablet Commonly known as: PERCOCET/ROXICET Take 1-2 tablets by mouth every 4 (four) hours as needed for moderate pain.   Potassium Gluconate 550 MG Tabs Take 550 mg by mouth daily.   triamcinolone 55 MCG/ACT Aero nasal inhaler Commonly known as: NASACORT Place 1 spray into the nose daily as needed (allergies).   zinc gluconate 50 MG tablet Take 50 mg by mouth daily.        Follow-up Information     Luretha Murphy, MD Follow up in 3 week(s).   Specialty: General Surgery Why: For routine follow up with Dr. Bennett Scrape information: [address] [place] [address] [phone]                 Signed: Valarie Merino 04/15/2021, 4:51 PM

## 2021-04-15 NOTE — Progress Notes (Signed)
Assessment unchanged. Pain well controlled w/ ordered pain meds. Pt and husband verbalized understanding of dc instructions through teach back. Discharged via wc to front entrance accompanied by NT and husband.

## 2021-04-15 NOTE — Care Management CC44 (Signed)
Condition Code 44 Documentation Completed  Patient Details  Name: Kathryn Olsen MRN: [id number] Date of Birth: 1950/09/19   Condition Code 44 given:  Yes Patient signature on Condition Code 44 notice:  Yes Documentation of 2 MD's agreement:  Yes Code 44 added to claim:  Yes    Jaline Pincock, LCSW 04/15/2021, 1:32 PM

## 2021-04-21 ENCOUNTER — Other Ambulatory Visit: Payer: Self-pay

## 2021-04-21 ENCOUNTER — Encounter (HOSPITAL_COMMUNITY): Payer: Self-pay | Admitting: *Deleted

## 2021-04-21 ENCOUNTER — Emergency Department (HOSPITAL_COMMUNITY)
Admission: EM | Admit: 2021-04-21 | Discharge: 2021-04-22 | Disposition: A | Discharge status: Home / Self Care | Payer: Medicare HMO | Attending: Emergency Medicine | Admitting: Emergency Medicine

## 2021-04-21 DIAGNOSIS — J449 Chronic obstructive pulmonary disease, unspecified: Secondary | ICD-10-CM | POA: Diagnosis not present

## 2021-04-21 DIAGNOSIS — K5900 Constipation, unspecified: Secondary | ICD-10-CM | POA: Diagnosis not present

## 2021-04-21 DIAGNOSIS — Z7722 Contact with and (suspected) exposure to environmental tobacco smoke (acute) (chronic): Secondary | ICD-10-CM | POA: Insufficient documentation

## 2021-04-21 DIAGNOSIS — Z79899 Other long term (current) drug therapy: Secondary | ICD-10-CM | POA: Diagnosis not present

## 2021-04-21 DIAGNOSIS — Z7982 Long term (current) use of aspirin: Secondary | ICD-10-CM | POA: Diagnosis not present

## 2021-04-21 DIAGNOSIS — J45909 Unspecified asthma, uncomplicated: Secondary | ICD-10-CM | POA: Diagnosis not present

## 2021-04-21 DIAGNOSIS — I10 Essential (primary) hypertension: Secondary | ICD-10-CM | POA: Insufficient documentation

## 2021-04-21 LAB — CBC WITH DIFFERENTIAL/PLATELET
Abs Immature Granulocytes: 0.03 10*3/uL (ref 0.00–0.07)
Basophils Absolute: 0.1 10*3/uL (ref 0.0–0.1)
Basophils Relative: 1 %
Eosinophils Absolute: 0.3 10*3/uL (ref 0.0–0.5)
Eosinophils Relative: 3 %
HCT: 47.2 % — ABNORMAL HIGH (ref 36.0–46.0)
Hemoglobin: 15.4 g/dL — ABNORMAL HIGH (ref 12.0–15.0)
Immature Granulocytes: 0 %
Lymphocytes Relative: 21 %
Lymphs Abs: 2.1 10*3/uL (ref 0.7–4.0)
MCH: 29.6 pg (ref 26.0–34.0)
MCHC: 32.6 g/dL (ref 30.0–36.0)
MCV: 90.8 fL (ref 80.0–100.0)
Monocytes Absolute: 0.9 10*3/uL (ref 0.1–1.0)
Monocytes Relative: 9 %
Neutro Abs: 6.6 10*3/uL (ref 1.7–7.7)
Neutrophils Relative %: 66 %
Platelets: 282 10*3/uL (ref 150–400)
RBC: 5.2 MIL/uL — ABNORMAL HIGH (ref 3.87–5.11)
RDW: 12.7 % (ref 11.5–15.5)
WBC: 9.9 10*3/uL (ref 4.0–10.5)
nRBC: 0 % (ref 0.0–0.2)

## 2021-04-21 LAB — COMPREHENSIVE METABOLIC PANEL
ALT: 28 U/L (ref 0–44)
AST: 21 U/L (ref 15–41)
Albumin: 3.8 g/dL (ref 3.5–5.0)
Alkaline Phosphatase: 74 U/L (ref 38–126)
Anion gap: 11 (ref 5–15)
BUN: 10 mg/dL (ref 8–23)
CO2: 27 mmol/L (ref 22–32)
Calcium: 9 mg/dL (ref 8.9–10.3)
Chloride: 100 mmol/L (ref 98–111)
Creatinine, Ser: 0.69 mg/dL (ref 0.44–1.00)
GFR, Estimated: >60 mL/min (ref >60)
Glucose, Bld: 109 mg/dL — ABNORMAL HIGH (ref 70–99)
Potassium: 3.9 mmol/L (ref 3.5–5.1)
Sodium: 138 mmol/L (ref 135–145)
Total Bilirubin: 1.1 mg/dL (ref 0.3–1.2)
Total Protein: 6.5 g/dL (ref 6.5–8.1)

## 2021-04-21 LAB — MAGNESIUM: Magnesium: 2.1 mg/dL (ref 1.7–2.4)

## 2021-04-21 NOTE — ED Provider Notes (Signed)
Emergency Department Provider Note  I have reviewed the triage vital signs and the nursing notes.  HISTORY  Chief Complaint Constipation   HPI Kathryn Olsen is a 71 y.o. female with recent surgery for hiatal hernia that presents emerged from today with constipation.  Patient states that she had a bowel movements in the hospital and then pretty regularly but then on Saturday she started to feel backed up.  Then Sunday morning is less than she had a bowel movement.  Last night she passed gas was 36 hours ago.  She has worsening worsening distention.  She tried prune juice and, prune juice with butter, warm prune juice and whole bottle of MiraLAX none of itself.  No pain medication since Saturday.  No fevers.  She has not had any vomiting or nausea but does have a decreased appetite. Still on a liquid diet.   PMH Past Medical History:  Diagnosis Date   Asthma    COPD (chronic obstructive pulmonary disease) (HCC)    questionable   Degeneration of intervertebral disc, site unspecified    Depression    DJD (degenerative joint disease)    Dyslipidemia    Dysrhythmia    GERD (gastroesophageal reflux disease)    Pneumonia 04/14/2013   Shortness of breath    on exertion   Sleep apnea    stopbang=4   Unspecified essential hypertension     Home Medications Prior to Admission medications   Medication Sig Start Date End Date Taking? Authorizing Provider  albuterol (PROVENTIL HFA;VENTOLIN HFA) 108 (90 Base) MCG/ACT inhaler Inhale 1-2 puffs into the lungs every 6 (six) hours as needed for wheezing or shortness of breath. 11/05/15   Lupita Leash, MD  amLODipine (NORVASC) 5 MG tablet Take 5 mg by mouth every evening. 12/02/19   [provider]  aspirin EC 81 MG tablet Take 81 mg by mouth daily. Swallow whole.    [provider]  atorvastatin (LIPITOR) 10 MG tablet Take 10 mg by mouth daily. 11/28/17   [provider]  budesonide-formoterol (SYMBICORT)  80-4.5 MCG/ACT inhaler Inhale 2 puffs into the lungs 2 (two) times daily. 11/05/15   Lupita Leash, MD  cholecalciferol (VITAMIN D3) 25 MCG (1000 UNIT) tablet Take 1,000 Units by mouth daily.    [provider]  diltiazem (CARDIZEM) 30 MG tablet TAKE 1 TABLET(30 MG) BY MOUTH EVERY 8 HOURS AS NEEDED Patient taking differently: Take 30 mg by mouth 3 (three) times daily as needed. Blood pressure 01/15/20   Patwardhan, Manish J, MD  furosemide (LASIX) 20 MG tablet Take 20 mg by mouth daily.  12/02/19   [provider]  ibuprofen (ADVIL) 800 MG tablet Take 1 tablet (800 mg total) by mouth 3 (three) times daily. Patient not taking: Reported on 04/02/2021 03/23/20   Durward Parcel, FNP  losartan-hydrochlorothiazide (HYZAAR) 100-25 MG tablet TAKE 1 TABLET EVERY DAY 03/22/21   Patwardhan, Manish J, MD  magnesium oxide (MAG-OX) 400 MG tablet Take 400 mg by mouth daily.    [provider]  meloxicam (MOBIC) 15 MG tablet Take 15 mg by mouth at bedtime.     [provider]  Multiple Vitamin (MULTIVITAMIN WITH MINERALS) TABS tablet Take 1 tablet by mouth daily.    [provider]  omeprazole (PRILOSEC) 20 MG capsule Take 20 mg by mouth daily.    [provider]  ondansetron (ZOFRAN) 4 MG tablet Take 1 tablet (4 mg total) by mouth daily as needed for nausea or  vomiting. 04/15/21 04/15/22  Luretha MurphyMartin, Matthew, MD  oxyCODONE-acetaminophen (PERCOCET/ROXICET) 5-325 MG tablet Take 1-2 tablets by mouth every 4 (four) hours as needed for moderate pain. 04/15/21   Luretha MurphyMartin, Matthew, MD  Potassium Gluconate 550 MG TABS Take 550 mg by mouth daily.    [provider]  triamcinolone (NASACORT) 55 MCG/ACT AERO nasal inhaler Place 1 spray into the nose daily as needed (allergies).    [provider]  zinc gluconate 50 MG tablet Take 50 mg by mouth daily.    [provider]    Social History Social History   Tobacco Use   Smoking status: Never     Passive exposure: Yes   Smokeless tobacco: Never   Tobacco comments:    both parents smoked  Vaping Use   Vaping Use: Never used  Substance Use Topics   Alcohol use: Yes    Alcohol/week: 1.0 standard drink    Types: 1 Glasses of wine per week    Comment: occasional glass of wine   Drug use: No    Review of Systems: Documented in HPI ____________________________________________  PHYSICAL EXAM: VITAL SIGNS: ED Triage Vitals  Enc Vitals Group     BP 04/21/21 2048 (!) 139/111     Pulse Rate 04/21/21 2048 95     Resp 04/21/21 2048 17     Temp 04/21/21 2048 98.2 F (36.8 C)     Temp Source 04/21/21 2048 Oral     SpO2 04/21/21 2048 96 %     Weight 04/21/21 2049 228 lb (103.4 kg)     Height 04/21/21 2049 5\' 6"  (1.676 m)   Physical Exam Vitals and nursing note reviewed.  Constitutional:      Appearance: She is well-developed.  HENT:     Head: Normocephalic and atraumatic.     Mouth/Throat:     Mouth: Mucous membranes are moist.     Pharynx: Oropharynx is clear.  Cardiovascular:     Rate and Rhythm: Normal rate and regular rhythm.  Pulmonary:     Effort: No respiratory distress.     Breath sounds: No stridor.  Abdominal:     General: Abdomen is flat. There is no distension.     Tenderness: There is no abdominal tenderness.  Musculoskeletal:        General: No swelling or tenderness. Normal range of motion.     Cervical back: Normal range of motion.  Skin:    General: Skin is warm and dry.     Comments: Well healing incision sites on abdomen.   Neurological:     General: No focal deficit present.     Mental Status: She is alert.      ____________________________________________   LABS (all labs ordered are listed, but only abnormal results are displayed)  Labs Reviewed  CBC WITH DIFFERENTIAL/PLATELET - Abnormal; Notable for the following components:      Result Value   RBC 5.20 (*)    Hemoglobin 15.4 (*)    HCT 47.2 (*)    All other components within normal  limits  COMPREHENSIVE METABOLIC PANEL - Abnormal; Notable for the following components:   Glucose, Bld 109 (*)    All other components within normal limits  MAGNESIUM   ____________________________________________  EKG   ____________________________________________  RADIOLOGY  CT ABDOMEN PELVIS W CONTRAST  Result Date: 04/22/2021 CLINICAL DATA:  Postoperative abdominal pain. Constipation. Day 8 from hernia surgery. EXAM: CT ABDOMEN AND PELVIS WITH CONTRAST TECHNIQUE: Multidetector CT imaging of the abdomen and  pelvis was performed using the standard protocol following bolus administration of intravenous contrast. RADIATION DOSE REDUCTION: This exam was performed according to the departmental dose-optimization program which includes automated exposure control, adjustment of the mA and/or kV according to patient size and/or use of iterative reconstruction technique. CONTRAST:  OMNIPAQUE IOHEXOL 300 MG/ML  SOLN COMPARISON:  CT abdomen 12/10/2019 FINDINGS: Lower chest: Lung bases are clear. Interval fundoplication with treatment of the previous hiatal hernia. Fluid in the para esophageal tissues is likely postoperative. Hepatobiliary: No focal liver abnormality is seen. No gallstones, gallbladder wall thickening, or biliary dilatation. Pancreas: Unremarkable. No pancreatic ductal dilatation or surrounding inflammatory changes. Spleen: Normal in size without focal abnormality. Adrenals/Urinary Tract: Adrenal glands are unremarkable. Kidneys are normal, without renal calculi, focal lesion, or hydronephrosis. Bladder is unremarkable. Stomach/Bowel: Stomach, small bowel, and colon are not abnormally distended. Duodenal diverticulum. Colon is diffusely stool-filled. No wall thickening or inflammatory changes appreciated. Appendix is not identified. Vascular/Lymphatic: Aortic atherosclerosis. No enlarged abdominal or pelvic lymph nodes. Reproductive: Uterus and ovaries are not enlarged. Calcifications in  the uterus likely represent fibroids. Other: No free air or free fluid in the abdomen. Abdominal wall musculature appears intact. Mild subcutaneous edema in the left upper quadrant chest wall likely is postoperative. Musculoskeletal: Degenerative changes in the spine. IMPRESSION: 1. Interval fundoplication for treatment of previous hiatal hernia. Fluid in the paraesophageal space is nonspecific but likely represents a postoperative collection. 2. No evidence of bowel obstruction or inflammation. Stool-filled colon consistent with history of constipation. 3. Calcified uterine fibroids. Electronically Signed   By: Burman Nieves M.D.   On: 04/22/2021 01:50   ____________________________________________  PROCEDURES  Procedure(s) performed:   Procedures ____________________________________________  INITIAL IMPRESSION / ASSESSMENT AND PLAN   This patient presents to the ED for concern of constipation, this involves an extensive number of treatment options, and is a complaint that carries with it a high risk of complications and morbidity.  The differential diagnosis includes hypokalemia, hypomagnesemis, dehydration, bowel obstruction, ileus, rectal impaction.  Will start with labs and then decide on CT scan.  Additional history obtained:  Additional history obtained from noone Previous records obtained and reviewed in epic  Co morbidities that complicate the patient evaluation  N/a  Social Determinants of Health:  N/a  ED Course  Images ordered viewed and obtained by myself. Agree with Radiology interpretation. Details in ED course.  Labs ordered reviewed by myself as detailed in ED course.  Consultations obtained/considered detailed in ED course.        Cardiac Monitoring:  The patient was maintained on a cardiac monitor.  I personally viewed and interpreted the cardiac monitored which showed an underlying rhythm of: sinus rhythm  CRITICAL  INTERVENTIONS:  N/a  Reevaluation:  After the interventions noted above, I reevaluated the patient and found that they have :stayed the same  CT scan negative for any acute abnormalities.  No evidence of impaction, bowel obstruction or any obstructing masses.  There is small fluid collection but likely postoperative especially without any fevers focal pain or other evidence of infection.  I offered her an enema here versus bowel cleanout. She stated she was starting to feel some gas and digestion like she might be getting ready to have a BM and declined.  She decided that she would like to try those things at home and will continue to follow with her surgeon.   FINAL IMPRESSION AND PLAN Final diagnoses:  Constipation, unspecified constipation type    A medical  screening exam was performed and I feel the patient has had an appropriate workup for their chief complaint at this time and likelihood of emergent condition existing is low. They have been counseled on decision, DISCHARGE, follow up and which symptoms necessitate immediate return to the emergency department. They or their family verbally stated understanding and agreement with plan and discharged in stable condition.   ____________________________________________   NEW OUTPATIENT MEDICATIONS STARTED DURING THIS VISIT:  Discharge Medication List as of 04/22/2021  6:31 AM      Note:  This note was prepared with assistance of Dragon voice recognition software. Occasional wrong-word or sound-a-like substitutions may have occurred due to the inherent limitations of voice recognition software.    Stefon Ramthun, Barbara Cower, MD 04/22/21 [phone]

## 2021-04-21 NOTE — ED Triage Notes (Signed)
Pt c/o constipation.  LBM was Sunday morning.  Pt has tried laxatives, warm prune juice with butter without relief.  Pt is post op day 8 from hernia surgery.

## 2021-04-22 ENCOUNTER — Emergency Department (HOSPITAL_COMMUNITY): Payer: Medicare HMO

## 2021-04-22 MED ORDER — IOHEXOL 300 MG/ML  SOLN
100.0000 mL | Freq: Once | INTRAMUSCULAR | Status: AC | PRN
  Administered 2021-04-22: 100 mL via INTRAVENOUS

## 2021-05-20 ENCOUNTER — Telehealth: Payer: Self-pay | Admitting: Pulmonary Disease

## 2021-05-20 NOTE — Telephone Encounter (Signed)
Called and left detailed message on pts home phone vm (dpr) and let her know form was ready for pickup. Also notified patient it is fro 6 months and will be re evaluated after PFTs in April per RA. Nothing further needed. Form placed up front with Tonya ?

## 2021-05-20 NOTE — Telephone Encounter (Signed)
Remainder of form filled out and placed on Dr. Carlena Sax desk for him to sign.  ?

## 2021-07-20 ENCOUNTER — Emergency Department (HOSPITAL_COMMUNITY): Payer: Medicare HMO

## 2021-07-20 ENCOUNTER — Emergency Department (HOSPITAL_BASED_OUTPATIENT_CLINIC_OR_DEPARTMENT_OTHER): Payer: Medicare HMO

## 2021-07-20 ENCOUNTER — Emergency Department (HOSPITAL_COMMUNITY)
Admission: EM | Admit: 2021-07-20 | Discharge: 2021-07-20 | Disposition: A | Discharge status: Home / Self Care | Payer: Medicare HMO | Attending: Emergency Medicine | Admitting: Emergency Medicine

## 2021-07-20 ENCOUNTER — Encounter (HOSPITAL_COMMUNITY): Payer: Self-pay | Admitting: Emergency Medicine

## 2021-07-20 DIAGNOSIS — I10 Essential (primary) hypertension: Secondary | ICD-10-CM | POA: Diagnosis not present

## 2021-07-20 DIAGNOSIS — Z7951 Long term (current) use of inhaled steroids: Secondary | ICD-10-CM | POA: Insufficient documentation

## 2021-07-20 DIAGNOSIS — R0602 Shortness of breath: Secondary | ICD-10-CM | POA: Insufficient documentation

## 2021-07-20 DIAGNOSIS — E876 Hypokalemia: Secondary | ICD-10-CM | POA: Insufficient documentation

## 2021-07-20 DIAGNOSIS — Z79899 Other long term (current) drug therapy: Secondary | ICD-10-CM | POA: Diagnosis not present

## 2021-07-20 DIAGNOSIS — R600 Localized edema: Secondary | ICD-10-CM | POA: Insufficient documentation

## 2021-07-20 DIAGNOSIS — I4891 Unspecified atrial fibrillation: Secondary | ICD-10-CM | POA: Diagnosis not present

## 2021-07-20 DIAGNOSIS — J45909 Unspecified asthma, uncomplicated: Secondary | ICD-10-CM | POA: Diagnosis not present

## 2021-07-20 DIAGNOSIS — M79662 Pain in left lower leg: Secondary | ICD-10-CM | POA: Insufficient documentation

## 2021-07-20 DIAGNOSIS — D582 Other hemoglobinopathies: Secondary | ICD-10-CM | POA: Diagnosis not present

## 2021-07-20 DIAGNOSIS — Z7982 Long term (current) use of aspirin: Secondary | ICD-10-CM | POA: Diagnosis not present

## 2021-07-20 DIAGNOSIS — M7989 Other specified soft tissue disorders: Secondary | ICD-10-CM | POA: Diagnosis not present

## 2021-07-20 DIAGNOSIS — J449 Chronic obstructive pulmonary disease, unspecified: Secondary | ICD-10-CM | POA: Insufficient documentation

## 2021-07-20 DIAGNOSIS — R079 Chest pain, unspecified: Secondary | ICD-10-CM | POA: Diagnosis present

## 2021-07-20 LAB — CBC
HCT: 50.4 % — ABNORMAL HIGH (ref 36.0–46.0)
Hemoglobin: 16.9 g/dL — ABNORMAL HIGH (ref 12.0–15.0)
MCH: 29.9 pg (ref 26.0–34.0)
MCHC: 33.5 g/dL (ref 30.0–36.0)
MCV: 89 fL (ref 80.0–100.0)
Platelets: 255 10*3/uL (ref 150–400)
RBC: 5.66 MIL/uL — ABNORMAL HIGH (ref 3.87–5.11)
RDW: 13.4 % (ref 11.5–15.5)
WBC: 9.2 10*3/uL (ref 4.0–10.5)
nRBC: 0 % (ref 0.0–0.2)

## 2021-07-20 LAB — BASIC METABOLIC PANEL
Anion gap: 10 (ref 5–15)
BUN: 12 mg/dL (ref 8–23)
CO2: 25 mmol/L (ref 22–32)
Calcium: 9.8 mg/dL (ref 8.9–10.3)
Chloride: 107 mmol/L (ref 98–111)
Creatinine, Ser: 0.81 mg/dL (ref 0.44–1.00)
GFR, Estimated: >60 mL/min (ref >60)
Glucose, Bld: 106 mg/dL — ABNORMAL HIGH (ref 70–99)
Potassium: 3 mmol/L — ABNORMAL LOW (ref 3.5–5.1)
Sodium: 142 mmol/L (ref 135–145)

## 2021-07-20 LAB — TROPONIN I (HIGH SENSITIVITY)
Troponin I (High Sensitivity): 5 ng/L (ref <18)
Troponin I (High Sensitivity): 6 ng/L (ref <18)

## 2021-07-20 LAB — D-DIMER, QUANTITATIVE: D-Dimer, Quant: 0.43 ug/mL-FEU (ref 0.00–0.50)

## 2021-07-20 MED ORDER — APIXABAN 5 MG PO TABS
5.0000 mg | ORAL_TABLET | Freq: Two times a day (BID) | ORAL | Status: DC
Start: 2021-07-20 — End: 2021-07-21
  Administered 2021-07-20: 5 mg via ORAL
  Filled 2021-07-20: qty 1

## 2021-07-20 MED ORDER — APIXABAN 5 MG PO TABS: 5.0000 mg | ORAL_TABLET | Freq: Two times a day (BID) | ORAL | 0 refills | Status: DC

## 2021-07-20 MED ORDER — POTASSIUM CHLORIDE CRYS ER 20 MEQ PO TBCR
40.0000 meq | EXTENDED_RELEASE_TABLET | Freq: Once | ORAL | Status: AC
  Administered 2021-07-20: 40 meq via ORAL
  Filled 2021-07-20: qty 2

## 2021-07-20 MED ORDER — DILTIAZEM HCL 30 MG PO TABS
30.0000 mg | ORAL_TABLET | Freq: Once | ORAL | Status: AC
  Administered 2021-07-20: 30 mg via ORAL
  Filled 2021-07-20: qty 1

## 2021-07-20 NOTE — ED Provider Notes (Signed)
?  Patient's care assumed by myself. Previously seen by PA Fritz Pickerel. ? ?71 yo female presenting for new onset Afib with a RVR of 110. Cardizem given. Rate stable 70-80 currently. [place] for leg swelling demonstrates no DVT.  ? ?Plan:  ?Trops to DC ?5 mg eliquis tonight ?eliquis prescription  ?F/u with cards Patwardhan ? ?Physical Exam  ?BP 123/89   Pulse 71   Temp 98.2 ?F (36.8 ?C) (Oral)   Resp 16   Ht 5\' 5"  (1.651 m)   Wt 102.1 kg   SpO2 98%   BMI 37.44 kg/m?  ? ?Physical Exam ?Vitals and nursing note reviewed.  ?Constitutional:   ?   General: She is not in acute distress. ?   Appearance: She is well-developed.  ?HENT:  ?   Head: Normocephalic and atraumatic.  ?Eyes:  ?   Conjunctiva/sclera: Conjunctivae normal.  ?Cardiovascular:  ?   Rate and Rhythm: Normal rate. Rhythm irregular.  ?   Heart sounds: No murmur heard. ?Pulmonary:  ?   Effort: Pulmonary effort is normal. No respiratory distress.  ?   Breath sounds: Normal breath sounds.  ?Abdominal:  ?   Palpations: Abdomen is soft.  ?   Tenderness: There is no abdominal tenderness.  ?Musculoskeletal:     ?   General: No swelling.  ?   Cervical back: Neck supple.  ?Skin: ?   General: Skin is warm and dry.  ?   Capillary Refill: Capillary refill takes less than 2 seconds.  ?Neurological:  ?   Mental Status: She is alert.  ?Psychiatric:     ?   Mood and Affect: Mood normal.  ? ? ?Procedures  ?Procedures ? ?ED Course / MDM  ?  ?Medical Decision Making ?Amount and/or Complexity of Data Reviewed ?Labs: ordered. ?Radiology: ordered. ? ?Risk ?Prescription drug management. ? ? ?EKG demonstrating A-fib.  Stable rate at this time.  Eliquis given in ED and sent to pharmacy.  Troponins within normal limits.  Patient recommended for close follow-up with cardiologist. ? ?Patient in no distress and overall condition improved here in the ED. Detailed discussions were had with the patient regarding current findings, and need for close f/u with PCP or on call doctor. The patient has  been instructed to return immediately if the symptoms worsen in any way for re-evaluation. Patient verbalized understanding and is in agreement with current care plan. All questions answered prior to discharge. ? ? ? ? ? ?  ?Lianne Cure, DO ?[id number] ? ?

## 2021-07-20 NOTE — ED Provider Notes (Signed)
?[facility] ?Provider Note ? ? ?CSN: [id number] ?Arrival date & time: 07/20/21  1521 ? ?  ? ?History ? ?Chief Complaint  ?Patient presents with  ? Atrial Fibrillation  ? ? ?Kathryn Olsen is a 71 y.o. female.  Patient presents to the hospital with chief complaint of new onset atrial fibrillation.  Patient went to her primary care earlier today for a regular checkup.  At that visit she complained of some left-sided chest pain and EKG showed atrial fibrillation.  Patient has no history of the same.  Patient also complains of some left lower leg pain.  She recently traveled to [place] and was on an airplane for approximately 4 hours.  Patient endorses mild shortness of breath when she had the chest pain earlier today.  Denies nausea, vomiting, abdominal pain.  Past medical history significant for COPD, GERD, dyslipidemia, hypertension, dysrhythmia, asthma ? ?HPI ? ?  ? ?Home Medications ?Prior to Admission medications   ?Medication Sig Start Date End Date Taking? Authorizing Provider  ?albuterol (PROVENTIL HFA;VENTOLIN HFA) 108 (90 Base) MCG/ACT inhaler Inhale 1-2 puffs into the lungs every 6 (six) hours as needed for wheezing or shortness of breath. 11/05/15   Juanito Doom, MD  ?amLODipine (NORVASC) 5 MG tablet Take 5 mg by mouth every evening. 12/02/19   [provider]  ?aspirin EC 81 MG tablet Take 81 mg by mouth daily. Swallow whole.    [provider]  ?atorvastatin (LIPITOR) 10 MG tablet Take 10 mg by mouth daily. 11/28/17   [provider]  ?budesonide-formoterol (SYMBICORT) 80-4.5 MCG/ACT inhaler Inhale 2 puffs into the lungs 2 (two) times daily. 11/05/15   Juanito Doom, MD  ?cholecalciferol (VITAMIN D3) 25 MCG (1000 UNIT) tablet Take 1,000 Units by mouth daily.    [provider]  ?diltiazem (CARDIZEM) 30 MG tablet TAKE 1 TABLET(30 MG) BY MOUTH EVERY 8 HOURS AS NEEDED ?Patient taking differently: Take 30 mg by mouth 3  (three) times daily as needed. Blood pressure 01/15/20   Patwardhan, Manish J, MD  ?furosemide (LASIX) 20 MG tablet Take 20 mg by mouth daily.  12/02/19   [provider]  ?ibuprofen (ADVIL) 800 MG tablet Take 1 tablet (800 mg total) by mouth 3 (three) times daily. ?Patient not taking: Reported on 04/02/2021 03/23/20   Emerson Monte, FNP  ?losartan-hydrochlorothiazide (HYZAAR) 100-25 MG tablet TAKE 1 TABLET EVERY DAY 03/22/21   Patwardhan, Manish J, MD  ?magnesium oxide (MAG-OX) 400 MG tablet Take 400 mg by mouth daily.    [provider]  ?meloxicam (MOBIC) 15 MG tablet Take 15 mg by mouth at bedtime.     [provider]  ?Multiple Vitamin (MULTIVITAMIN WITH MINERALS) TABS tablet Take 1 tablet by mouth daily.    [provider]  ?omeprazole (PRILOSEC) 20 MG capsule Take 20 mg by mouth daily.    [provider]  ?ondansetron (ZOFRAN) 4 MG tablet Take 1 tablet (4 mg total) by mouth daily as needed for nausea or vomiting. 04/15/21 04/15/22  Johnathan Hausen, MD  ?oxyCODONE-acetaminophen (PERCOCET/ROXICET) 5-325 MG tablet Take 1-2 tablets by mouth every 4 (four) hours as needed for moderate pain. 04/15/21   Johnathan Hausen, MD  ?Potassium Gluconate 550 MG TABS Take 550 mg by mouth daily.    [provider]  ?triamcinolone (NASACORT) 55 MCG/ACT AERO nasal inhaler Place 1 spray into the nose daily as needed (allergies).    [provider]  ?zinc gluconate 50 MG tablet  Take 50 mg by mouth daily.    [provider]  ?   ? ?Allergies    ?Buprenorphine hcl and Ace inhibitors   ? ?Review of Systems   ?Review of Systems  ?Constitutional:  Negative for fever.  ?Respiratory:  Positive for shortness of breath. Negative for cough.   ?Cardiovascular:  Positive for chest pain, palpitations and leg swelling.  ?Gastrointestinal:  Negative for abdominal pain and nausea.  ?Musculoskeletal:  Positive for neck pain.  ? ?Physical Exam ?Updated Vital Signs ?BP 123/89   Pulse  71   Temp 98.2 ?F (36.8 ?C) (Oral)   Resp 16   Ht 5\' 5"  (1.651 m)   Wt 102.1 kg   SpO2 98%   BMI 37.44 kg/m?  ?Physical Exam ?Vitals and nursing note reviewed.  ?Constitutional:   ?   General: She is not in acute distress. ?HENT:  ?   Head: Normocephalic.  ?Eyes:  ?   Conjunctiva/sclera: Conjunctivae normal.  ?Cardiovascular:  ?   Rate and Rhythm: Normal rate. Rhythm irregular.  ?   Pulses: Normal pulses.  ?   Heart sounds: Normal heart sounds.  ?Pulmonary:  ?   Effort: Pulmonary effort is normal.  ?   Breath sounds: Normal breath sounds.  ?Abdominal:  ?   Palpations: Abdomen is soft.  ?Musculoskeletal:     ?   General: Tenderness present.  ?   Cervical back: Normal range of motion and neck supple.  ?   Right lower leg: Edema present.  ?   Left lower leg: Edema present.  ?   Comments: LLE tenderness  ?Neurological:  ?   Mental Status: She is alert and oriented to person, place, and time.  ? ? ?ED Results / Procedures / Treatments   ?Labs ?(all labs ordered are listed, but only abnormal results are displayed) ?Labs Reviewed  ?BASIC METABOLIC PANEL - Abnormal; Notable for the following components:  ?    Result Value  ? Potassium 3.0 (*)   ? Glucose, Bld 106 (*)   ? All other components within normal limits  ?CBC - Abnormal; Notable for the following components:  ? RBC 5.66 (*)   ? Hemoglobin 16.9 (*)   ? HCT 50.4 (*)   ? All other components within normal limits  ?D-DIMER, QUANTITATIVE  ?TROPONIN I (HIGH SENSITIVITY)  ?TROPONIN I (HIGH SENSITIVITY)  ? ? ?EKG ?EKG Interpretation ? ?Date/Time:  Tuesday Jul 20 2021 15:25:47 EDT ?Ventricular Rate:  120 ?PR Interval:    ?QRS Duration: 106 ?QT Interval:  342 ?QTC Calculation: 465 ?R Axis:   -38 ?Text Interpretation: Atrial fibrillation Ventricular premature complex Incomplete RBBB and LAFB RSR' in V1 or V2, right VCD or RVH Borderline ST depression, lateral leads No acute changes No significant change since last tracing Confirmed by Varney Biles [phone]) on 07/20/2021  4:28:31 PM ? ?Radiology ?DG Chest 2 View ? ?Result Date: 07/20/2021 ?CLINICAL DATA:  Shortness of breath EXAM: CHEST - 2 VIEW COMPARISON:  Radiograph 02/05/2020 FINDINGS: Unchanged mild cardiomegaly. There is no focal airspace disease. There is no pleural effusion. There is no pneumothorax. Bilateral shoulder degenerative changes. Thoracic spondylosis. No acute osseous abnormality. IMPRESSION: Unchanged mild cardiomegaly.  No focal airspace disease. Electronically Signed   By: Maurine Simmering M.D.   On: 07/20/2021 16:22   ? ?Procedures ?Procedures  ? ? ?Medications Ordered in ED ?Medications  ?apixaban (ELIQUIS) tablet 5 mg (has no administration in time range)  ?diltiazem (CARDIZEM) tablet 30 mg (30 mg Oral  Given 07/20/21 1658)  ?potassium chloride SA (KLOR-CON M) CR tablet 40 mEq (40 mEq Oral Given 07/20/21 1658)  ? ? ?ED Course/ Medical Decision Making/ A&P ?  ? ?                        ?Medical Decision Making ?Amount and/or Complexity of Data Reviewed ?Labs: ordered. Decision-making details documented in ED Course. ?Radiology: ordered. ? ?Risk ?Prescription drug management. ? ? ?This patient presents to the ED for concern of new onset atrial fibrillation, this involves an extensive number of treatment options, and is a complaint that carries with it a high risk of complications and morbidity.  Differential includes but is not limited to atrial fibrillation, ACS, PE, and others ? ?Co morbidities that complicate the patient evaluation ? ?History of dysrhythmia ? ? ?Additional history obtained: ? ?Additional history obtained from patient's family ?External records from outside source obtained and reviewed including note from office visit earlier today ? ? ?Lab Tests: ? ?I Ordered, and personally interpreted labs.  The pertinent results include: Potassium 3.0, hemoglobin 16.9, D-dimer 0.43, initial troponin 5 ? ? ?Imaging Studies ordered: ? ?I ordered imaging studies including chest x-ray and ultrasound of lower extremity  DVT rule out ?I independently visualized and interpreted imaging which showed active cardiopulmonary disease, no sign of DVT ?I agree with the radiologist interpretation ? ? ?Cardiac Monitoring: / EKG: ? ?[facility]

## 2021-07-20 NOTE — Progress Notes (Signed)
ANTICOAGULATION CONSULT NOTE - Initial Consult ? ?Pharmacy Consult for apixiban ?Indication: atrial fibrillation ? ?Allergies  ?Allergen Reactions  ? Buprenorphine Hcl Anaphylaxis and Itching  ? Ace Inhibitors Diarrhea  ? ? ?Patient Measurements: ?Height: 5\' 5"  (165.1 cm) ?Weight: 102.1 kg (225 lb) ?IBW/kg (Calculated) : 57 ?Heparin Dosing Weight: 80.5 kg ? ?Vital Signs: ?Temp: 98.2 ?F (36.8 ?C) (05/09 1528) ?Temp Source: Oral (05/09 1528) ?BP: 130/102 (05/09 1530) ?Pulse Rate: 69 (05/09 1530) ? ?Labs: ?Recent Labs  ?  07/20/21 ?1540  ?HGB 16.9*  ?HCT 50.4*  ?PLT 255  ?CREATININE 0.81  ? ? ?Estimated Creatinine Clearance: 73.2 mL/min (by C-G formula based on SCr of 0.81 mg/dL). ? ? ?Medical History: ?Past Medical History:  ?Diagnosis Date  ? Asthma   ? COPD (chronic obstructive pulmonary disease) (HCC)   ? questionable  ? Degeneration of intervertebral disc, site unspecified   ? Depression   ? DJD (degenerative joint disease)   ? Dyslipidemia   ? Dysrhythmia   ? GERD (gastroesophageal reflux disease)   ? Pneumonia 04/14/2013  ? Shortness of breath   ? on exertion  ? Sleep apnea   ? stopbang=4  ? Unspecified essential hypertension   ? ? ?Medications:  ?Scheduled:  ? apixaban  5 mg Oral BID  ? diltiazem  30 mg Oral Once  ? potassium chloride  40 mEq Oral Once  ? ? ?Assessment: ?71 yo F with new onset AF RVR. On bASA PTA, but no AC. Pharmacy consulted to dose apixiban. CBC wnl. ? ?Scr 0.81, age 71, weight 102 kg ? ?Patient does not meet criteria for dose reduction. ? ?Goal of Therapy:  ?Monitor platelets by anticoagulation protocol: Yes ?  ?Plan:  ?Apixaban 5 mg BID ?Cost checking Apixaban  ?Monitor CBC ? ?[address] Lyndsay Talamante ?07/20/2021,4:49 PM ? ? ?

## 2021-07-20 NOTE — Progress Notes (Signed)
Lower extremity venous bilateral study completed. ? ?Preliminary results relayed to [place], [place]. ? ?See CV Proc for preliminary results report.  ? ?Jean Rosenthal, RDMS, RVT ? ?

## 2021-07-20 NOTE — Discharge Instructions (Addendum)
You were seen today for a new onset of atrial fibrillation.  You have been prescribed Eliquis.  Please take as prescribed.  You should schedule a follow-up with soon as possible with your cardiologist.  Return to the hospital if you have a repeat of the chest pain, shortness of breath, or other life-threatening conditions ? ?Information on my medicine - ELIQUIS? (apixaban) ? ?Why was Eliquis? prescribed for you? ?Eliquis? was prescribed for you to reduce the risk of a blood clot forming that can cause a stroke if you have a medical condition called atrial fibrillation (a type of irregular heartbeat). ? ?What do You need to know about Eliquis? ? ?Take your Eliquis? TWICE DAILY - one tablet in the morning and one tablet in the evening with or without food. If you have difficulty swallowing the tablet whole please discuss with your pharmacist how to take the medication safely. ? ?Take Eliquis? exactly as prescribed by your doctor and DO NOT stop taking Eliquis? without talking to the doctor who prescribed the medication.  Stopping may increase your risk of developing a stroke.  Refill your prescription before you run out. ? ?After discharge, you should have regular check-up appointments with your healthcare provider that is prescribing your Eliquis?.  In the future your dose may need to be changed if your kidney function or weight changes by a significant amount or as you get older. ? ?What do you do if you miss a dose? ?If you miss a dose, take it as soon as you remember on the same day and resume taking twice daily.  Do not take more than one dose of ELIQUIS at the same time to make up a missed dose. ? ?Important Safety Information ?A possible side effect of Eliquis? is bleeding. You should call your healthcare provider right away if you experience any of the following: ?Bleeding from an injury or your nose that does not stop. ?Unusual colored urine (red or dark brown) or unusual colored stools (red or  black). ?Unusual bruising for unknown reasons. ?A serious fall or if you hit your head (even if there is no bleeding). ? ?Some medicines may interact with Eliquis? and might increase your risk of bleeding or clotting while on Eliquis?[name] Kitchen To help avoid this, consult your healthcare provider or pharmacist prior to using any new prescription or non-prescription medications, including herbals, vitamins, non-steroidal anti-inflammatory drugs (NSAIDs) and supplements. ? ?This website has more information on Eliquis? (apixaban): http://www.eliquis.com/eliquis/home  ?

## 2021-07-20 NOTE — ED Triage Notes (Signed)
Pt arrives via EMS from PCP with reports of Afib RVR. Pt did notice some SOB last week and ankle swelling. EMS reports HR 80-160. Pt endorses left sided pain. Pt takes 81 mg ASA daily, no hx of afib.  ?

## 2021-07-21 ENCOUNTER — Other Ambulatory Visit (HOSPITAL_COMMUNITY): Payer: Self-pay

## 2021-07-23 ENCOUNTER — Ambulatory Visit: Payer: Medicare HMO | Admitting: Cardiology

## 2021-07-23 ENCOUNTER — Encounter: Payer: Self-pay | Admitting: Cardiology

## 2021-07-23 VITALS — BP 118/78 | HR 106 | Ht 66.0 in | Wt 232.0 lb

## 2021-07-23 DIAGNOSIS — I4891 Unspecified atrial fibrillation: Secondary | ICD-10-CM | POA: Diagnosis not present

## 2021-07-23 DIAGNOSIS — I10 Essential (primary) hypertension: Secondary | ICD-10-CM

## 2021-07-23 DIAGNOSIS — I272 Pulmonary hypertension, unspecified: Secondary | ICD-10-CM

## 2021-07-23 MED ORDER — POTASSIUM CHLORIDE CRYS ER 20 MEQ PO TBCR: 40.0000 meq | EXTENDED_RELEASE_TABLET | Freq: Every day | ORAL | 1 refills | Status: DC

## 2021-07-23 MED ORDER — DILTIAZEM HCL 30 MG PO TABS: 30.0000 mg | ORAL_TABLET | Freq: Two times a day (BID) | ORAL | 1 refills | Status: DC

## 2021-07-23 NOTE — Progress Notes (Signed)
? ? ? ?Clinical Summary ?[name] is a 71 y.o.female seen today as a new patient.  ? ?Prevsiously followed by [facility] Cardiology Dr Rosemary Holms ? ? ?1.PSVT ?- noted on prior monitor ?- from [place] notes has not been started on scehduled av nodal agents due to 3.1 sec asymptomatic pause ? ? ?2. Afib ?- new diagnosis during 07/2021 ER visit ?- started on eliquis ? ?- reports was at pcp visit and noted to have irregular heart beat. ?- some nonspecific fatigue she relates to recent travel ?- some SOB last 2 weeks ?- started eliquis.  ? ? ? ? ?3. LE edema ?11/2020 echo LVEF 60-65%, indet diastolic, mild RVE normal with mod pulm HTN.  ?- takes lasix 20mg  daily, will take additional 20mg  as needed. Home weight 225 lbs and stable.  ? ?3. OSA ?- followed by Dr ? ? ?4. COPD ? ? ?5. HTN ? ?6. Hypokalemia ?- K was 3 during ER visit ? ?7. Pulmonary HTN ?- followed by pulmonary ?11/2020 echo PASP 47 ?- has PFTs, sleep test pending.  ? ? ? ?Past Medical History:  ?Diagnosis Date  ? Asthma   ? COPD (chronic obstructive pulmonary disease) (HCC)   ? questionable  ? Degeneration of intervertebral disc, site unspecified   ? Depression   ? DJD (degenerative joint disease)   ? Dyslipidemia   ? Dysrhythmia   ? GERD (gastroesophageal reflux disease)   ? Pneumonia 04/14/2013  ? Shortness of breath   ? on exertion  ? Sleep apnea   ? stopbang=4  ? Unspecified essential hypertension   ? ? ? ?Allergies  ?Allergen Reactions  ? Buprenorphine Hcl Anaphylaxis and Itching  ? Ace Inhibitors Diarrhea  ? ? ? ?Current Outpatient Medications  ?Medication Sig Dispense Refill  ? albuterol (PROVENTIL HFA;VENTOLIN HFA) 108 (90 Base) MCG/ACT inhaler Inhale 1-2 puffs into the lungs every 6 (six) hours as needed for wheezing or shortness of breath. 1 Inhaler 6  ? amLODipine (NORVASC) 5 MG tablet Take 5 mg by mouth every evening.    ? apixaban (ELIQUIS) 5 MG TABS tablet Take 1 tablet (5 mg total) by mouth 2 (two) times daily. 60 tablet 0  ?  aspirin EC 81 MG tablet Take 81 mg by mouth daily. Swallow whole.    ? atorvastatin (LIPITOR) 10 MG tablet Take 10 mg by mouth daily.    ? budesonide-formoterol (SYMBICORT) 80-4.5 MCG/ACT inhaler Inhale 2 puffs into the lungs 2 (two) times daily. 2 Inhaler 0  ? cholecalciferol (VITAMIN D3) 25 MCG (1000 UNIT) tablet Take 1,000 Units by mouth daily.    ? diltiazem (CARDIZEM) 30 MG tablet TAKE 1 TABLET(30 MG) BY MOUTH EVERY 8 HOURS AS NEEDED (Patient taking differently: Take 30 mg by mouth 3 (three) times daily as needed. Blood pressure) 270 tablet 1  ? furosemide (LASIX) 20 MG tablet Take 20 mg by mouth daily.     ? ibuprofen (ADVIL) 800 MG tablet Take 1 tablet (800 mg total) by mouth 3 (three) times daily. (Patient not taking: Reported on 04/02/2021) 30 tablet 0  ? losartan-hydrochlorothiazide (HYZAAR) 100-25 MG tablet TAKE 1 TABLET EVERY DAY 90 tablet 2  ? magnesium oxide (MAG-OX) 400 MG tablet Take 400 mg by mouth daily.    ? meloxicam (MOBIC) 15 MG tablet Take 15 mg by mouth at bedtime.     ? Multiple Vitamin (MULTIVITAMIN WITH MINERALS) TABS tablet Take 1 tablet by mouth daily.    ? omeprazole (PRILOSEC) 20 MG capsule  Take 20 mg by mouth daily.    ? ondansetron (ZOFRAN) 4 MG tablet Take 1 tablet (4 mg total) by mouth daily as needed for nausea or vomiting. 30 tablet 1  ? oxyCODONE-acetaminophen (PERCOCET/ROXICET) 5-325 MG tablet Take 1-2 tablets by mouth every 4 (four) hours as needed for moderate pain. 30 tablet 0  ? Potassium Gluconate 550 MG TABS Take 550 mg by mouth daily.    ? triamcinolone (NASACORT) 55 MCG/ACT AERO nasal inhaler Place 1 spray into the nose daily as needed (allergies).    ? zinc gluconate 50 MG tablet Take 50 mg by mouth daily.    ? ?No current facility-administered medications for this visit.  ? ? ? ?Past Surgical History:  ?Procedure Laterality Date  ? APPENDECTOMY    ? ECTOPIC PREGNANCY SURGERY    ? HERNIA REPAIR    ? KNEE ARTHROSCOPY  03/14/2008  ? Right knee  ? KNEE ARTHROSCOPY   03/14/2010  ? Left knee  ? TOTAL KNEE ARTHROPLASTY  11/22/2011  ? Procedure: TOTAL KNEE ARTHROPLASTY;  Surgeon: Drucilla SchmidtJames P Aplington, MD;  Location: [facility];  Service: Orthopedics;  Laterality: Left;  ? TOTAL KNEE REVISION Left 09/23/2013  ? Procedure: REVISION LEFT TOTAL KNEE WITH POLY EXCHANGE UPSIZE AND PATELLA REVISION;  Surgeon: Shelda PalMatthew D Olin, MD;  Location: [facility];  Service: Orthopedics;  Laterality: Left;  ? UPPER GI ENDOSCOPY N/A 04/13/2021  ? Procedure: UPPER GI ENDOSCOPY;  Surgeon: Luretha MurphyMartin, Matthew, MD;  Location: [facility];  Service: General;  Laterality: N/A;  ? XI ROBOTIC ASSISTED HIATAL HERNIA REPAIR N/A 04/13/2021  ? Procedure: ROBOTIC REPAIR OF LARGE TYPE III HIATAL HERNIA WITH NISSEN, AND GASTROPEXY;  Surgeon: Luretha MurphyMartin, Matthew, MD;  Location: [facility];  Service: General;  Laterality: N/A;  ? ? ? ?Allergies  ?Allergen Reactions  ? Buprenorphine Hcl Anaphylaxis and Itching  ? Ace Inhibitors Diarrhea  ? ? ? ? ?Family History  ?Problem Relation Age of Onset  ? Lung cancer Father   ? COPD Mother   ? Hypertension Mother   ? Melanoma Brother   ? Alcohol abuse Brother   ? Other Sister   ?     MGUS  ? ? ? ?Social History ?[name] reports that she has never smoked. She has been exposed to tobacco smoke. She has never used smokeless tobacco. ?[name] reports current alcohol use of about 1.0 standard drink per week. ? ? ?Review of Systems ?CONSTITUTIONAL: No weight loss, fever, chills, weakness or fatigue.  ?HEENT: Eyes: No visual loss, blurred vision, double vision or yellow sclerae.No hearing loss, sneezing, congestion, runny nose or sore throat.  ?SKIN: No rash or itching.  ?CARDIOVASCULAR: per hpi ?RESPIRATORY: No shortness of breath, cough or sputum.  ?GASTROINTESTINAL: No anorexia, nausea, vomiting or diarrhea. No abdominal pain or blood.  ?GENITOURINARY: No burning on urination, no polyuria ?NEUROLOGICAL: No headache, dizziness, syncope, paralysis, ataxia, numbness or tingling in the  extremities. No change in bowel or bladder control.  ?MUSCULOSKELETAL: No muscle, back pain, joint pain or stiffness.  ?LYMPHATICS: No enlarged nodes. No history of splenectomy.  ?PSYCHIATRIC: No history of depression or anxiety.  ?ENDOCRINOLOGIC: No reports of sweating, cold or heat intolerance. No polyuria or polydipsia.  ?. ? ? ?Physical Examination ?Today's Vitals  ? 07/23/21 0904  ?BP: 118/78  ?Pulse: (!) 106  ?SpO2: 96%  ?Weight: 232 lb (105.2 kg)  ?Height: 5\' 6"  (1.676 m)  ? ?Body mass index is 37.45 kg/m?. ? ?Gen: resting comfortably, no acute distress ?HEENT:  no scleral icterus, pupils equal round and reactive, no palptable cervical adenopathy,  ?CV: irreg, no /mr/ gno jvd ?Resp: Clear to auscultation bilaterally ?GI: abdomen is soft, non-tender, non-distended, normal bowel sounds, no hepatosplenomegaly ?MSK: extremities are warm, no edema.  ?Skin: warm, no rash ?Neuro:  no focal deficits ?Psych: appropriate affect ? ? ?Diagnostic Studies ? ?Long term monitor 11 days 11/28/2019 - 12/09/2019: ?Dominant rhythm: Sinus. ?HR 35-222 bpm. Avg HR 83 bpm. ?595 episodes of SVT, fastest at 147 bpm for 4 min 3 sec, longest for 30 min 51 sec at 125 bpm. ?6.3% SVE burden. ?1 episode of ventricular tachycardia for 6 beats at 184 bpm ?<1% VE burden. ?1 episode of asymptomatic 3.1 sec sinus pause at 2:01 PM. ?No atrial fibrillation/atrial flutter/high grade AV block, ?1 patient triggered event, correlates with isolated SVE.  ?  ?Echocardiogram 11/07/2019:  ?Left ventricle cavity is normal in size. Moderate concentric hypertrophy  ?of the left ventricle. Normal global wall motion. Normal LV systolic  ?function with EF 55%. Indeterminate diastolic filling pattern.  ?Left atrial cavity is severely dilated.  ?Right atrial cavity is mildly dilated.  ?Trileaflet aortic valve.  Trace aortic regurgitation.  ?Mild (Grade I) mitral regurgitation.  ?Moderate tricuspid regurgitation. Estimated pulmonary artery systolic  ?pressure 38 mmHg.   ?  ?EKG 10/14/2019: ?Sinus rhythm 81 bpm  ?Borderline left atrial enlargement ?Incomplete right bundle Evelene Roussin block ?Occasional PAC ?  ? ? ?Assessment and Plan  ? ?1.Afib ?- new diagnosis during recent ER vist ?- EKG s

## 2021-07-23 NOTE — Patient Instructions (Addendum)
Medication Instructions:  ?Your physician has recommended you make the following change in your medication:  ?Start potassium 40 mcq by mouth once a day ?Take diltiazem 30 mg twice a day, may take additional tablet as needed ?Stop aspirin ?Continue all other medications as directed ? ?Labwork: ?BMET, Magnesium at [facility] in 2 weeks ? ?Testing/Procedures: ?none ? ?Follow-Up: ?Your physician recommends that you schedule a follow-up appointment in: 3 months ? ?Any Other Special Instructions Will Be Listed Below (If Applicable). ? ?If you need a refill on your cardiac medications before your next appointment, please call your pharmacy. ? ?

## 2021-07-27 ENCOUNTER — Other Ambulatory Visit: Payer: Self-pay

## 2021-07-27 DIAGNOSIS — I4891 Unspecified atrial fibrillation: Secondary | ICD-10-CM

## 2021-07-27 MED ORDER — DILTIAZEM HCL 30 MG PO TABS: 30.0000 mg | ORAL_TABLET | Freq: Two times a day (BID) | ORAL | 1 refills | Status: DC

## 2021-07-29 ENCOUNTER — Other Ambulatory Visit: Payer: Self-pay

## 2021-07-29 DIAGNOSIS — I4891 Unspecified atrial fibrillation: Secondary | ICD-10-CM

## 2021-07-29 MED ORDER — DILTIAZEM HCL 30 MG PO TABS: 30.0000 mg | ORAL_TABLET | Freq: Two times a day (BID) | ORAL | 1 refills | Status: DC

## 2021-07-30 ENCOUNTER — Other Ambulatory Visit: Payer: Self-pay

## 2021-07-30 ENCOUNTER — Encounter: Payer: Self-pay | Admitting: Cardiology

## 2021-07-30 DIAGNOSIS — I4891 Unspecified atrial fibrillation: Secondary | ICD-10-CM

## 2021-07-30 MED ORDER — APIXABAN 5 MG PO TABS: 5.0000 mg | ORAL_TABLET | Freq: Two times a day (BID) | ORAL | 1 refills | Status: DC

## 2021-07-30 NOTE — Telephone Encounter (Signed)
Prescription refill request for Eliquis received. Indication: Afib  Last office visit:07/23/21 ([place]) Scr: 0.81 (07/20/21)  Age: 71 Weight: 105.2kg  Appropriate dose and refill sent to requested pharmacy.

## 2021-08-03 ENCOUNTER — Ambulatory Visit: Payer: Medicare HMO | Admitting: Pulmonary Disease

## 2021-08-03 ENCOUNTER — Other Ambulatory Visit: Payer: Self-pay

## 2021-08-03 ENCOUNTER — Encounter: Payer: Self-pay | Admitting: Pulmonary Disease

## 2021-08-03 DIAGNOSIS — I272 Pulmonary hypertension, unspecified: Secondary | ICD-10-CM | POA: Diagnosis not present

## 2021-08-03 DIAGNOSIS — J4530 Mild persistent asthma, uncomplicated: Secondary | ICD-10-CM | POA: Diagnosis not present

## 2021-08-03 DIAGNOSIS — I4891 Unspecified atrial fibrillation: Secondary | ICD-10-CM

## 2021-08-03 MED ORDER — DILTIAZEM HCL 30 MG PO TABS: 30.0000 mg | ORAL_TABLET | Freq: Two times a day (BID) | ORAL | 1 refills | Status: DC

## 2021-08-03 NOTE — Assessment & Plan Note (Signed)
Repeat spirometry pre and post, expect this to have improved. She has weaned herself off Symbicort and that is okay for now pending about results

## 2021-08-03 NOTE — Telephone Encounter (Signed)
No restriction on travel. Timing of conversion depends on several things including how fast heart rates are and how symptomatic the patient is. Often patients self convert over time and we do not have to do the electrical conversion. For her given that her heart rates are fairly well controlled and limited symptoms would give her heart some time to self convert before we commit to converting with a shock  Dominga Ferry MD

## 2021-08-03 NOTE — Assessment & Plan Note (Signed)
She has developed new onset A-fib and continues to be in A-fib in spite of Cardizem.  She will likely need TEE induced cardioversion. Would like to see you how pulmonary hypertension is on repeat echo. Meantime we will evaluate for sleep disordered breathing with a home sleep test

## 2021-08-03 NOTE — Progress Notes (Signed)
   Subjective:    Patient ID: Kathryn Olsen, female    DOB: 09/02/1950, 71 y.o.   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  Does not want to do PFT because of enclosed space.    She underwent hiatal hernia surgery /Nissen fundoplication by Dr. Daphine Deutscher and recovered uneventfully. Since then dyspnea is improved and she is completely stopped taking her Symbicort, rarely needs albuterol. She developed new onset atrial fibrillation which was picked up during her routine PCP visit and sent to the ED.  Since then she has seen cardiology, reviewed their note, started on Cardizem, Eliquis with a plan to try TEE/cardioversion if required in 3 months. Weight has decreased from 242 to 234 pounds. She developed bipedal edema and was given Lasix  She has not performed PFTs because she does not want to be in an enclosed space.  HST has not been scheduled yet     Significant tests/ events reviewed  11/2013 PFTs >> ratio 63 with FEV1 of 1.39 L (55% predicted) pre-bronchodilator, postbronchodilator ratio 73 FEV1 2.03 L (80% predicted, 46% change) DLCO of 99% predicted     Echo 11/2020 nml LVEF , RVSP 47 Echo 10/2019 Concentric LVH, moderate TR, RVSP 38   04/2020 Eos 300   Review of Systems neg for any significant sore throat, dysphagia, itching, sneezing, nasal congestion or excess/ purulent secretions, fever, chills, sweats, unintended wt loss, pleuritic or exertional cp, hempoptysis, orthopnea pnd or change in chronic leg swelling. Also denies presyncope, palpitations, heartburn, abdominal pain, nausea, vomiting, diarrhea or change in bowel or urinary habits, dysuria,hematuria, rash,  arthralgias, visual complaints, headache, numbness weakness or ataxia.     Objective:   Physical Exam  Gen. Pleasant, obese, in no distress ENT - no lesions, no post nasal drip Neck: No JVD, no thyromegaly, no carotid bruits Lungs: no use of accessory muscles, no dullness to percussion, decreased without rales or rhonchi  Cardiovascular: Rhythm irregular, heart sounds  normal, no murmurs or gallops, no peripheral edema Musculoskeletal: No deformities, no cyanosis or clubbing , no tremors       Assessment & Plan:

## 2021-08-03 NOTE — Patient Instructions (Signed)
  X spirometry pre-post  X HST

## 2021-08-13 ENCOUNTER — Encounter: Payer: Self-pay | Admitting: *Deleted

## 2021-09-10 LAB — BASIC METABOLIC PANEL
BUN/Creatinine Ratio: 19 (ref 12–28)
BUN: 14 mg/dL (ref 8–27)
CO2: 21 mmol/L (ref 20–29)
Calcium: 9.8 mg/dL (ref 8.7–10.3)
Chloride: 105 mmol/L (ref 96–106)
Creatinine, Ser: 0.75 mg/dL (ref 0.57–1.00)
Glucose: 105 mg/dL — ABNORMAL HIGH (ref 70–99)
Potassium: 4.3 mmol/L (ref 3.5–5.2)
Sodium: 145 mmol/L — ABNORMAL HIGH (ref 134–144)
eGFR: 84 mL/min/{1.73_m2} (ref >59)

## 2021-09-10 LAB — MAGNESIUM: Magnesium: 2.5 mg/dL — ABNORMAL HIGH (ref 1.6–2.3)

## 2021-10-12 ENCOUNTER — Encounter: Payer: Self-pay | Admitting: Cardiology

## 2021-10-12 ENCOUNTER — Telehealth: Payer: Self-pay | Admitting: Cardiology

## 2021-10-12 ENCOUNTER — Ambulatory Visit: Payer: Medicare HMO | Admitting: Cardiology

## 2021-10-12 ENCOUNTER — Other Ambulatory Visit: Payer: Self-pay | Admitting: Cardiology

## 2021-10-12 VITALS — BP 110/84 | HR 92 | Ht 66.0 in | Wt 233.6 lb

## 2021-10-12 DIAGNOSIS — I4819 Other persistent atrial fibrillation: Secondary | ICD-10-CM

## 2021-10-12 DIAGNOSIS — R600 Localized edema: Secondary | ICD-10-CM

## 2021-10-12 DIAGNOSIS — I4891 Unspecified atrial fibrillation: Secondary | ICD-10-CM

## 2021-10-12 MED ORDER — DILTIAZEM HCL 60 MG PO TABS: 60.0000 mg | ORAL_TABLET | Freq: Two times a day (BID) | ORAL | 1 refills | Status: DC

## 2021-10-12 NOTE — Telephone Encounter (Signed)
Checking percert on the following patient for testing scheduled at [facility].     Cardioversion dx: afib  10/21/2021

## 2021-10-12 NOTE — Telephone Encounter (Signed)
Called [facility] and spoke with Shanda Bumps, clarified medication.

## 2021-10-12 NOTE — Progress Notes (Signed)
Clinical Summary Kathryn Olsen is a 71 y.o.female seen today for follow up of the following medical problems.     Prevsiously followed by [facility] Cardiology Dr Rosemary Holms     1.PSVT - noted on prior monitor - from [place] notes has not been started on scehduled av nodal agents due to 3.1 sec asymptomatic pause     2. Afib - new diagnosis during 07/2021 ER visit -last visit changed to dilt 30mg  bid as opposed to prn - ongiong palpitations. Taking diltaizem 30mg  bid.  - compliant with eliquis     3. LE edema 11/2020 echo LVEF 60-65%, indet diastolic, mild RVE normal with mod pulm HTN.  - swelling is up and down   3. OSA screen - pending eval with pulmonary     4. COPD     5. HTN   6. Hypokalemia - K was 3 during ER visit - normal by recent labs   7. Pulmonary HTN - followed by pulmonary 11/2020 echo PASP 47 - has PFTs, sleep test pending.   Has a house outside of [facility], she is from [place]2020. Often drives down there for some time in the fall.    Past Medical History:  Diagnosis Date   Asthma    COPD (chronic obstructive pulmonary disease) (HCC)    questionable   Degeneration of intervertebral disc, site unspecified    Depression    DJD (degenerative joint disease)    Dyslipidemia    Dysrhythmia    GERD (gastroesophageal reflux disease)    Pneumonia 04/14/2013   Shortness of breath    on exertion   Sleep apnea    stopbang=4   Unspecified essential hypertension      Allergies  Allergen Reactions   Buprenorphine Hcl Anaphylaxis and Itching   Ace Inhibitors Diarrhea     Current Outpatient Medications  Medication Sig Dispense Refill   albuterol (PROVENTIL HFA;VENTOLIN HFA) 108 (90 Base) MCG/ACT inhaler Inhale 1-2 puffs into the lungs every 6 (six) hours as needed for wheezing or shortness of breath. 1 Inhaler 6   amLODipine (NORVASC) 5 MG tablet Take 5 mg by mouth every evening.     apixaban (ELIQUIS) 5 MG TABS tablet Take 1 tablet (5  mg total) by mouth 2 (two) times daily. 180 tablet 1   atorvastatin (LIPITOR) 10 MG tablet Take 10 mg by mouth daily.     budesonide-formoterol (SYMBICORT) 80-4.5 MCG/ACT inhaler Inhale 2 puffs into the lungs 2 (two) times daily. 2 Inhaler 0   cholecalciferol (VITAMIN D3) 25 MCG (1000 UNIT) tablet Take 1,000 Units by mouth daily.     diltiazem (CARDIZEM) 30 MG tablet Take 1 tablet (30 mg total) by mouth 2 (two) times daily. MAY TAKE ADDITIONAL TABLET(30 MG) NEEDED Strength: 30 mg 270 tablet 1   furosemide (LASIX) 20 MG tablet Take 20 mg by mouth daily.      ibuprofen (ADVIL) 800 MG tablet Take 1 tablet (800 mg total) by mouth 3 (three) times daily. 30 tablet 0   losartan-hydrochlorothiazide (HYZAAR) 100-25 MG tablet TAKE 1 TABLET EVERY DAY 90 tablet 2   magnesium oxide (MAG-OX) 400 MG tablet Take 400 mg by mouth daily.     meloxicam (MOBIC) 15 MG tablet Take 15 mg by mouth at bedtime.      Multiple Vitamin (MULTIVITAMIN WITH MINERALS) TABS tablet Take 1 tablet by mouth daily.     omeprazole (PRILOSEC) 20 MG capsule Take 20 mg by mouth daily.  ondansetron (ZOFRAN) 4 MG tablet Take 1 tablet (4 mg total) by mouth daily as needed for nausea or vomiting. 30 tablet 1   oxyCODONE-acetaminophen (PERCOCET/ROXICET) 5-325 MG tablet Take 1-2 tablets by mouth every 4 (four) hours as needed for moderate pain. 30 tablet 0   potassium chloride SA (KLOR-CON M) 20 MEQ tablet Take 2 tablets (40 mEq total) by mouth daily. 180 tablet 1   Potassium Gluconate 550 MG TABS Take 550 mg by mouth daily.     triamcinolone (NASACORT) 55 MCG/ACT AERO nasal inhaler Place 1 spray into the nose daily as needed (allergies).     zinc gluconate 50 MG tablet Take 50 mg by mouth daily.     No current facility-administered medications for this visit.     Past Surgical History:  Procedure Laterality Date   APPENDECTOMY     ECTOPIC PREGNANCY SURGERY     HERNIA REPAIR     KNEE ARTHROSCOPY  03/14/2008   Right knee   KNEE  ARTHROSCOPY  03/14/2010   Left knee   TOTAL KNEE ARTHROPLASTY  11/22/2011   Procedure: TOTAL KNEE ARTHROPLASTY;  Surgeon: Drucilla Schmidt, MD;  Location: [facility];  Service: Orthopedics;  Laterality: Left;   TOTAL KNEE REVISION Left 09/23/2013   Procedure: REVISION LEFT TOTAL KNEE WITH POLY EXCHANGE UPSIZE AND PATELLA REVISION;  Surgeon: Shelda Pal, MD;  Location: [facility];  Service: Orthopedics;  Laterality: Left;   UPPER GI ENDOSCOPY N/A 04/13/2021   Procedure: UPPER GI ENDOSCOPY;  Surgeon: Luretha Murphy, MD;  Location: [facility];  Service: General;  Laterality: N/A;   XI ROBOTIC ASSISTED HIATAL HERNIA REPAIR N/A 04/13/2021   Procedure: 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 She has been exposed to tobacco smoke. She has never used smokeless tobacco. [name] reports current alcohol use of about 1.0 standard drink of alcohol per week.   Review of Systems CONSTITUTIONAL: No weight loss, fever, chills, weakness or fatigue.  HEENT: Eyes: No visual loss, blurred vision, double vision or yellow sclerae.No hearing loss, sneezing, congestion, runny nose or sore throat.  SKIN: No rash or itching.  CARDIOVASCULAR: per hpi RESPIRATORY: No shortness of breath, cough or sputum.  GASTROINTESTINAL: No anorexia, nausea, vomiting or diarrhea. No abdominal pain or blood.  GENITOURINARY: No burning on urination, no polyuria NEUROLOGICAL: No headache, dizziness, syncope, paralysis, ataxia, numbness  or tingling in the extremities. No change in bowel or bladder control.  MUSCULOSKELETAL: No muscle, back pain, joint pain or stiffness.  LYMPHATICS: No enlarged nodes. No history of splenectomy.  PSYCHIATRIC: No history of depression or anxiety.  ENDOCRINOLOGIC: No reports of sweating, cold or heat intolerance. No polyuria or polydipsia.  [name] Kitchen   Physical Examination Today's Vitals   10/12/21 0909  BP: 110/84  Pulse: 92  SpO2: 96%  Weight: 233 lb 9.6 oz (106 kg)  Height: 5\' 6"  (1.676 m)  Body mass index is 37.7 kg/m.  Gen: resting comfortably, no acute distress HEENT: no scleral icterus, pupils equal round and reactive, no palptable cervical adenopathy,  CV: irreg, no m/r/g no jvd Resp: Clear to auscultation bilaterally GI: abdomen is soft, non-tender, non-distended, normal bowel sounds, no hepatosplenomegaly MSK: extremities are warm, no edema.  Skin: warm, no rash Neuro:  no focal deficits Psych: appropriate affect   Diagnostic Studies  Long term monitor 11 days 11/28/2019 - 12/09/2019: Dominant rhythm: Sinus. HR 35-222 bpm. Avg HR 83 bpm. 595 episodes of SVT, fastest at 147 bpm for 4 min 3 sec, longest for 30 min 51 sec at 125 bpm. 6.3% SVE burden. 1 episode of ventricular tachycardia for 6 beats at 184 bpm <1% VE burden. 1 episode of asymptomatic 3.1 sec sinus pause at 2:01 PM. No atrial fibrillation/atrial flutter/high grade AV block, 1 patient triggered event, correlates with isolated SVE.    Echocardiogram 11/07/2019:  Left ventricle cavity is normal in size. Moderate concentric hypertrophy  of the left ventricle. Normal global wall motion. Normal LV systolic  function with EF 55%. Indeterminate diastolic filling pattern.  Left atrial cavity is severely dilated.  Right atrial cavity is mildly dilated.  Trileaflet aortic valve.  Trace aortic regurgitation.  Mild (Grade I) mitral regurgitation.  Moderate tricuspid regurgitation. Estimated pulmonary artery systolic   pressure 38 mmHg.    EKG 10/14/2019: Sinus rhythm 81 bpm  Borderline left atrial enlargement Incomplete right bundle Tokiko Diefenderfer block Occasional PAC      Assessment and Plan  1.Persistent Afib/acquired thrombophilia - new diagnosis during 07/2021 ER visit - ongoign palpitations, rates today low 100s afib by EKG. Increase diltizem to 60mg  bid - given relatively new onset of afib will atttempt DCCV to restore SR. She has not missed any doses of anticoagulation - since on dilt stop amlodopine.    2. LE edema - change lasix to 20mg  may take additional 20mg  prn      , M.D

## 2021-10-12 NOTE — Patient Instructions (Addendum)
Medication Instructions:  Your physician has recommended you make the following change in your medication:  Increase diltiazem to 60 mg bid Stop amlodipine Take lasix 20 mg daily, may take additional 20 mg as needed for swelling Continue all other medications as prescribed  Labwork: none  Testing/Procedures: cardioversion  Follow-Up:  Your physician recommends that you schedule a follow-up appointment in: 3 months  Any Other Special Instructions Will Be Listed Below (If Applicable).  If you need a refill on your cardiac medications before your next appointment, please call your pharmacy

## 2021-10-12 NOTE — Telephone Encounter (Signed)
Follow Up:      She needs clarification on directions for patient's Diltiazem.

## 2021-10-13 NOTE — Patient Instructions (Signed)
Kathryn Olsen  10/13/2021     @PREFPERIOPPHARMACY @   Your procedure is scheduled on  10/21/2021.   Report to 12/21/2021 at  0730  A.M.   Call this number if you have problems the morning of surgery:  [phone]   Remember:  Do not eat or drink after midnight.       DO NOT miss any doses of your eliquis before your procedure.     Use your inhaler before you come and bring your rescue inhaler with you.     Take these medicines the morning of surgery with A SIP OF WATER                cardiazem, mobic or ultram (if needed).    Do not wear jewelry, make-up or nail polish.  Do not wear lotions, powders, or perfumes, or deodorant.  Do not shave 48 hours prior to surgery.  Men may shave face and neck.  Do not bring valuables to the hospital.  [facility] is not responsible for any belongings or valuables.  Contacts, dentures or bridgework may not be worn into surgery.  Leave your suitcase in the car.  After surgery it may be brought to your room.  For patients admitted to the hospital, discharge time will be determined by your treatment team.  Patients discharged the day of surgery will not be allowed to drive home and must have someone with them for 24 hours.    Special instructions:   DO NOT smoke tobacco or vape for 24 hours before your procedure.  Please read over the following fact sheets that you were given. Anesthesia Post-op Instructions and Care and Recovery After Surgery      Electrical Cardioversion Electrical cardioversion is the delivery of a jolt of electricity to restore a normal rhythm to the heart. A rhythm that is too fast or is not regular keeps the heart from pumping well. In this procedure, sticky patches or metal paddles are placed on the chest to deliver electricity to the heart from a device. This procedure may be done in an emergency if: There is low or no blood pressure as a result of the heart rhythm. Normal rhythm must be  restored as fast as possible to protect the brain and heart from further damage. It may save a life. This may also be a scheduled procedure for irregular or fast heart rhythms that are not immediately life-threatening. Tell a health care provider about: Any allergies you have. All medicines you are taking, including vitamins, herbs, eye drops, creams, and over-the-counter medicines. Any problems you or family members have had with anesthetic medicines. Any blood disorders you have. Any surgeries you have had. Any medical conditions you have. Whether you are pregnant or may be pregnant. What are the risks? Generally, this is a safe procedure. However, problems may occur, including: Allergic reactions to medicines. A blood clot that breaks free and travels to other parts of your body. The possible return of an abnormal heart rhythm within hours or days after the procedure. Your heart stopping (cardiac arrest). This is rare. What happens before the procedure? Medicines Your health care provider may have you start taking: Blood-thinning medicines (anticoagulants) so your blood does not clot as easily. Medicines to help stabilize your heart rate and rhythm. Ask your health care provider about: Changing or stopping your regular medicines. This is especially important if you are taking diabetes medicines or blood thinners. Taking medicines such  as aspirin and ibuprofen. These medicines can thin your blood. Do not take these medicines unless your health care provider tells you to take them. Taking over-the-counter medicines, vitamins, herbs, and supplements. General instructions Follow instructions from your health care provider about eating or drinking restrictions. Plan to have someone take you home from the hospital or clinic. If you will be going home right after the procedure, plan to have someone with you for 24 hours. Ask your health care provider what steps will be taken to help prevent  infection. These may include washing your skin with a germ-killing soap. What happens during the procedure?  An IV will be inserted into one of your veins. Sticky patches (electrodes) or metal paddles may be placed on your chest. You will be given a medicine to help you relax (sedative). An electrical shock will be delivered. The procedure may vary among health care providers and hospitals. What can I expect after the procedure? Your blood pressure, heart rate, breathing rate, and blood oxygen level will be monitored until you leave the hospital or clinic. Your heart rhythm will be watched to make sure it does not change. You may have some redness on the skin where the shocks were given. Follow these instructions at home: Do not drive for 24 hours if you were given a sedative during your procedure. Take over-the-counter and prescription medicines only as told by your health care provider. Ask your health care provider how to check your pulse. Check it often. Rest for 48 hours after the procedure or as told by your health care provider. Avoid or limit your caffeine use as told by your health care provider. Keep all follow-up visits as told by your health care provider. This is important. Contact a health care provider if: You feel like your heart is beating too quickly or your pulse is not regular. You have a serious muscle cramp that does not go away. Get help right away if: You have discomfort in your chest. You are dizzy or you feel faint. You have trouble breathing or you are short of breath. Your speech is slurred. You have trouble moving an arm or leg on one side of your body. Your fingers or toes turn cold or blue. Summary Electrical cardioversion is the delivery of a jolt of electricity to restore a normal rhythm to the heart. This procedure may be done right away in an emergency or may be a scheduled procedure if the condition is not an emergency. Generally, this is a safe  procedure. After the procedure, check your pulse often as told by your health care provider. This information is not intended to replace advice given to you by your health care provider. Make sure you discuss any questions you have with your health care provider. Document Revised: 01/28/2021 Document Reviewed: 10/01/2018 Elsevier Patient Education  2023 [employer] Monitored Anesthesia Care, Care After This sheet gives you information about how to care for yourself after your procedure. Your health care provider may also give you more specific instructions. If you have problems or questions, contact your health care provider. What can I expect after the procedure? After the procedure, it is common to have: Tiredness. Forgetfulness about what happened after the procedure. Impaired judgment for important decisions. Nausea or vomiting. Some difficulty with balance. Follow these instructions at home: For the time period you were told by your health care provider:     Rest as needed. Do not participate in activities where you could fall or  become injured. Do not drive or use machinery. Do not drink alcohol. Do not take sleeping pills or medicines that cause drowsiness. Do not make important decisions or sign legal documents. Do not take care of children on your own. Eating and drinking Follow the diet that is recommended by your health care provider. Drink enough fluid to keep your urine pale yellow. If you vomit: Drink water, juice, or soup when you can drink without vomiting. Make sure you have little or no nausea before eating solid foods. General instructions Have a responsible adult stay with you for the time you are told. It is important to have someone help care for you until you are awake and alert. Take over-the-counter and prescription medicines only as told by your health care provider. If you have sleep apnea, surgery and certain medicines can increase your risk for  breathing problems. Follow instructions from your health care provider about wearing your sleep device: Anytime you are sleeping, including during daytime naps. While taking prescription pain medicines, sleeping medicines, or medicines that make you drowsy. Avoid smoking. Keep all follow-up visits as told by your health care provider. This is important. Contact a health care provider if: You keep feeling nauseous or you keep vomiting. You feel light-headed. You are still sleepy or having trouble with balance after 24 hours. You develop a rash. You have a fever. You have redness or swelling around the IV site. Get help right away if: You have trouble breathing. You have new-onset confusion at home. Summary For several hours after your procedure, you may feel tired. You may also be forgetful and have poor judgment. Have a responsible adult stay with you for the time you are told. It is important to have someone help care for you until you are awake and alert. Rest as told. Do not drive or operate machinery. Do not drink alcohol or take sleeping pills. Get help right away if you have trouble breathing, or if you suddenly become confused. This information is not intended to replace advice given to you by your health care provider. Make sure you discuss any questions you have with your health care provider. Document Revised: 02/02/2021 Document Reviewed: 01/31/2019 Elsevier Patient Education  2023 [employer].

## 2021-10-14 ENCOUNTER — Encounter (HOSPITAL_COMMUNITY)
Admission: RE | Admit: 2021-10-14 | Discharge: 2021-10-14 | Disposition: A | Discharge status: Home / Self Care | Payer: Medicare HMO | Source: Ambulatory Visit | Attending: Cardiology | Admitting: Cardiology

## 2021-10-14 ENCOUNTER — Encounter (HOSPITAL_COMMUNITY): Payer: Self-pay

## 2021-10-14 DIAGNOSIS — Z01812 Encounter for preprocedural laboratory examination: Secondary | ICD-10-CM | POA: Diagnosis present

## 2021-10-14 DIAGNOSIS — Z79899 Other long term (current) drug therapy: Secondary | ICD-10-CM | POA: Insufficient documentation

## 2021-10-14 LAB — BASIC METABOLIC PANEL
Anion gap: 7 (ref 5–15)
BUN: 19 mg/dL (ref 8–23)
CO2: 26 mmol/L (ref 22–32)
Calcium: 9.2 mg/dL (ref 8.9–10.3)
Chloride: 109 mmol/L (ref 98–111)
Creatinine, Ser: 0.81 mg/dL (ref 0.44–1.00)
GFR, Estimated: >60 mL/min (ref >60)
Glucose, Bld: 117 mg/dL — ABNORMAL HIGH (ref 70–99)
Potassium: 3.7 mmol/L (ref 3.5–5.1)
Sodium: 142 mmol/L (ref 135–145)

## 2021-10-15 ENCOUNTER — Encounter (HOSPITAL_COMMUNITY)
Admission: RE | Admit: 2021-10-15 | Discharge: 2021-10-15 | Disposition: A | Discharge status: Home / Self Care | Payer: Medicare HMO | Source: Ambulatory Visit | Attending: Cardiology | Admitting: Cardiology

## 2021-10-15 DIAGNOSIS — Z79899 Other long term (current) drug therapy: Secondary | ICD-10-CM

## 2021-10-21 ENCOUNTER — Encounter (HOSPITAL_COMMUNITY)
Admission: RE | Disposition: A | Discharge status: Home / Self Care | Payer: Self-pay | Source: Home / Self Care | Attending: Cardiology

## 2021-10-21 ENCOUNTER — Encounter (HOSPITAL_COMMUNITY): Payer: Self-pay | Admitting: Cardiology

## 2021-10-21 ENCOUNTER — Ambulatory Visit (HOSPITAL_COMMUNITY): Payer: Medicare HMO

## 2021-10-21 ENCOUNTER — Ambulatory Visit (HOSPITAL_COMMUNITY)
Admission: RE | Admit: 2021-10-21 | Discharge: 2021-10-21 | Disposition: A | Discharge status: Home / Self Care | Payer: Medicare HMO | Attending: Cardiology | Admitting: Cardiology

## 2021-10-21 ENCOUNTER — Telehealth: Payer: Self-pay | Admitting: Physician Assistant

## 2021-10-21 ENCOUNTER — Ambulatory Visit (HOSPITAL_BASED_OUTPATIENT_CLINIC_OR_DEPARTMENT_OTHER): Payer: Medicare HMO

## 2021-10-21 DIAGNOSIS — I4819 Other persistent atrial fibrillation: Secondary | ICD-10-CM

## 2021-10-21 DIAGNOSIS — I10 Essential (primary) hypertension: Secondary | ICD-10-CM | POA: Insufficient documentation

## 2021-10-21 DIAGNOSIS — J449 Chronic obstructive pulmonary disease, unspecified: Secondary | ICD-10-CM

## 2021-10-21 DIAGNOSIS — K219 Gastro-esophageal reflux disease without esophagitis: Secondary | ICD-10-CM | POA: Diagnosis not present

## 2021-10-21 DIAGNOSIS — R600 Localized edema: Secondary | ICD-10-CM | POA: Insufficient documentation

## 2021-10-21 DIAGNOSIS — I4891 Unspecified atrial fibrillation: Secondary | ICD-10-CM

## 2021-10-21 DIAGNOSIS — I482 Chronic atrial fibrillation, unspecified: Secondary | ICD-10-CM

## 2021-10-21 DIAGNOSIS — D6859 Other primary thrombophilia: Secondary | ICD-10-CM | POA: Diagnosis not present

## 2021-10-21 DIAGNOSIS — Z7722 Contact with and (suspected) exposure to environmental tobacco smoke (acute) (chronic): Secondary | ICD-10-CM | POA: Insufficient documentation

## 2021-10-21 DIAGNOSIS — G4730 Sleep apnea, unspecified: Secondary | ICD-10-CM | POA: Diagnosis not present

## 2021-10-21 DIAGNOSIS — Z7901 Long term (current) use of anticoagulants: Secondary | ICD-10-CM | POA: Diagnosis not present

## 2021-10-21 DIAGNOSIS — I272 Pulmonary hypertension, unspecified: Secondary | ICD-10-CM | POA: Insufficient documentation

## 2021-10-21 DIAGNOSIS — R002 Palpitations: Secondary | ICD-10-CM | POA: Diagnosis not present

## 2021-10-21 DIAGNOSIS — M1990 Unspecified osteoarthritis, unspecified site: Secondary | ICD-10-CM | POA: Insufficient documentation

## 2021-10-21 DIAGNOSIS — E876 Hypokalemia: Secondary | ICD-10-CM | POA: Diagnosis not present

## 2021-10-21 DIAGNOSIS — I471 Supraventricular tachycardia: Secondary | ICD-10-CM | POA: Diagnosis not present

## 2021-10-21 HISTORY — PX: CARDIOVERSION: SHX1299

## 2021-10-21 SURGERY — CARDIOVERSION
Anesthesia: General

## 2021-10-21 MED ORDER — CHLORHEXIDINE GLUCONATE 0.12 % MT SOLN
15.0000 mL | Freq: Once | OROMUCOSAL | Status: AC
  Administered 2021-10-21: 15 mL via OROMUCOSAL

## 2021-10-21 MED ORDER — ORAL CARE MOUTH RINSE: 15.0000 mL | Freq: Once | OROMUCOSAL | Status: AC

## 2021-10-21 MED ORDER — SODIUM CHLORIDE 0.9 % IV SOLN: INTRAVENOUS | Status: DC

## 2021-10-21 MED ORDER — DILTIAZEM HCL 60 MG PO TABS: 60.0000 mg | ORAL_TABLET | Freq: Three times a day (TID) | ORAL | 1 refills | Status: DC | PRN

## 2021-10-21 MED ORDER — LACTATED RINGERS IV SOLN: INTRAVENOUS | Status: DC

## 2021-10-21 MED ORDER — PROPOFOL 10 MG/ML IV BOLUS
INTRAVENOUS | Status: DC | PRN
  Administered 2021-10-21: 60 mg via INTRAVENOUS

## 2021-10-21 MED ORDER — LIDOCAINE HCL (CARDIAC) PF 100 MG/5ML IV SOSY
PREFILLED_SYRINGE | INTRAVENOUS | Status: DC | PRN
  Administered 2021-10-21: 100 mg via INTRATRACHEAL

## 2021-10-21 NOTE — H&P (Signed)
Procedure H&P   70 yo female presents for electrical cardioversion for persistent atrial fibrillation. For full history please refer to referenced recent clinic note below. Plan for electrical cardioversion today with the assistance of anesthesiology.  Dina Rich MD   Clinical Summary Ms. Russell-Strader is a 71 y.o.female seen today for follow up of the following medical problems.      Prevsiously followed by [facility] Dr Rosemary Holms     1.PSVT - noted on prior monitor - from [place] notes has not been started on scehduled av nodal agents due to 3.1 sec asymptomatic pause     2. Afib - new diagnosis during 07/2021 ER visit -last visit changed to dilt 30mg  bid as opposed to prn - ongiong palpitations. Taking diltaizem 30mg  bid.  - compliant with eliquis     3. LE edema 11/2020 echo LVEF 60-65%, indet diastolic, mild RVE normal with mod pulm HTN.  - swelling is up and down   3. OSA screen - pending eval with pulmonary     4. COPD     5. HTN   6. Hypokalemia - K was 3 during ER visit - normal by recent labs   7. Pulmonary HTN - followed by pulmonary 11/2020 echo PASP 47 - has PFTs, sleep test pending.    Has a house outside of [facility], she is from 12/2020. Often drives down there for some time in the fall.        Past Medical History:  Diagnosis Date   Asthma     COPD (chronic obstructive pulmonary disease) (HCC)      questionable   Degeneration of intervertebral disc, site unspecified     Depression     DJD (degenerative joint disease)     Dyslipidemia     Dysrhythmia     GERD (gastroesophageal reflux disease)     Pneumonia 04/14/2013   Shortness of breath      on exertion   Sleep apnea      stopbang=4   Unspecified essential hypertension              Allergies  Allergen Reactions   Buprenorphine Hcl Anaphylaxis and Itching   Ace Inhibitors Diarrhea              Current Outpatient Medications  Medication Sig Dispense  Refill   albuterol (PROVENTIL HFA;VENTOLIN HFA) 108 (90 Base) MCG/ACT inhaler Inhale 1-2 puffs into the lungs every 6 (six) hours as needed for wheezing or shortness of breath. 1 Inhaler 6   amLODipine (NORVASC) 5 MG tablet Take 5 mg by mouth every evening.       apixaban (ELIQUIS) 5 MG TABS tablet Take 1 tablet (5 mg total) by mouth 2 (two) times daily. 180 tablet 1   atorvastatin (LIPITOR) 10 MG tablet Take 10 mg by mouth daily.       budesonide-formoterol (SYMBICORT) 80-4.5 MCG/ACT inhaler Inhale 2 puffs into the lungs 2 (two) times daily. 2 Inhaler 0   cholecalciferol (VITAMIN D3) 25 MCG (1000 UNIT) tablet Take 1,000 Units by mouth daily.       diltiazem (CARDIZEM) 30 MG tablet Take 1 tablet (30 mg total) by mouth 2 (two) times daily. MAY TAKE ADDITIONAL TABLET(30 MG) NEEDED Strength: 30 mg 270 tablet 1   furosemide (LASIX) 20 MG tablet Take 20 mg by mouth daily.        ibuprofen (ADVIL) 800 MG tablet Take 1 tablet (800 mg total) by mouth 3 (three) times daily. 30 tablet  0   losartan-hydrochlorothiazide (HYZAAR) 100-25 MG tablet TAKE 1 TABLET EVERY DAY 90 tablet 2   magnesium oxide (MAG-OX) 400 MG tablet Take 400 mg by mouth daily.       meloxicam (MOBIC) 15 MG tablet Take 15 mg by mouth at bedtime.        Multiple Vitamin (MULTIVITAMIN WITH MINERALS) TABS tablet Take 1 tablet by mouth daily.       omeprazole (PRILOSEC) 20 MG capsule Take 20 mg by mouth daily.       ondansetron (ZOFRAN) 4 MG tablet Take 1 tablet (4 mg total) by mouth daily as needed for nausea or vomiting. 30 tablet 1   oxyCODONE-acetaminophen (PERCOCET/ROXICET) 5-325 MG tablet Take 1-2 tablets by mouth every 4 (four) hours as needed for moderate pain. 30 tablet 0   potassium chloride SA (KLOR-CON M) 20 MEQ tablet Take 2 tablets (40 mEq total) by mouth daily. 180 tablet 1   Potassium Gluconate 550 MG TABS Take 550 mg by mouth daily.       triamcinolone (NASACORT) 55 MCG/ACT AERO nasal inhaler Place 1 spray into the nose daily  as needed (allergies).       zinc gluconate 50 MG tablet Take 50 mg by mouth daily.        No current facility-administered medications for this visit.             Past Surgical History:  Procedure Laterality Date   APPENDECTOMY       ECTOPIC PREGNANCY SURGERY       HERNIA REPAIR       KNEE ARTHROSCOPY   03/14/2008    Right knee   KNEE ARTHROSCOPY   03/14/2010    Left knee   TOTAL KNEE ARTHROPLASTY   11/22/2011    Procedure: TOTAL KNEE ARTHROPLASTY;  Surgeon: Drucilla Schmidt, MD;  Location: [facility];  Service: Orthopedics;  Laterality: Left;   TOTAL KNEE REVISION Left 09/23/2013    Procedure: REVISION LEFT TOTAL KNEE WITH POLY EXCHANGE UPSIZE AND PATELLA REVISION;  Surgeon: Shelda Pal, MD;  Location: [facility];  Service: Orthopedics;  Laterality: Left;   UPPER GI ENDOSCOPY N/A 04/13/2021    Procedure: UPPER GI ENDOSCOPY;  Surgeon: Luretha Murphy, MD;  Location: [facility];  Service: General;  Laterality: N/A;   XI ROBOTIC ASSISTED HIATAL HERNIA REPAIR N/A 04/13/2021    Procedure: 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 Russell-Strader reports that she has never smoked. She has been exposed to tobacco smoke. She has never used smokeless tobacco. Ms. Brenes reports current alcohol use of about 1.0 standard drink of alcohol per week.     Review of Systems CONSTITUTIONAL: No weight loss, fever, chills, weakness or fatigue.  HEENT: Eyes: No visual loss, blurred vision, double vision  or yellow sclerae.No  hearing loss, sneezing, congestion, runny nose or sore throat.  SKIN: No rash or itching.  CARDIOVASCULAR: per hpi RESPIRATORY: No shortness of breath, cough or sputum.  GASTROINTESTINAL: No anorexia, nausea, vomiting or diarrhea. No abdominal pain or blood.  GENITOURINARY: No burning on urination, no polyuria NEUROLOGICAL: No headache, dizziness, syncope, paralysis, ataxia, numbness or tingling in the extremities. No change in bowel or bladder control.  MUSCULOSKELETAL: No muscle, back pain, joint pain or stiffness.  LYMPHATICS: No enlarged nodes. No history of splenectomy.  PSYCHIATRIC: No history of depression or anxiety.  ENDOCRINOLOGIC: No reports of sweating, cold or heat intolerance. No polyuria or polydipsia.  [name] Kitchen     Physical Examination    Today's Vitals    10/12/21 0909  BP: 110/84  Pulse: 92  SpO2: 96%  Weight: 233 lb 9.6 oz (106 kg)  Height: 5\' 6"  (1.676 m)    Body mass index is 37.7 kg/m.   Gen: resting comfortably, no acute distress HEENT: no scleral icterus, pupils equal round and reactive, no palptable cervical adenopathy,  CV: irreg, no m/r/g no jvd Resp: Clear to auscultation bilaterally GI: abdomen is soft, non-tender, non-distended, normal bowel sounds, no hepatosplenomegaly MSK: extremities are warm, no edema.  Skin: warm, no rash Neuro:  no focal deficits Psych: appropriate affect     Diagnostic Studies   Long term monitor 11 days 11/28/2019 - 12/09/2019: Dominant rhythm: Sinus. HR 35-222 bpm. Avg HR 83 bpm. 595 episodes of SVT, fastest at 147 bpm for 4 min 3 sec, longest for 30 min 51 sec at 125 bpm. 6.3% SVE burden. 1 episode of ventricular tachycardia for 6 beats at 184 bpm <1% VE burden. 1 episode of asymptomatic 3.1 sec sinus pause at 2:01 PM. No atrial fibrillation/atrial flutter/high grade AV block, 1 patient triggered event, correlates with isolated SVE.    Echocardiogram 11/07/2019:  Left ventricle cavity is normal in size. Moderate  concentric hypertrophy  of the left ventricle. Normal global wall motion. Normal LV systolic  function with EF 55%. Indeterminate diastolic filling pattern.  Left atrial cavity is severely dilated.  Right atrial cavity is mildly dilated.  Trileaflet aortic valve.  Trace aortic regurgitation.  Mild (Grade I) mitral regurgitation.  Moderate tricuspid regurgitation. Estimated pulmonary artery systolic  pressure 38 mmHg.    EKG 10/14/2019: Sinus rhythm 81 bpm  Borderline left atrial enlargement Incomplete right bundle Karynn Deblasi block Occasional PAC           Assessment and Plan  1.Persistent Afib/acquired thrombophilia - new diagnosis during 07/2021 ER visit - ongoign palpitations, rates today low 100s afib by EKG. Increase diltizem to 60mg  bid - given relatively new onset of afib will atttempt DCCV to restore SR. She has not missed any doses of anticoagulation - since on dilt stop amlodopine.    2. LE edema - change lasix to 20mg  may take additional 20mg  prn           Arnoldo Lenis, M.D

## 2021-10-21 NOTE — CV Procedure (Addendum)
CV Procedure Note  Procedure: Electrical cardioversion Physician: Dr Dina Rich MD Indication: Persistent atrial fibrillation   Patient presented to the procedure suite after appropriate consent was obtained. I confirmed with patient she had not missed any doses of eliquis within the last 3 weeks. Defib pads placed in the anterior and posterior positions. Converted from afib to NSR after a single synchronized 200j shock. Cardiopulmonary monitoring performed throughout the procedure, she tolerated well without complications.  Monitor HRs in recovery, may lower her diltiazem dose prior to discharge.    Addendum; Recovery HRs 50s to 60s after DCCV, will change her diltiazem to 60mg  every 8hrs just prn palpitations   MD

## 2021-10-21 NOTE — Transfer of Care (Signed)
Immediate Anesthesia Transfer of Care Note  Patient: Kathryn Olsen  Procedure(s) Performed: CARDIOVERSION  Patient Location: PACU  Anesthesia Type:General  Level of Consciousness: awake, alert , oriented and patient cooperative  Airway & Oxygen Therapy: Patient Spontanous Breathing and Patient connected to nasal cannula oxygen  Post-op Assessment: Report given to RN, Post -op Vital signs reviewed and stable and Patient moving all extremities  Post vital signs: Reviewed and stable  Last Vitals:  Vitals Value Taken Time  BP    Temp    Pulse 60 10/21/21 0920  Resp 18 10/21/21 0920  SpO2 98 % 10/21/21 0920  Vitals shown include unvalidated device data.  Last Pain:  Vitals:   10/21/21 0748  TempSrc: Oral  PainSc: 6       Patients Stated Pain Goal: 7 (10/21/21 0748)  Complications: No notable events documented.

## 2021-10-21 NOTE — Anesthesia Preprocedure Evaluation (Signed)
Anesthesia Evaluation  Patient identified by MRN, date of birth, ID band Patient awake    Reviewed: Allergy & Precautions, NPO status , Patient's Chart, lab work & pertinent test results  History of Anesthesia Complications Negative for: history of anesthetic complications  Airway Mallampati: II  TM Distance: >3 FB Neck ROM: Full    Dental  (+) Dental Advisory Given, Missing, Chipped, Poor Dentition   Pulmonary shortness of breath and with exertion, asthma , sleep apnea , pneumonia, COPD,  COPD inhaler,    Pulmonary exam normal breath sounds clear to auscultation       Cardiovascular Exercise Tolerance: Good hypertension, Pt. on medications + dysrhythmias  Rhythm:Irregular Rate:Normal     Neuro/Psych PSYCHIATRIC DISORDERS Depression negative neurological ROS     GI/Hepatic Neg liver ROS, GERD  Medicated and Controlled,  Endo/Other  negative endocrine ROS  Renal/GU negative Renal ROS  negative genitourinary   Musculoskeletal  (+) Arthritis , Osteoarthritis,    Abdominal   Peds negative pediatric ROS (+)  Hematology negative hematology ROS (+)   Anesthesia Other Findings   Reproductive/Obstetrics negative OB ROS                             Anesthesia Physical Anesthesia Plan  ASA: 3  Anesthesia Plan: General   Post-op Pain Management: Minimal or no pain anticipated   Induction: Intravenous  PONV Risk Score and Plan: Propofol infusion  Airway Management Planned: Natural Airway and Nasal Cannula  Additional Equipment:   Intra-op Plan:   Post-operative Plan:   Informed Consent: I have reviewed the patients History and Physical, chart, labs and discussed the procedure including the risks, benefits and alternatives for the proposed anesthesia with the patient or authorized representative who has indicated his/her understanding and acceptance.     Dental advisory given  Plan  Discussed with: CRNA and Surgeon  Anesthesia Plan Comments:         Anesthesia Quick Evaluation

## 2021-10-21 NOTE — Progress Notes (Signed)
Electrical Cardioversion Procedure Note Kathryn Olsen [id number] [birth year]  Procedure: Electrical Cardioversion Indications:  Atrial Fibrillation  Procedure Details Consent: Risks of procedure as well as the alternatives and risks of each were explained to the (patient/caregiver).  Consent for procedure obtained. Time Out: Verified patient identification, verified procedure, site/side was marked, verified correct patient position, special equipment/implants available, medications/allergies/relevent history reviewed, required imaging and test results available.  Performed  Patient placed on cardiac monitor, pulse oximetry, supplemental oxygen as necessary.  Sedation given: Propofol by V, DeCarlo, CRNA2 Pacer pads placed anterior and posterior chest.  Cardioverted 1 time(s).  Cardioverted at 200J.  Evaluation Findings: Post procedure EKG shows: NSR Complications: None Patient did tolerate procedure well.   Trenton Gammon S 10/21/2021, 9:29 AM

## 2021-10-21 NOTE — Telephone Encounter (Signed)
Patient had a cardioversion this morning.  However, went into atrial fibrillation this evening.  Heart rate in the 90s.  Advised to take diltiazem 60 mg now.  She will call us in the morning with response.

## 2021-10-21 NOTE — Anesthesia Postprocedure Evaluation (Signed)
Anesthesia Post Note  Patient: Kathryn Olsen  Procedure(s) Performed: CARDIOVERSION  Patient location during evaluation: Phase II Anesthesia Type: General Level of consciousness: awake and alert and oriented Pain management: pain level controlled Vital Signs Assessment: post-procedure vital signs reviewed and stable Respiratory status: spontaneous breathing, nonlabored ventilation and respiratory function stable Cardiovascular status: blood pressure returned to baseline and stable Postop Assessment: no apparent nausea or vomiting Anesthetic complications: no   No notable events documented.   Last Vitals:  Vitals:   10/21/21 1000 10/21/21 1007  BP: (!) 137/104 (!) 155/96  Pulse: (!) 57 61  Resp: 14 18  Temp:  36.7 C  SpO2: 94% 98%    Last Pain:  Vitals:   10/21/21 1007  TempSrc: Oral  PainSc: 0-No pain                 Lory Nowaczyk C Beacher Every

## 2021-10-22 ENCOUNTER — Telehealth: Payer: Self-pay | Admitting: Cardiology

## 2021-10-22 NOTE — Telephone Encounter (Signed)
Spoke with patient - had cardioversion yesterday.  Felt like she started feeling her heart fluttering again last evening.  Checked on her cardia mobile and said she was in afib.  Does note some SOB with activity.  No chest pain.  HR running 88-110.

## 2021-10-22 NOTE — Telephone Encounter (Signed)
Patient c/o Palpitations:  High priority if patient c/o lightheadedness, shortness of breath, or chest pain  How long have you had palpitations/irregular HR/ Afib? Are you having the symptoms now? Yes  Are you currently experiencing lightheadedness, SOB or CP? SOB  Do you have a history of afib (atrial fibrillation) or irregular heart rhythm? Yes  Have you checked your BP or HR? (document readings if available):   HR in the 90's  Are you experiencing any other symptoms?   Patient called concerned about the fluttering of her heart.

## 2021-10-25 ENCOUNTER — Encounter: Payer: Self-pay | Admitting: *Deleted

## 2021-10-25 NOTE — Telephone Encounter (Signed)
Replied via mychart.

## 2021-10-25 NOTE — Telephone Encounter (Signed)
Can she come in for nursing visit with vitals and EKG to confirm. Would resume her diltiazem 60mg  bid if heart rates remain in the 100s    MD

## 2021-10-26 ENCOUNTER — Encounter (HOSPITAL_COMMUNITY): Payer: Self-pay | Admitting: Cardiology

## 2021-10-26 ENCOUNTER — Telehealth: Payer: Self-pay | Admitting: Cardiology

## 2021-10-26 NOTE — Telephone Encounter (Signed)
Spoke with pt who states that her blood has been elevated. Pt states that she got a new BP cuff and noticed her BP was high. She asked a neighbor to check it manually. She got 180/100. Pt states that she took her medicines 45 mins ago and recheck bp was 200/138 HR 84 with automatic cuff. She denies dizziness but does c/o of sinus headache. She states that she was taken off Clonidine and she is taking Cardizem as needed. Please advise.

## 2021-10-26 NOTE — Telephone Encounter (Signed)
Pt mistakenly gave the name of Clonidine. After discussion pt was taken off Amlodipine on 10/12/21 and Cardizem was increased to 60 mg BID.  She is still taking Hyzaar as prescribed.

## 2021-10-26 NOTE — Telephone Encounter (Signed)
I see no record of clonidine, is she referring to cardizem? Clarify still taking her hyzaar please. I would start taking her diltiazem 60mg  bid until she sees this week   [place] MD

## 2021-10-26 NOTE — Telephone Encounter (Signed)
Pt c/o BP issue: STAT if pt c/o blurred vision, one-sided weakness or slurred speech  1. What are your last 5 BP readings?  200/130 196/121 180/110  2. Are you having any other symptoms (ex. Dizziness, headache, blurred vision, passed out)? She said she was irritable.   3. What is your BP issue? Elevated BP

## 2021-10-28 ENCOUNTER — Encounter: Payer: Self-pay | Admitting: Cardiology

## 2021-10-28 ENCOUNTER — Ambulatory Visit (INDEPENDENT_AMBULATORY_CARE_PROVIDER_SITE_OTHER): Payer: Medicare HMO

## 2021-10-28 VITALS — BP 142/84 | HR 97 | Ht 66.0 in | Wt 233.8 lb

## 2021-10-28 DIAGNOSIS — I4819 Other persistent atrial fibrillation: Secondary | ICD-10-CM | POA: Diagnosis not present

## 2021-10-28 NOTE — Telephone Encounter (Signed)
Will see how vitals and EKG look at her nursing visit today  Dominga Ferry MD

## 2021-10-28 NOTE — Progress Notes (Signed)
ekg shows she is in normal rhythm but having a lot of extra heart beats, not back in the afib. have her take her dilitazem 60mg  tid and update Monday. If that dose works we could change it to a once a day version  Wednesday MD

## 2021-10-28 NOTE — Telephone Encounter (Signed)
Nurse visit completed- waiting on providers response.

## 2021-10-28 NOTE — Progress Notes (Signed)
Pt presents today for nurse visit- vitals/ekg pot DCCV.  Pt states that blood pressure has been elevated recently.  Pt denies n/v, cp- pt states she has occasional SOB w/ activity.   Will copy pt's home bp log and send it to provider along with EKG.

## 2021-11-07 ENCOUNTER — Ambulatory Visit
Admission: EM | Admit: 2021-11-07 | Discharge: 2021-11-07 | Disposition: A | Discharge status: Home / Self Care | Payer: Medicare HMO | Attending: Family Medicine | Admitting: Family Medicine

## 2021-11-07 ENCOUNTER — Ambulatory Visit (INDEPENDENT_AMBULATORY_CARE_PROVIDER_SITE_OTHER): Payer: Medicare HMO

## 2021-11-07 DIAGNOSIS — M25572 Pain in left ankle and joints of left foot: Secondary | ICD-10-CM | POA: Diagnosis not present

## 2021-11-07 DIAGNOSIS — W19XXXA Unspecified fall, initial encounter: Secondary | ICD-10-CM

## 2021-11-07 NOTE — ED Provider Notes (Signed)
[facility]    CSN: [id number] Arrival date & time: 11/07/21  9150      History   Chief Complaint Chief Complaint  Patient presents with   Ankle Pain    HPI Kathryn Olsen is a 71 y.o. female.   Presenting today with left lateral ankle swelling, pain, decreased range of motion since slipping and falling in her kitchen last night.  She states she is not able to bear weight on the foot due to pain and having some numb tingling sensation to her toes.  Denies complete loss of range of motion, discoloration beyond slight bruising, skin injury, other obvious injuries from the fall.  Has been trying over-the-counter pain relievers with minimal relief.    Past Medical History:  Diagnosis Date   Asthma    COPD (chronic obstructive pulmonary disease) (HCC)    questionable   Degeneration of intervertebral disc, site unspecified    Depression    DJD (degenerative joint disease)    Dyslipidemia    Dysrhythmia    GERD (gastroesophageal reflux disease)    Pneumonia 04/14/2013   Shortness of breath    on exertion   Sleep apnea    stopbang=4   Unspecified essential hypertension     Patient Active Problem List   Diagnosis Date Noted   History of repair of hiatal hernia 04/13/2021   Snoring 11/18/2020   PSVT (paroxysmal supraventricular tachycardia) (HCC) 12/19/2019   Sinus pause 12/19/2019   Pulmonary hypertension, unspecified (HCC) 11/28/2019   Irregular heart beat 10/14/2019   Dizziness 10/14/2019   Asthma, chronic 11/29/2013   Other malaise and fatigue 11/29/2013   Shortness of breath 10/28/2013   Obesity, unspecified 10/28/2013   S/P left knee revision 09/23/2013   Exertional dyspnea 08/09/2013   Preoperative cardiovascular examination 08/09/2013   Pre-operative cardiovascular examination 08/09/2013   Chest pain 08/09/2013   Dyspnea 08/09/2013   Essential hypertension 08/09/2013   Nonspecific (abnormal) findings on radiological and other examination  of gastrointestinal tract 04/26/2011   Esophageal reflux 04/26/2011   Chest pain, unspecified 04/26/2011   Special screening for malignant neoplasms, colon 04/26/2011   DYSLIPIDEMIA 09/03/2008   Malignant hypertension 09/03/2008   DEGENERATIVE DISC DISEASE 09/03/2008    Past Surgical History:  Procedure Laterality Date   APPENDECTOMY     CARDIOVERSION N/A 10/21/2021   Procedure: CARDIOVERSION;  Surgeon: Antoine Poche, MD;  Location: [facility];  Service: Endoscopy;  Laterality: N/A;   ECTOPIC PREGNANCY SURGERY     HERNIA REPAIR     KNEE ARTHROSCOPY  03/14/2008   Right knee   KNEE ARTHROSCOPY  03/14/2010   Left knee   TOTAL KNEE ARTHROPLASTY  11/22/2011   Procedure: TOTAL KNEE ARTHROPLASTY;  Surgeon: Drucilla Schmidt, MD;  Location: [facility];  Service: Orthopedics;  Laterality: Left;   TOTAL KNEE REVISION Left 09/23/2013   Procedure: REVISION LEFT TOTAL KNEE WITH POLY EXCHANGE UPSIZE AND PATELLA REVISION;  Surgeon: Shelda Pal, MD;  Location: [facility];  Service: Orthopedics;  Laterality: Left;   UPPER GI ENDOSCOPY N/A 04/13/2021   Procedure: UPPER GI ENDOSCOPY;  Surgeon: Luretha Murphy, MD;  Location: [facility];  Service: General;  Laterality: N/A;   XI ROBOTIC ASSISTED HIATAL HERNIA REPAIR N/A 04/13/2021   Procedure: ROBOTIC REPAIR OF LARGE TYPE III HIATAL HERNIA WITH NISSEN, AND GASTROPEXY;  Surgeon: Luretha Murphy, MD;  Location: [facility];  Service: General;  Laterality: N/A;    OB History     Gravida  2   Para  Term      Preterm      AB      Living         SAB      IAB      Ectopic      Multiple      Live Births               Home Medications    Prior to Admission medications   Medication Sig Start Date End Date Taking? Authorizing Provider  albuterol (VENTOLIN HFA) 108 (90 Base) MCG/ACT inhaler Inhale 1-2 puffs into the lungs every 6 (six) hours as needed for wheezing or shortness of breath.    [provider]  apixaban (ELIQUIS) 5 MG  TABS tablet Take 1 tablet (5 mg total) by mouth 2 (two) times daily. 07/30/21   Antoine Poche, MD  atorvastatin (LIPITOR) 10 MG tablet Take 10 mg by mouth daily. 11/28/17   [provider]  cholecalciferol (VITAMIN D3) 25 MCG (1000 UNIT) tablet Take 1,000 Units by mouth daily.    [provider]  diltiazem (CARDIZEM) 60 MG tablet Take 1 tablet (60 mg total) by mouth every 8 (eight) hours as needed (palpitations). 10/21/21   Strader, Lennart Pall, PA-C  furosemide (LASIX) 20 MG tablet Take 20 mg by mouth daily.  12/02/19   [provider]  losartan-hydrochlorothiazide (HYZAAR) 100-25 MG tablet TAKE 1 TABLET EVERY DAY 03/22/21   Patwardhan, Manish J, MD  magnesium oxide (MAG-OX) 400 MG tablet Take 400 mg by mouth daily.    [provider]  meloxicam (MOBIC) 15 MG tablet Take 15 mg by mouth at bedtime.     [provider]  Multiple Vitamin (MULTIVITAMIN WITH MINERALS) TABS tablet Take 1 tablet by mouth daily.    [provider]  potassium chloride SA (KLOR-CON M) 20 MEQ tablet Take 2 tablets (40 mEq total) by mouth daily. 07/23/21   Antoine Poche, MD  traMADol (ULTRAM) 50 MG tablet Take 1 tablet by mouth daily as needed for moderate pain. 09/28/21   [provider]  zinc gluconate 50 MG tablet Take 50 mg by mouth daily.    [provider]    Family History Family History  Problem Relation Age of Onset   Lung cancer Father    COPD Mother    Hypertension Mother    Melanoma Brother    Alcohol abuse Brother    Other Sister        MGUS    Social History Social History   Tobacco Use   Smoking status: Never    Passive exposure: Yes   Smokeless tobacco: Never   Tobacco comments:    both parents smoked  Vaping Use   Vaping Use: Never used  Substance Use Topics   Alcohol use: Yes    Alcohol/week: 1.0 standard drink of alcohol    Types: 1 Glasses of wine per week    Comment: occasional glass of wine   Drug use: No      Allergies   Buprenorphine hcl and Ace inhibitors   Review of Systems Review of Systems Per HPI  Physical Exam Triage Vital Signs ED Triage Vitals  Enc Vitals Group     BP 11/07/21 1153 137/89     Pulse Rate 11/07/21 1153 74     Resp 11/07/21 1153 18     Temp 11/07/21 1153 98.4 F (36.9 C)     Temp Source 11/07/21 1153 Oral  SpO2 11/07/21 1153 94 %     Weight --      Height --      Head Circumference --      Peak Flow --      Pain Score 11/07/21 1147 10     Pain Loc --      Pain Edu? --      Excl. in GC? --    No data found.  Updated Vital Signs BP 137/89 (BP Location: Right Arm)   Pulse 74   Temp 98.4 F (36.9 C) (Oral)   Resp 18   SpO2 94%   Visual Acuity Right Eye Distance:   Left Eye Distance:   Bilateral Distance:    Right Eye Near:   Left Eye Near:    Bilateral Near:     Physical Exam Vitals and nursing note reviewed.  Constitutional:      Appearance: Normal appearance. She is not ill-appearing.  HENT:     Head: Atraumatic.  Eyes:     Extraocular Movements: Extraocular movements intact.     Conjunctiva/sclera: Conjunctivae normal.  Cardiovascular:     Rate and Rhythm: Normal rate and regular rhythm.     Heart sounds: Normal heart sounds.  Pulmonary:     Effort: Pulmonary effort is normal.     Breath sounds: Normal breath sounds.  Musculoskeletal:     Cervical back: Normal range of motion and neck supple.     Comments: In wheelchair due to left ankle pain, ambulatory without assistance at baseline.  Left ankle diffusely edematous, slight bruising noted to the lateral aspect.  Tender to palpation laterally.  Range of motion intact but painful to invert the foot  Skin:    General: Skin is warm and dry.  Neurological:     Mental Status: She is alert and oriented to person, place, and time.     Comments: Slightly decreased sensation to light touch, otherwise appears vascularly intact  Psychiatric:        Mood and Affect: Mood normal.         Thought Content: Thought content normal.        Judgment: Judgment normal.      UC Treatments / Results  Labs (all labs ordered are listed, but only abnormal results are displayed) Labs Reviewed - No data to display  EKG   Radiology DG Ankle Complete Left  Result Date: 11/07/2021 CLINICAL DATA:  Left ankle pain and swelling following a fall last night. EXAM: LEFT ANKLE COMPLETE - 3+ VIEW COMPARISON:  Left foot dated 11/26/2018 FINDINGS: Diffuse soft tissue swelling. No fracture, dislocation or effusion. Stable moderate-sized inferior calcaneal enthesophyte, dorsal tarsal spurring and flattening of the normal plantar arch. IMPRESSION: 1. No fracture. 2. Stable midfoot degenerative changes and pes planus. Electronically Signed   By: Beckie Salts M.D.   On: 11/07/2021 12:10    Procedures Procedures (including critical care time)  Medications Ordered in UC Medications - No data to display  Initial Impression / Assessment and Plan / UC Course  I have reviewed the triage vital signs and the nursing notes.  Pertinent labs & imaging results that were available during my care of the patient were reviewed by me and considered in my medical decision making (see chart for details).     X-ray of the left ankle negative for acute bony abnormality.  Ace wrap applied, RICE protocol reviewed.  Return for worsening symptoms.  Final Clinical Impressions(s) / UC Diagnoses   Final diagnoses:  Acute left ankle pain   Discharge Instructions   None    ED Prescriptions   None    PDMP not reviewed this encounter.   Particia Nearing, [place] 11/07/21 1222

## 2021-11-07 NOTE — ED Triage Notes (Addendum)
Pt reports pain and swelling  in left ankle since last night after slipped and fell ceramic tiles at home. Reports left foot feels num and can not put weight on.

## 2021-11-10 ENCOUNTER — Telehealth: Payer: Self-pay

## 2021-11-10 ENCOUNTER — Telehealth: Payer: Self-pay | Admitting: Cardiology

## 2021-11-10 DIAGNOSIS — I10 Essential (primary) hypertension: Secondary | ICD-10-CM

## 2021-11-10 DIAGNOSIS — I4891 Unspecified atrial fibrillation: Secondary | ICD-10-CM

## 2021-11-10 MED ORDER — CHLORTHALIDONE 25 MG PO TABS: 25.0000 mg | ORAL_TABLET | Freq: Every day | ORAL | 1 refills | Status: DC

## 2021-11-10 MED ORDER — APIXABAN 5 MG PO TABS: 5.0000 mg | ORAL_TABLET | Freq: Two times a day (BID) | ORAL | 1 refills | Status: DC

## 2021-11-10 MED ORDER — LISINOPRIL 40 MG PO TABS: 40.0000 mg | ORAL_TABLET | Freq: Every day | ORAL | 1 refills | Status: DC

## 2021-11-10 NOTE — Telephone Encounter (Signed)
Prescription refill request for Eliquis received. Indication: AF Last office visit: 10/12/21  Dominga Ferry MD Scr: 0.81 on 10/14/21 Age:  71 Weight: 106kg  Based on above findings Eliquis 5mg  twice daily is the appropriate dose.  Refill approved.

## 2021-11-10 NOTE — Telephone Encounter (Signed)
*  STAT* If patient is at the pharmacy, call can be transferred to refill team.   1. Which medications need to be refilled? (please list name of each medication and dose if known) apixaban (ELIQUIS) 5 MG TABS tablet   2. Which pharmacy/location (including street and city if local pharmacy) is medication to be sent to? [facility] - [place], [place] - [address]   3. Do they need a 30 day or 90 day supply? 90  

## 2021-11-10 NOTE — Telephone Encounter (Signed)
Diltiazem does some bp but is mainly for the heart rates which look to be doing well. For bp would stop her lisionpril/hctz combo restart lisinopril 40mg  daily and then add chlorthalidone 25mg  daily. If heart rates ok would keep dilt where it is and work on adjusting the other bp meds to bring down bp. Needs bmet 2 weeks.   MD

## 2021-11-10 NOTE — Telephone Encounter (Signed)
Diltiazem does some bp but is mainly for the heart rates which look to be doing well. For bp would stop her lisionpril/hctz combo restart lisinopril 40mg  daily and then add chlorthalidone 25mg  daily. If heart rates ok would keep dilt where it is and work on adjusting the other bp meds to bring down bp. Needs bmet 2 weeks.    MD  Patient made aware. Verbalized understanding. Prescriptions sent to [facility] per patient request.

## 2021-11-25 ENCOUNTER — Other Ambulatory Visit (HOSPITAL_COMMUNITY): Payer: Self-pay | Admitting: Nurse Practitioner

## 2021-11-25 DIAGNOSIS — M25551 Pain in right hip: Secondary | ICD-10-CM

## 2021-12-15 ENCOUNTER — Encounter: Payer: Self-pay | Admitting: Cardiology

## 2021-12-16 NOTE — Telephone Encounter (Signed)
How are her heart rates in the afib? Is she symptomatic. If heart rates not staying elevated and she is asymptomatic does not need to go to urgent care. Does she have her diltiazem with her and has she taken any?  Kathryn Abts MD

## 2021-12-17 NOTE — Telephone Encounter (Signed)
Can try taking the diltiazem 60mg  twice daily to see if controls symptoms. Without really high heart rates or severe symptoms does not have to go to ER/urgent care, but if any changes then would.  Zandra Abts MD

## 2022-01-03 ENCOUNTER — Other Ambulatory Visit: Payer: Self-pay | Admitting: Cardiology

## 2022-01-08 ENCOUNTER — Other Ambulatory Visit: Payer: Self-pay | Admitting: Cardiology

## 2022-01-26 ENCOUNTER — Ambulatory Visit: Payer: Medicare HMO | Admitting: Student

## 2022-01-27 ENCOUNTER — Other Ambulatory Visit: Payer: Self-pay | Admitting: *Deleted

## 2022-01-27 MED ORDER — LISINOPRIL 40 MG PO TABS: 40.0000 mg | ORAL_TABLET | Freq: Every day | ORAL | 1 refills | Status: DC

## 2022-01-27 MED ORDER — CHLORTHALIDONE 25 MG PO TABS: 25.0000 mg | ORAL_TABLET | Freq: Every day | ORAL | 1 refills | Status: DC

## 2022-02-02 ENCOUNTER — Ambulatory Visit: Payer: Medicare HMO | Admitting: Student

## 2022-02-21 ENCOUNTER — Ambulatory Visit: Payer: Medicare HMO | Admitting: Cardiology

## 2022-03-27 IMAGING — MG DIGITAL DIAGNOSTIC BILAT W/ TOMO W/ CAD
8 of 16 series · 8 of 40 positions shown · non-contrast
Comparison: Previous exam(s).

CLINICAL DATA: 70-year-old female presenting for delayed follow-up
of bilateral breast calcifications.

EXAM:
DIGITAL DIAGNOSTIC BILATERAL MAMMOGRAM WITH TOMOSYNTHESIS AND CAD
TECHNIQUE: Bilateral digital diagnostic mammography and breast tomosynthesis
was performed. The images were evaluated with computer-aided
detection.

[R ML]
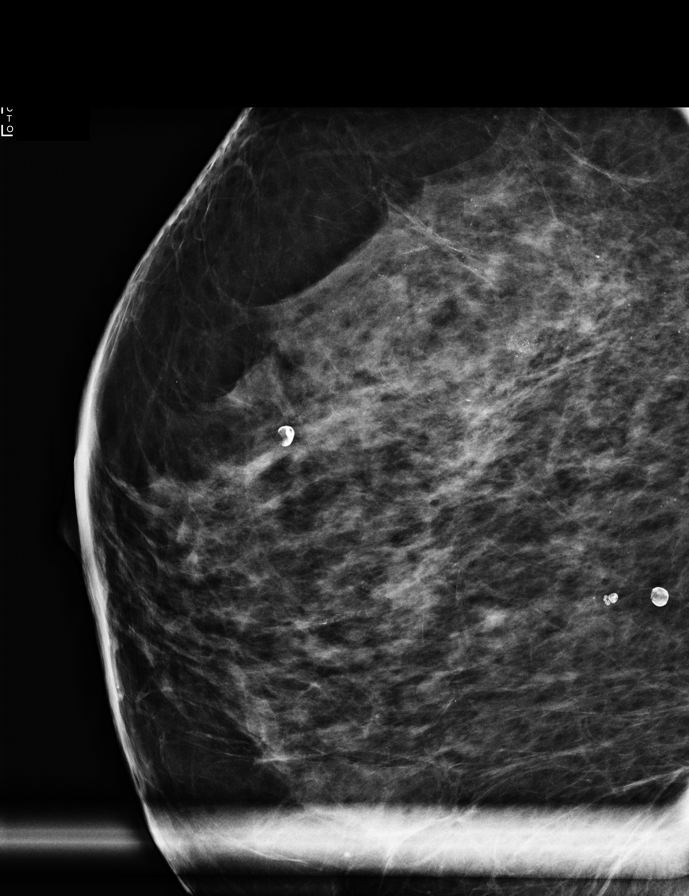

[R CC]
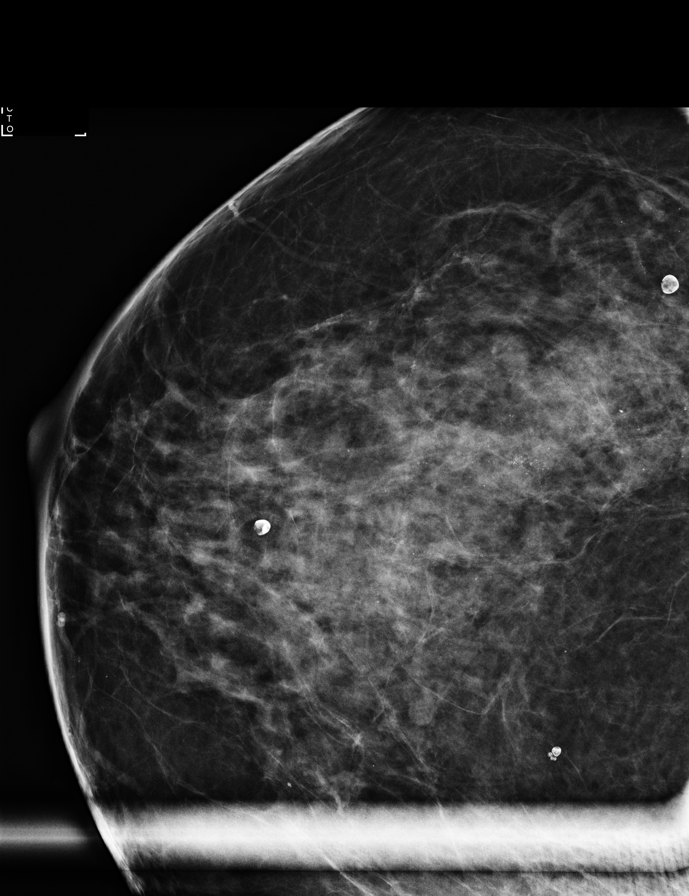

[L CC]
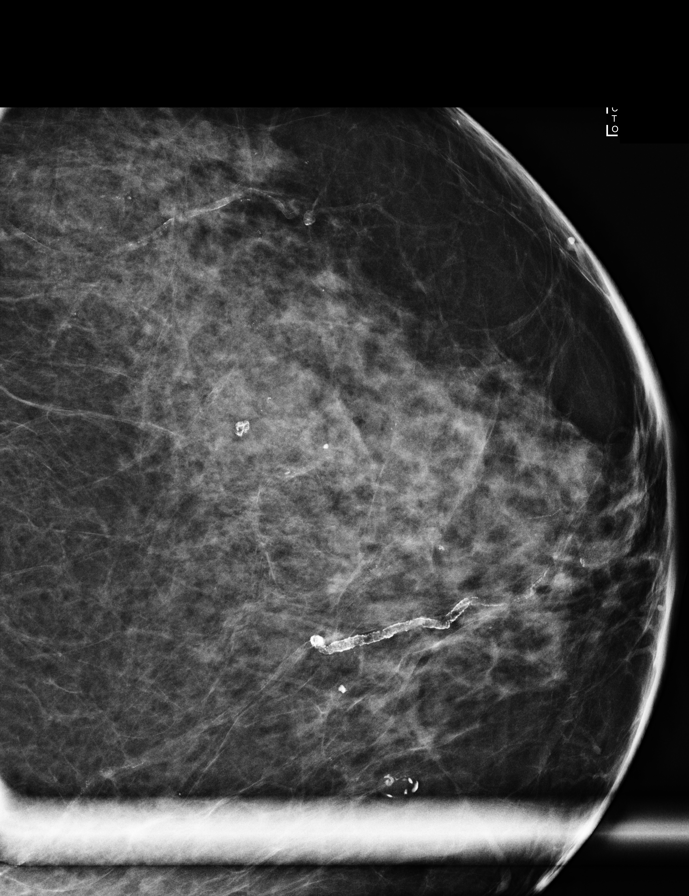

[L ML]
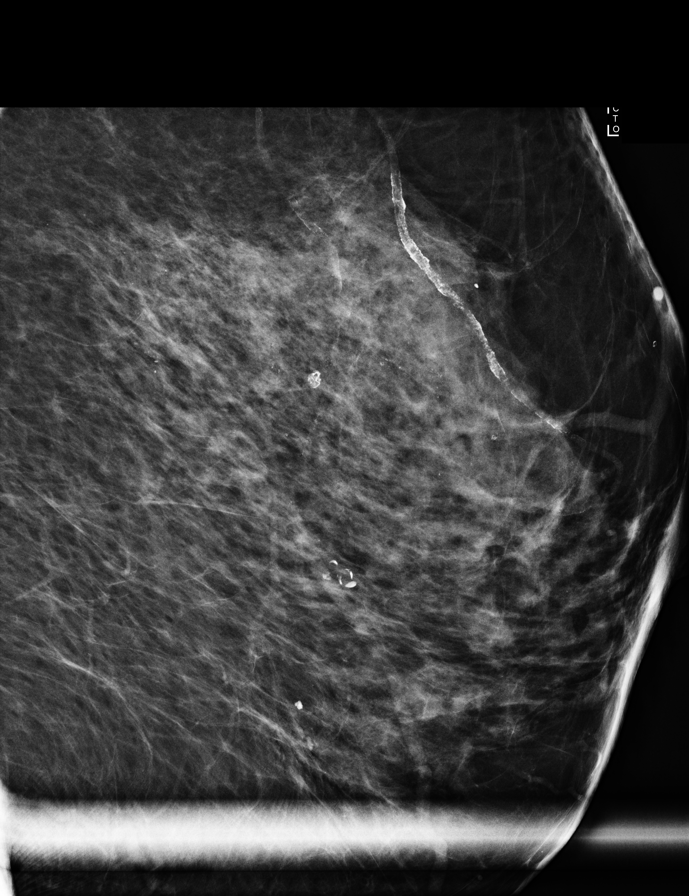

[L ML synth-2D]
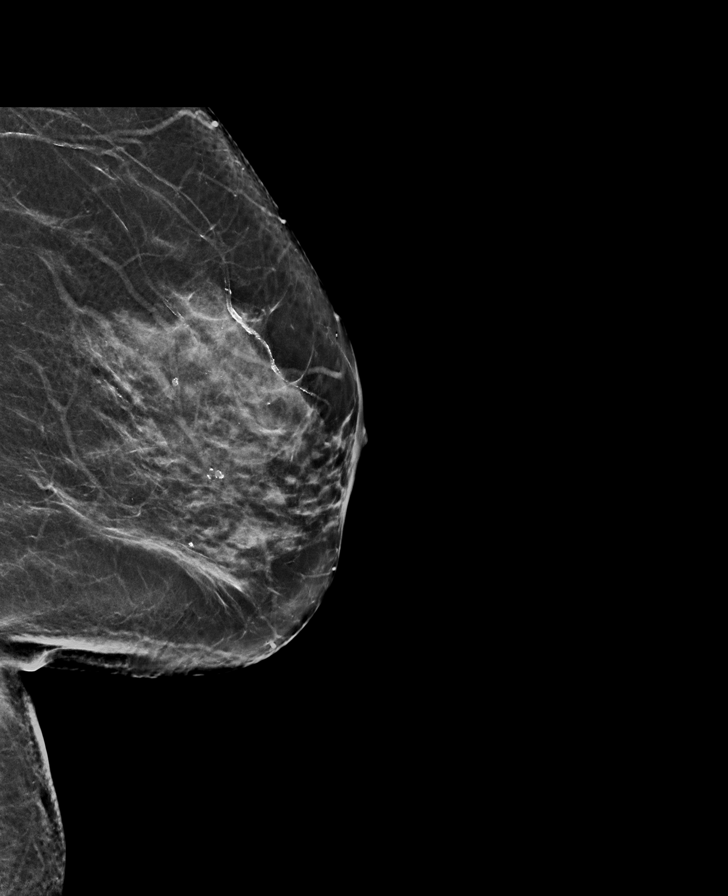

[R MLO synth-2D]
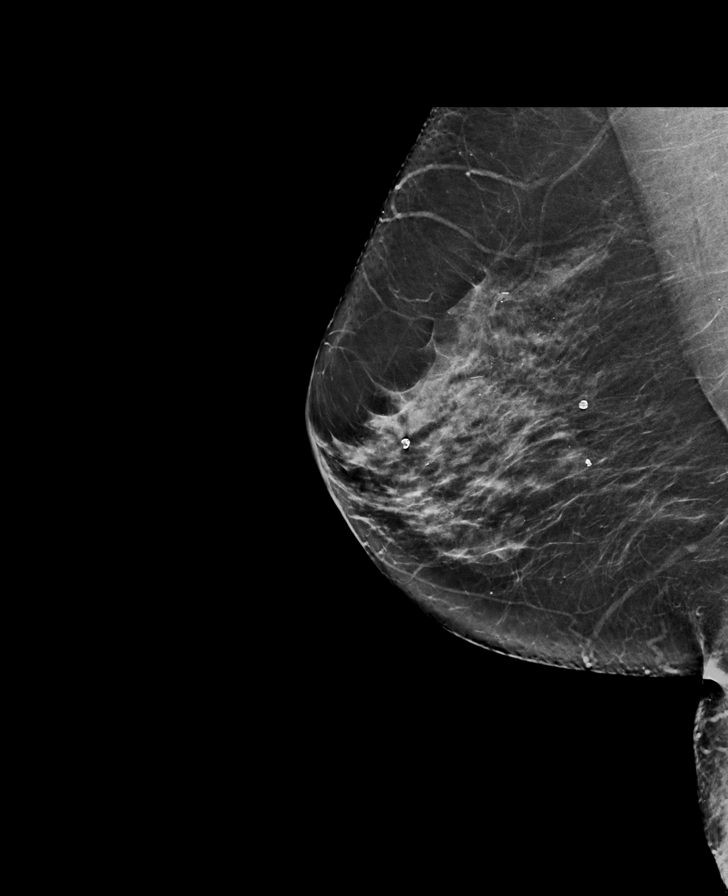

[R ML synth-2D]
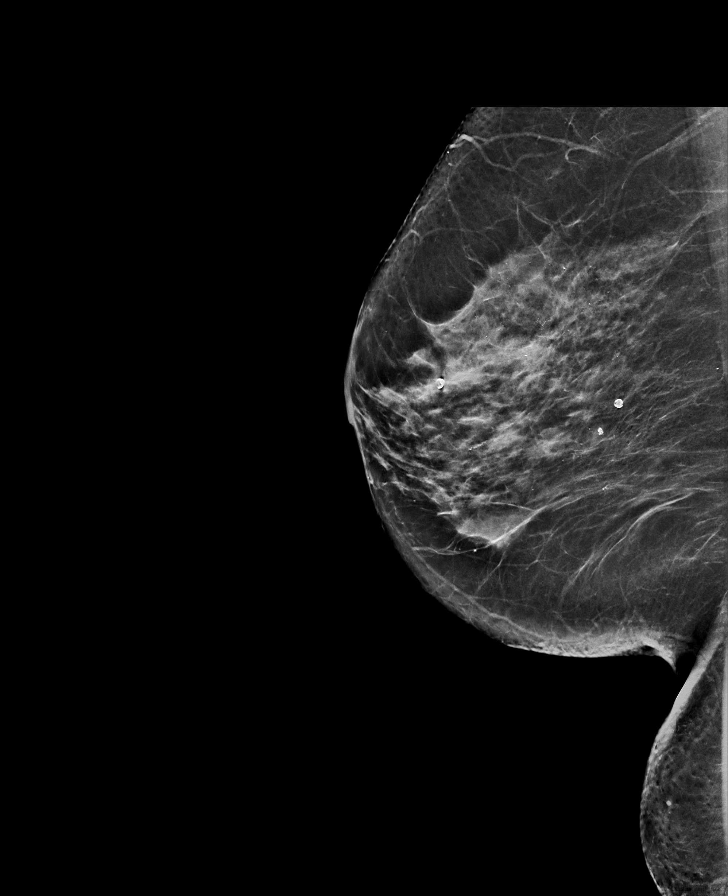

[R CC synth-2D]
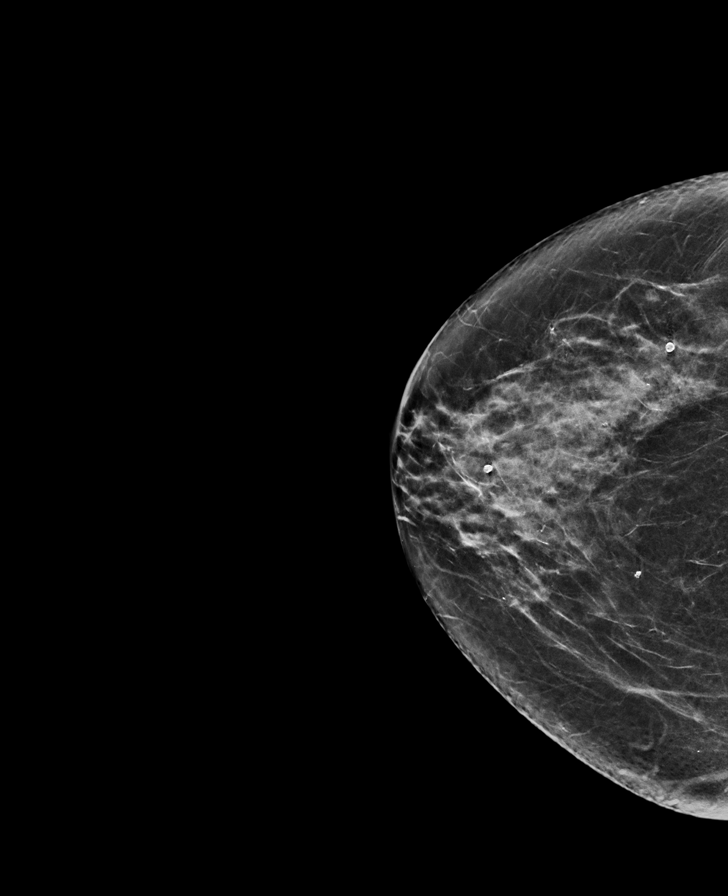

[8 of 40 positions shown; findings below may reference images not displayed]

ACR Breast Density Category c: The breast tissue is heterogeneously
dense, which may obscure small masses.
FINDINGS: Spot compression magnification images of the bilateral breast
demonstrates no significant change in the calcifications identified
on the patient's 0909 baseline screening mammogram. No suspicious
calcifications, masses or areas of distortion are seen in the
bilateral breasts.
IMPRESSION: 1.  Stable benign calcifications in the bilateral breasts.

2.  No mammographic evidence of malignancy in the bilateral breasts.

RECOMMENDATION:
Screening mammogram in one year.(Code:[id number])

I have discussed the findings and recommendations with the patient.
If applicable, a reminder letter will be sent to the patient
regarding the next appointment.

BI-RADS CATEGORY  2: Benign.

## 2022-04-07 ENCOUNTER — Ambulatory Visit: Payer: Medicare HMO | Admitting: Cardiology

## 2022-04-29 ENCOUNTER — Other Ambulatory Visit: Payer: Self-pay | Admitting: Cardiology

## 2022-05-09 NOTE — Progress Notes (Signed)
Cardiology Office Note:    Date:  05/16/2022   ID:  Kathryn Olsen, DOB 1951-02-14, MRN [id number]  PCP:  Berkley Harvey, NP  [facility] Cardiologist:  Carlyle Dolly, MD     Referring MD: Berkley Harvey, NP   Chief Complaint:  Follow-up, Shortness of Breath, Palpitations, and Atrial Fibrillation     History of Present Illness:   Kathryn Olsen is a 72 y.o. female with history of PAF status post DCCV 10/2021, PSVT, hypertension, COPD, hypertension, pulmonary hypertension followed by pulmonary.  Patient called in 12/15/21 when her Kathryn Olsen showed she was back in Afib while in [place]. She was told she could take an extra diltiazem. She went to a doctor there and was told she was back in Afib. She just got back from [place] and will be going back in the next 2 weeks. She doesn't have energy. Has to sit a lot after activity.  She accidentally was taking losartan/HCTZ and lisinopril together for a month in [place]. She is up 4 lbs today and has edema. Ate [place] food last night. Takes extra lasix prn.       Past Medical History:  Diagnosis Date   Asthma    COPD (chronic obstructive pulmonary disease) ([place])    questionable   Degeneration of intervertebral disc, site unspecified    Depression    DJD (degenerative joint disease)    Dyslipidemia    Dysrhythmia    GERD (gastroesophageal reflux disease)    Pneumonia 04/14/2013   Shortness of breath    on exertion   Sleep apnea    stopbang=4   Unspecified essential hypertension    Current Medications: Current Meds  Medication Sig   apixaban (ELIQUIS) 5 MG TABS tablet Take 1 tablet (5 mg total) by mouth 2 (two) times daily.   atorvastatin (LIPITOR) 10 MG tablet Take 10 mg by mouth daily.   chlorthalidone (HYGROTON) 25 MG tablet TAKE 1 TABLET(25 MG) BY MOUTH DAILY   cholecalciferol (VITAMIN D3) 25 MCG (1000 UNIT) tablet Take 1,000 Units by mouth daily.   diltiazem (CARDIZEM CD) 180 MG 24 hr capsule  Take 1 capsule (180 mg total) by mouth daily.   diltiazem (CARDIZEM) 60 MG tablet TAKE 1 TABLET(60 MG) BY MOUTH EVERY 8 HOURS AS NEEDED FOR PALPITATIONS   furosemide (LASIX) 20 MG tablet Take 20 mg by mouth daily.    lisinopril (ZESTRIL) 40 MG tablet TAKE 1 TABLET(40 MG) BY MOUTH DAILY   meloxicam (MOBIC) 15 MG tablet Take 15 mg by mouth at bedtime.    Multiple Vitamin (MULTIVITAMIN WITH MINERALS) TABS tablet Take 1 tablet by mouth daily.   potassium chloride SA (KLOR-CON M) 20 MEQ tablet TAKE 2 TABLETS EVERY DAY   zinc gluconate 50 MG tablet Take 50 mg by mouth daily.    Allergies:   Buprenorphine hcl and Ace inhibitors   Social History   Tobacco Use   Smoking status: Never    Passive exposure: Yes   Smokeless tobacco: Never   Tobacco comments:    both parents smoked  Vaping Use   Vaping Use: Never used  Substance Use Topics   Alcohol use: Yes    Alcohol/week: 1.0 standard drink of alcohol    Types: 1 Glasses of wine per week    Comment: occasional glass of wine   Drug use: No    Family Hx: The patient's family history includes Alcohol abuse in her brother; COPD in her mother; Hypertension in her mother;  Lung cancer in her father; Melanoma in her brother; Other in her sister.  ROS     Physical Exam:    VS:  BP 138/80   Pulse 94   Ht '5\' 5"'$  (1.651 m)   Wt 244 lb 6.4 oz (110.9 kg)   SpO2 97%   BMI 40.67 kg/m     Wt Readings from Last 3 Encounters:  05/16/22 244 lb 6.4 oz (110.9 kg)  10/28/21 233 lb 12.8 oz (106.1 kg)  10/14/21 233 lb 9.6 oz (106 kg)    Physical Exam  GEN: Obese, in no acute distress  Neck: no JVD, carotid bruits, or masses Cardiac irreg irreg, no murmurs, rubs, or gallops  Respiratory:  clear to auscultation bilaterally, normal work of breathing GI: soft, nontender, nondistended, + BS Ext: plus 1-2 edema otherwise LE without cyanosis, clubbing, Good distal pulses bilaterally Neuro:  Alert and Oriented x 3,  Psych: euthymic mood, full affect         EKGs/Labs/Other Test Reviewed:    EKG:  EKG is   ordered today.  The ekg ordered today demonstrates Afib at 86/m, IRBBB  Recent Labs: 07/20/2021: Hemoglobin 16.9; Platelets 255 09/09/2021: Magnesium 2.5 10/14/2021: BUN 19; Creatinine, Ser 0.81; Potassium 3.7; Sodium 142   Recent Lipid Panel No results for input(s): "CHOL", "TRIG", "HDL", "VLDL", "LDLCALC", "LDLDIRECT" in the last 8760 hours.   Prior CV Studies:    Echo 11/2020  IMPRESSIONS     1. Left ventricular ejection fraction, by estimation, is 60 to 65%. The  left ventricle has normal function. The left ventricle has no regional  wall motion abnormalities. Left ventricular diastolic parameters are  indeterminate.   2. Right ventricular systolic function is normal. The right ventricular  size is mildly enlarged. There is moderately elevated pulmonary artery  systolic pressure.   3. Left atrial size was mildly dilated.   4. Right atrial size was mildly dilated.   5. The mitral valve is normal in structure. No evidence of mitral valve  regurgitation. No evidence of mitral stenosis.   6. The tricuspid valve is abnormal.   7. The aortic valve is tricuspid. Aortic valve regurgitation is not  visualized. No aortic stenosis is present.   8. The inferior vena cava is normal in size with greater than 50%  respiratory variability, suggesting right atrial pressure of 3 mmHg.   Long term monitor 11 days 11/28/2019 - 12/09/2019: Dominant rhythm: Sinus. HR 35-222 bpm. Avg HR 83 bpm. 595 episodes of SVT, fastest at 147 bpm for 4 min 3 sec, longest for 30 min 51 sec at 125 bpm. 6.3% SVE burden. 1 episode of ventricular tachycardia for 6 beats at 184 bpm <1% VE burden. 1 episode of asymptomatic 3.1 sec sinus pause at 2:01 PM. No atrial fibrillation/atrial flutter/high grade AV block, 1 patient triggered event, correlates with isolated SVE.    Echocardiogram 11/07/2019:  Left ventricle cavity is normal in size. Moderate concentric  hypertrophy  of the left ventricle. Normal global wall motion. Normal LV systolic  function with EF 55%. Indeterminate diastolic filling pattern.  Left atrial cavity is severely dilated.  Right atrial cavity is mildly dilated.  Trileaflet aortic valve.  Trace aortic regurgitation.  Mild (Grade I) mitral regurgitation.  Moderate tricuspid regurgitation. Estimated pulmonary artery systolic  pressure 38 mmHg.      Risk Assessment/Calculations/Metrics:    CHA2DS2-VASc Score = 4   This indicates a 4.8% annual risk of stroke. The patient's score is based upon: CHF History:  1 HTN History: 1 Diabetes History: 0 Stroke History: 0 Vascular Disease History: 0 Age Score: 1 Gender Score: 1             ASSESSMENT & PLAN:   No problem-specific Assessment & Plan notes found for this encounter.   PAF status post DCCV 10/2021-has been back in Afib since at least Oct. Chronic DOE and fatigue. Care has been difficult with her living in [place] part time. Will refer to Afib clinic. Start dilt CD 180 mg daily. No bleeding problems on Eliquis. Check labs  History of SVT  Chronic lower extremity edema echo 11/2020 LVEF 60 to 65% indeterminate diastolic function moderate pulmonary hypertension. Edema on exam today and up 4 lbs. Will update echo, check labs including CMET, CBC, BNP, TSH  COPD-inhalers don't help  Hypertension-BP controlled. Was taking losartan/HCTZ and lisinopril and lasix together for a month. Had some dizziness. Check labs.  Pulmonary hypertension followed by pulmonary  Suspected sleep apnea- refer back to pulmonary.  Morbid obesity. Weight loss recommended. Chronic knee issues so can't exercise.               Dispo:  No follow-ups on file.   Medication Adjustments/Labs and Tests Ordered: Current medicines are reviewed at length with the patient today.  Concerns regarding medicines are outlined above.  Tests Ordered: Orders Placed This Encounter  Procedures    Comprehensive Metabolic Panel (CMET)   CBC   TSH   B Nat Peptide   Amb Referral to AFIB Clinic   Ambulatory referral to Pulmonology   ECHOCARDIOGRAM COMPLETE   Medication Changes: Meds ordered this encounter  Medications   diltiazem (CARDIZEM CD) 180 MG 24 hr capsule    Sig: Take 1 capsule (180 mg total) by mouth daily.    Dispense:  90 capsule    Refill:  3   Signed, Ermalinda Barrios, PA-C  05/16/2022 9:27 AM    [address], [place], [place]  [address] Phone: [phone]; Fax: [phone]

## 2022-05-12 ENCOUNTER — Other Ambulatory Visit: Payer: Self-pay | Admitting: Student

## 2022-05-12 ENCOUNTER — Encounter: Payer: Self-pay | Admitting: Radiology

## 2022-05-12 DIAGNOSIS — I4891 Unspecified atrial fibrillation: Secondary | ICD-10-CM

## 2022-05-16 ENCOUNTER — Encounter: Payer: Self-pay | Admitting: Physician Assistant

## 2022-05-16 ENCOUNTER — Other Ambulatory Visit
Admission: RE | Admit: 2022-05-16 | Discharge: 2022-05-16 | Disposition: A | Discharge status: Home / Self Care | Payer: Medicare HMO | Source: Ambulatory Visit | Attending: Physician Assistant | Admitting: Physician Assistant

## 2022-05-16 ENCOUNTER — Ambulatory Visit: Payer: Medicare HMO | Admitting: Physician Assistant

## 2022-05-16 VITALS — BP 138/80 | HR 94 | Ht 65.0 in | Wt 244.4 lb

## 2022-05-16 DIAGNOSIS — I4891 Unspecified atrial fibrillation: Secondary | ICD-10-CM

## 2022-05-16 DIAGNOSIS — R29818 Other symptoms and signs involving the nervous system: Secondary | ICD-10-CM

## 2022-05-16 DIAGNOSIS — I10 Essential (primary) hypertension: Secondary | ICD-10-CM | POA: Insufficient documentation

## 2022-05-16 DIAGNOSIS — I471 Supraventricular tachycardia, unspecified: Secondary | ICD-10-CM

## 2022-05-16 DIAGNOSIS — I272 Pulmonary hypertension, unspecified: Secondary | ICD-10-CM

## 2022-05-16 DIAGNOSIS — R600 Localized edema: Secondary | ICD-10-CM | POA: Insufficient documentation

## 2022-05-16 DIAGNOSIS — I509 Heart failure, unspecified: Secondary | ICD-10-CM | POA: Insufficient documentation

## 2022-05-16 LAB — CBC
HCT: 49.5 % — ABNORMAL HIGH (ref 36.0–46.0)
Hemoglobin: 16.2 g/dL — ABNORMAL HIGH (ref 12.0–15.0)
MCH: 30.1 pg (ref 26.0–34.0)
MCHC: 32.7 g/dL (ref 30.0–36.0)
MCV: 91.8 fL (ref 80.0–100.0)
Platelets: 241 10*3/uL (ref 150–400)
RBC: 5.39 MIL/uL — ABNORMAL HIGH (ref 3.87–5.11)
RDW: 12.8 % (ref 11.5–15.5)
WBC: 6.5 10*3/uL (ref 4.0–10.5)
nRBC: 0 % (ref 0.0–0.2)

## 2022-05-16 LAB — COMPREHENSIVE METABOLIC PANEL
ALT: 20 U/L (ref 0–44)
AST: 25 U/L (ref 15–41)
Albumin: 3.7 g/dL (ref 3.5–5.0)
Alkaline Phosphatase: 74 U/L (ref 38–126)
Anion gap: 10 (ref 5–15)
BUN: 23 mg/dL (ref 8–23)
CO2: 25 mmol/L (ref 22–32)
Calcium: 8.8 mg/dL — ABNORMAL LOW (ref 8.9–10.3)
Chloride: 105 mmol/L (ref 98–111)
Creatinine, Ser: 0.9 mg/dL (ref 0.44–1.00)
GFR, Estimated: >60 mL/min (ref >60)
Glucose, Bld: 116 mg/dL — ABNORMAL HIGH (ref 70–99)
Potassium: 3.5 mmol/L (ref 3.5–5.1)
Sodium: 140 mmol/L (ref 135–145)
Total Bilirubin: 0.9 mg/dL (ref 0.3–1.2)
Total Protein: 6.6 g/dL (ref 6.5–8.1)

## 2022-05-16 LAB — BRAIN NATRIURETIC PEPTIDE: B Natriuretic Peptide: 274 pg/mL — ABNORMAL HIGH (ref 0.0–100.0)

## 2022-05-16 LAB — TSH: TSH: 2.232 u[IU]/mL (ref 0.350–4.500)

## 2022-05-16 MED ORDER — DILTIAZEM HCL ER COATED BEADS 180 MG PO CP24: 180.0000 mg | ORAL_CAPSULE | Freq: Every day | ORAL | 3 refills | Status: DC

## 2022-05-16 NOTE — Patient Instructions (Signed)
Medication Instructions:   Start Cardizem 180 mg Daily   *If you need a refill on your cardiac medications before your next appointment, please call your pharmacy*   Lab Work: Your physician recommends that you return for lab work in: Today   If you have labs (blood work) drawn today and your tests are completely normal, you will receive your results only by: [place] (if you have MyChart) OR A paper copy in the mail If you have any lab test that is abnormal or we need to change your treatment, we will call you to review the results.   Testing/Procedures: Your physician has requested that you have an echocardiogram. Echocardiography is a painless test that uses sound waves to create images of your heart. It provides your doctor with information about the size and shape of your heart and how well your heart's chambers and valves are working. This procedure takes approximately one hour. There are no restrictions for this procedure. Please do NOT wear cologne, perfume, aftershave, or lotions (deodorant is allowed). Please arrive 15 minutes prior to your appointment time.    Follow-Up: At [facility], you and your health needs are our priority.  As part of our continuing mission to provide you with exceptional heart care, we have created designated Provider Care Teams.  These Care Teams include your primary Cardiologist (physician) and Advanced Practice Providers (APPs -  Physician Assistants and Nurse Practitioners) who all work together to provide you with the care you need, when you need it.  We recommend signing up for the patient portal called "MyChart".  Sign up information is provided on this After Visit Summary.  MyChart is used to connect with patients for Virtual Visits (Telemedicine).  Patients are able to view lab/test results, encounter notes, upcoming appointments, etc.  Non-urgent messages can be sent to your provider as well.   To learn more about what you can  do with MyChart, go to [email or web address].    Your next appointment:   2-3 month(s)  Provider:   Carlyle Dolly, MD    Other Instructions Thank you for choosing [facility]!

## 2022-05-16 NOTE — Addendum Note (Signed)
Addended by: Levonne Hubert on: 05/16/2022 05:06 PM   Modules accepted: Orders

## 2022-05-18 ENCOUNTER — Ambulatory Visit (HOSPITAL_COMMUNITY)
Admission: RE | Admit: 2022-05-18 | Discharge: 2022-05-18 | Disposition: A | Discharge status: Home / Self Care | Payer: Medicare HMO | Source: Ambulatory Visit | Attending: Physician Assistant | Admitting: Physician Assistant

## 2022-05-18 VITALS — BP 102/72 | HR 89 | Ht 65.0 in | Wt 238.8 lb

## 2022-05-18 DIAGNOSIS — J449 Chronic obstructive pulmonary disease, unspecified: Secondary | ICD-10-CM | POA: Diagnosis not present

## 2022-05-18 DIAGNOSIS — I4819 Other persistent atrial fibrillation: Secondary | ICD-10-CM | POA: Diagnosis not present

## 2022-05-18 DIAGNOSIS — I5032 Chronic diastolic (congestive) heart failure: Secondary | ICD-10-CM | POA: Insufficient documentation

## 2022-05-18 DIAGNOSIS — D6869 Other thrombophilia: Secondary | ICD-10-CM | POA: Diagnosis not present

## 2022-05-18 DIAGNOSIS — Z79899 Other long term (current) drug therapy: Secondary | ICD-10-CM | POA: Diagnosis not present

## 2022-05-18 DIAGNOSIS — I4510 Unspecified right bundle-branch block: Secondary | ICD-10-CM | POA: Insufficient documentation

## 2022-05-18 DIAGNOSIS — I272 Pulmonary hypertension, unspecified: Secondary | ICD-10-CM | POA: Diagnosis not present

## 2022-05-18 DIAGNOSIS — Z7901 Long term (current) use of anticoagulants: Secondary | ICD-10-CM | POA: Diagnosis not present

## 2022-05-18 DIAGNOSIS — E669 Obesity, unspecified: Secondary | ICD-10-CM | POA: Insufficient documentation

## 2022-05-18 DIAGNOSIS — Z6839 Body mass index (BMI) 39.0-39.9, adult: Secondary | ICD-10-CM | POA: Insufficient documentation

## 2022-05-18 DIAGNOSIS — I110 Hypertensive heart disease with heart failure: Secondary | ICD-10-CM | POA: Diagnosis not present

## 2022-05-18 MED ORDER — AMIODARONE HCL 200 MG PO TABS: ORAL_TABLET | ORAL | 0 refills | Status: DC

## 2022-05-18 NOTE — Progress Notes (Signed)
Primary Care Physician: Berkley Harvey, NP Primary Cardiologist: Dr Carlyle Dolly Primary Electrophysiologist: none Referring Physician: Ermalinda Barrios PA   Kathryn Olsen is a 72 y.o. female with a history of HTN, chronic diastolic CHF, COPD, HTN, pulmonary HTN, atrial fibrillation who presents for consultation in the [facility].  The patient was initially diagnosed with atrial fibrillation 07/2021 after presenting to the ED from her PCP office with symptoms of chest discomfort. She was rate controlled and underwent outpatient DCCV on 10/21/21. Patient is on Eliquis for a CHADS2VASC score of 4.   She was seen 05/16/22 and found to be back in afib. Patient reports that her Jodelle Red mobile alerted her she was back in afib starting 12/2021. She was started on diltiazem and referred to the AF clinic to discuss options.   Today, patient remains in afib with symptoms of fatigue on exertion. She states that since 12/2021, her Jodelle Red has only shown afib. There were no specific triggers that she could identify.   Today, she denies symptoms of palpitations, chest pain, shortness of breath, orthopnea, PND, lower extremity edema, dizziness, presyncope, syncope, snoring, daytime somnolence, bleeding, or neurologic sequela. The patient is tolerating medications without difficulties and is otherwise without complaint today.    Atrial Fibrillation Risk Factors:  she does have symptoms or diagnosis of sleep apnea. she does not have a history of rheumatic fever.   she has a BMI of Body mass index is 39.74 kg/m.[name] Kitchen Filed Weights   05/18/22 1014  Weight: 108.3 kg    Family History  Problem Relation Age of Onset   Lung cancer Father    COPD Mother    Hypertension Mother    Melanoma Brother    Alcohol abuse Brother    Other Sister        MGUS     Atrial Fibrillation Management history:  Previous antiarrhythmic drugs: none Previous cardioversions:  10/2021 Previous ablations: none CHADS2VASC score: 4 Anticoagulation history: Eliquis   Past Medical History:  Diagnosis Date   Asthma    COPD (chronic obstructive pulmonary disease) ([place])    questionable   Degeneration of intervertebral disc, site unspecified    Depression    DJD (degenerative joint disease)    Dyslipidemia    Dysrhythmia    GERD (gastroesophageal reflux disease)    Pneumonia 04/14/2013   Shortness of breath    on exertion   Sleep apnea    stopbang=4   Unspecified essential hypertension    Past Surgical History:  Procedure Laterality Date   APPENDECTOMY     CARDIOVERSION N/A 10/21/2021   Procedure: CARDIOVERSION;  Surgeon: Arnoldo Lenis, MD;  Location: [facility];  Service: Endoscopy;  Laterality: N/A;   ECTOPIC PREGNANCY SURGERY     HERNIA REPAIR     KNEE ARTHROSCOPY  03/14/2008   Right knee   KNEE ARTHROSCOPY  03/14/2010   Left knee   TOTAL KNEE ARTHROPLASTY  11/22/2011   Procedure: TOTAL KNEE ARTHROPLASTY;  Surgeon: Magnus Sinning, MD;  Location: [facility];  Service: Orthopedics;  Laterality: Left;   TOTAL KNEE REVISION Left 09/23/2013   Procedure: REVISION LEFT TOTAL KNEE WITH POLY EXCHANGE UPSIZE AND PATELLA REVISION;  Surgeon: Mauri Pole, MD;  Location: [facility];  Service: Orthopedics;  Laterality: Left;   UPPER GI ENDOSCOPY N/A 04/13/2021   Procedure: UPPER GI ENDOSCOPY;  Surgeon: Johnathan Hausen, MD;  Location: [facility];  Service: General;  Laterality: N/A;   XI ROBOTIC ASSISTED HIATAL  HERNIA REPAIR N/A 04/13/2021   Procedure: 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 180 tablet 1   atorvastatin (LIPITOR) 10 MG tablet Take 10 mg by mouth daily.     chlorthalidone  (HYGROTON) 25 MG tablet TAKE 1 TABLET(25 MG) BY MOUTH DAILY 90 tablet 0   cholecalciferol (VITAMIN D3) 25 MCG (1000 UNIT) tablet Take 1,000 Units by mouth daily.     diltiazem (CARDIZEM) 60 MG tablet TAKE 1 TABLET(60 MG) BY MOUTH EVERY 8 HOURS AS NEEDED FOR PALPITATIONS 270 tablet 0   furosemide (LASIX) 20 MG tablet Take 20 mg by mouth daily.      lisinopril (ZESTRIL) 40 MG tablet TAKE 1 TABLET(40 MG) BY MOUTH DAILY 90 tablet 0   magnesium oxide (MAG-OX) 400 MG tablet Take 400 mg by mouth daily.     meloxicam (MOBIC) 15 MG tablet Take 15 mg by mouth at bedtime.      Multiple Vitamin (MULTIVITAMIN WITH MINERALS) TABS tablet Take 1 tablet by mouth daily.     potassium chloride SA (KLOR-CON M) 20 MEQ tablet TAKE 2 TABLETS EVERY DAY 180 tablet 0   traMADol (ULTRAM) 50 MG tablet Take 1 tablet by mouth daily as needed for moderate pain.     zinc gluconate 50 MG tablet Take 50 mg by mouth daily.     diltiazem (CARDIZEM CD) 180 MG 24 hr capsule Take 1 capsule (180 mg total) by mouth daily. (Patient not taking: Reported on 05/18/2022) 90 capsule 3   No current facility-administered medications for this encounter.    Allergies  Allergen Reactions   Buprenorphine Hcl Anaphylaxis and Itching   Ace Inhibitors Diarrhea    Social History   Socioeconomic History   Marital status: Married    Spouse name: Not on file   Number of children: 2   Years of education: Not on file   Highest education level: Not on file  Occupational History   Occupation: Nurse    Employer: [employer]  Tobacco Use   Smoking status: Never    Passive exposure: Yes   Smokeless tobacco: Never   Tobacco comments:    both parents smoked  Vaping Use   Vaping Use: Never used  Substance and Sexual Activity   Alcohol use: Yes    Alcohol/week: 1.0 standard drink of alcohol    Types: 1 Glasses of wine per week    Comment: occasional glass of wine   Drug use: No   Sexual activity: Not Currently    Birth control/protection:  Post-menopausal  Other Topics Concern   Not on file  Social History Narrative   Not on file   Social Determinants of Health   Financial Resource Strain: Low Risk  (02/12/2021)   Overall Financial Resource Strain (CARDIA)    Difficulty of Paying Living Expenses: Not hard at all  Food Insecurity: No Food Insecurity (02/12/2021)   Hunger Vital Sign    Worried About Running Out of Food in the Last Year: Never true    Ran Out of Food in the Last Year: Never true  Transportation Needs: No Transportation Needs (02/12/2021)   PRAPARE - Transportation    Lack of Transportation (Medical): No    Lack  of Transportation (Non-Medical): No  Physical Activity: Insufficiently Active (02/12/2021)   Exercise Vital Sign    Days of Exercise per Week: 5 days    Minutes of Exercise per Session: 20 min  Stress: Stress Concern Present (02/12/2021)   [place]    Feeling of Stress : To some extent  Social Connections: Moderately Integrated (02/12/2021)   Social Connection and Isolation Panel [NHANES]    Frequency of Communication with Friends and Family: More than three times a week    Frequency of Social Gatherings with Friends and Family: Three times a week    Attends Religious Services: Never    Active Member of Clubs or Organizations: Yes    Attends Archivist Meetings: More than 4 times per year    Marital Status: Married  Human resources officer Violence: Not At Risk (02/12/2021)   Humiliation, Afraid, Rape, and Kick questionnaire    Fear of Current or Ex-Partner: No    Emotionally Abused: No    Physically Abused: No    Sexually Abused: No     ROS- All systems are reviewed and negative except as per the HPI above.  Physical Exam: Vitals:   05/18/22 1014  BP: 102/72  Pulse: 89  Weight: 108.3 kg  Height: '5\' 5"'$  (1.651 m)    GEN- The patient is a well appearing obese female, alert and oriented x 3 today.   Head-  normocephalic, atraumatic Eyes-  Sclera clear, conjunctiva pink Ears- hearing intact Oropharynx- clear Neck- supple  Lungs- Clear to ausculation bilaterally, normal work of breathing Heart- irregular rate and rhythm, no murmurs, rubs or gallops  GI- soft, NT, ND, + BS Extremities- no clubbing, cyanosis, or edema MS- no significant deformity or atrophy Skin- no rash or lesion Psych- euthymic mood, full affect Neuro- strength and sensation are intact  Wt Readings from Last 3 Encounters:  05/18/22 108.3 kg  05/16/22 110.9 kg  10/28/21 106.1 kg    EKG today demonstrates  Afib, LAFB, inc RBBB Vent. rate 89 BPM PR interval * ms QRS duration 96 ms QT/QTcB 362/440 ms  Echo 12/04/20 demonstrated   1. Left ventricular ejection fraction, by estimation, is 60 to 65%. The  left ventricle has normal function. The left ventricle has no regional  wall motion abnormalities. Left ventricular diastolic parameters are  indeterminate.   2. Right ventricular systolic function is normal. The right ventricular  size is mildly enlarged. There is moderately elevated pulmonary artery  systolic pressure.   3. Left atrial size was mildly dilated.   4. Right atrial size was mildly dilated.   5. The mitral valve is normal in structure. No evidence of mitral valve  regurgitation. No evidence of mitral stenosis.   6. The tricuspid valve is abnormal.   7. The aortic valve is tricuspid. Aortic valve regurgitation is not  visualized. No aortic stenosis is present.   8. The inferior vena cava is normal in size with greater than 50%  respiratory variability, suggesting right atrial pressure of 3 mmHg.   Epic records are reviewed at length today  CHA2DS2-VASc Score = 4  The patient's score is based upon: CHF History: 1 HTN History: 1 Diabetes History: 0 Stroke History: 0 Vascular Disease History: 0 Age Score: 1 Gender Score: 1       ASSESSMENT AND PLAN: 1. Persistent Atrial Fibrillation (ICD10:   I48.19) The patient's CHA2DS2-VASc score is 4, indicating a 4.8% annual risk of stroke.  We discussed rhythm control options today. Would like to avoid class IC with incomplete RBBB and LAFB on ECG. Can consider Multaq, dofetilide, amiodarone, or ablation. Patient would like to be considered for ablation, will refer to EP. She does not want to be admitted for dofetilide. Will start amiodarone 200 mg BID as a bridge to ablation. Recent lab work reviewed. Will also arrange for DCCV after amiodarone loading.  Continue diltiazem 180 mg daily with 60 mg BID PRN for heart racing. Continue Eliquis 5 mg BID  2. Secondary Hypercoagulable State (ICD10:  D68.69) The patient is at significant risk for stroke/thromboembolism based upon her CHA2DS2-VASc Score of 4.  Continue Apixaban (Eliquis).   3. Obesity Body mass index is 39.74 kg/m. Lifestyle modification was discussed at length including regular exercise and weight reduction.  4. Suspected obstructive sleep apnea The importance of adequate treatment of sleep apnea was discussed today in order to improve our ability to maintain sinus rhythm long term. Patient has visit upcoming with Dr Elsworth Soho to discuss.   5. HTN Stable, no changes today.  6. Chronic HFpEF Repeat echo scheduled. Fluid status appears stable today.   Follow up in the AF clinic in 2 weeks.    [facility] [address] [place], [place] [address] [phone] 05/18/2022 10:42 AM

## 2022-05-18 NOTE — H&P (View-Only) (Signed)
  Primary Care Physician: Jones, Penny L, NP Primary Cardiologist: Dr Jonathan Branch Primary Electrophysiologist: none Referring Physician: Michele Lenze PA   Kathryn Olsen is a 71 y.o. female with a history of HTN, chronic diastolic CHF, COPD, HTN, pulmonary HTN, atrial fibrillation who presents for consultation in the [facility].  The patient was initially diagnosed with atrial fibrillation 07/2021 after presenting to the ED from her PCP office with symptoms of chest discomfort. She was rate controlled and underwent outpatient DCCV on 10/21/21. Patient is on Eliquis for a CHADS2VASC score of 4.   She was seen 05/16/22 and found to be back in afib. Patient reports that her Kardia mobile alerted her she was back in afib starting 12/2021. She was started on diltiazem and referred to the AF clinic to discuss options.   Today, patient remains in afib with symptoms of fatigue on exertion. She states that since 12/2021, her Kardia has only shown afib. There were no specific triggers that she could identify.   Today, she denies symptoms of palpitations, chest pain, shortness of breath, orthopnea, PND, lower extremity edema, dizziness, presyncope, syncope, snoring, daytime somnolence, bleeding, or neurologic sequela. The patient is tolerating medications without difficulties and is otherwise without complaint today.    Atrial Fibrillation Risk Factors:  she does have symptoms or diagnosis of sleep apnea. she does not have a history of rheumatic fever.   she has a BMI of Body mass index is 39.74 kg/m.. Filed Weights   05/18/22 1014  Weight: 108.3 kg    Family History  Problem Relation Age of Onset   Lung cancer Father    COPD Mother    Hypertension Mother    Melanoma Brother    Alcohol abuse Brother    Other Sister        MGUS     Atrial Fibrillation Management history:  Previous antiarrhythmic drugs: none Previous cardioversions:  10/2021 Previous ablations: none CHADS2VASC score: 4 Anticoagulation history: Eliquis   Past Medical History:  Diagnosis Date   Asthma    COPD (chronic obstructive pulmonary disease) ([facility])    questionable   Degeneration of intervertebral disc, site unspecified    Depression    DJD (degenerative joint disease)    Dyslipidemia    Dysrhythmia    GERD (gastroesophageal reflux disease)    Pneumonia 04/14/2013   Shortness of breath    on exertion   Sleep apnea    stopbang=4   Unspecified essential hypertension    Past Surgical History:  Procedure Laterality Date   APPENDECTOMY     CARDIOVERSION N/A 10/21/2021   Procedure: CARDIOVERSION;  Surgeon: Branch, Jonathan F, MD;  Location: [facility];  Service: Endoscopy;  Laterality: N/A;   ECTOPIC PREGNANCY SURGERY     HERNIA REPAIR     KNEE ARTHROSCOPY  03/14/2008   Right knee   KNEE ARTHROSCOPY  03/14/2010   Left knee   TOTAL KNEE ARTHROPLASTY  11/22/2011   Procedure: TOTAL KNEE ARTHROPLASTY;  Surgeon: James P Aplington, MD;  Location: [facility];  Service: Orthopedics;  Laterality: Left;   TOTAL KNEE REVISION Left 09/23/2013   Procedure: REVISION LEFT TOTAL KNEE WITH POLY EXCHANGE UPSIZE AND PATELLA REVISION;  Surgeon: Matthew D Olin, MD;  [facility];  Service: Orthopedics;  Laterality: Left;   UPPER GI ENDOSCOPY N/A 04/13/2021   Procedure: UPPER GI ENDOSCOPY;  Surgeon: Martin, Matthew, MD;  Location: [facility];  Service: General;  Laterality: N/A;   XI ROBOTIC ASSISTED HIATAL   HERNIA REPAIR N/A 04/13/2021   Procedure: 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 180 tablet 1   atorvastatin (LIPITOR) 10 MG tablet Take 10 mg by mouth daily.     chlorthalidone  (HYGROTON) 25 MG tablet TAKE 1 TABLET(25 MG) BY MOUTH DAILY 90 tablet 0   cholecalciferol (VITAMIN D3) 25 MCG (1000 UNIT) tablet Take 1,000 Units by mouth daily.     diltiazem (CARDIZEM) 60 MG tablet TAKE 1 TABLET(60 MG) BY MOUTH EVERY 8 HOURS AS NEEDED FOR PALPITATIONS 270 tablet 0   furosemide (LASIX) 20 MG tablet Take 20 mg by mouth daily.      lisinopril (ZESTRIL) 40 MG tablet TAKE 1 TABLET(40 MG) BY MOUTH DAILY 90 tablet 0   magnesium oxide (MAG-OX) 400 MG tablet Take 400 mg by mouth daily.     meloxicam (MOBIC) 15 MG tablet Take 15 mg by mouth at bedtime.      Multiple Vitamin (MULTIVITAMIN WITH MINERALS) TABS tablet Take 1 tablet by mouth daily.     potassium chloride SA (KLOR-CON M) 20 MEQ tablet TAKE 2 TABLETS EVERY DAY 180 tablet 0   traMADol (ULTRAM) 50 MG tablet Take 1 tablet by mouth daily as needed for moderate pain.     zinc gluconate 50 MG tablet Take 50 mg by mouth daily.     diltiazem (CARDIZEM CD) 180 MG 24 hr capsule Take 1 capsule (180 mg total) by mouth daily. (Patient not taking: Reported on 05/18/2022) 90 capsule 3   No current facility-administered medications for this encounter.    Allergies  Allergen Reactions   Buprenorphine Hcl Anaphylaxis and Itching   Ace Inhibitors Diarrhea    Social History   Socioeconomic History   Marital status: Married    Spouse name: Not on file   Number of children: 2   Years of education: Not on file   Highest education level: Not on file  Occupational History   Occupation: Nurse    Employer: [employer]  Tobacco Use   Smoking status: Never    Passive exposure: Yes   Smokeless tobacco: Never   Tobacco comments:    both parents smoked  Vaping Use   Vaping Use: Never used  Substance and Sexual Activity   Alcohol use: Yes    Alcohol/week: 1.0 standard drink of alcohol    Types: 1 Glasses of wine per week    Comment: occasional glass of wine   Drug use: No   Sexual activity: Not Currently    Birth control/protection:  Post-menopausal  Other Topics Concern   Not on file  Social History Narrative   Not on file   Social Determinants of Health   Financial Resource Strain: Low Risk  (02/12/2021)   Overall Financial Resource Strain (CARDIA)    Difficulty of Paying Living Expenses: Not hard at all  Food Insecurity: No Food Insecurity (02/12/2021)   Hunger Vital Sign    Worried About Running Out of Food in the Last Year: Never true    Ran Out of Food in the Last Year: Never true  Transportation Needs: No Transportation Needs (02/12/2021)   PRAPARE - Transportation    Lack of Transportation (Medical): No    Lack   of Transportation (Non-Medical): No  Physical Activity: Insufficiently Active (02/12/2021)   Exercise Vital Sign    Days of Exercise per Week: 5 days    Minutes of Exercise per Session: 20 min  Stress: Stress Concern Present (02/12/2021)   [employer] - Occupational Stress Questionnaire    Feeling of Stress : To some extent  Social Connections: Moderately Integrated (02/12/2021)   Social Connection and Isolation Panel [NHANES]    Frequency of Communication with Friends and Family: More than three times a week    Frequency of Social Gatherings with Friends and Family: Three times a week    Attends Religious Services: Never    Active Member of Clubs or Organizations: Yes    Attends Club or Organization Meetings: More than 4 times per year    Marital Status: Married  Intimate Partner Violence: Not At Risk (02/12/2021)   Humiliation, Afraid, Rape, and Kick questionnaire    Fear of Current or Ex-Partner: No    Emotionally Abused: No    Physically Abused: No    Sexually Abused: No     ROS- All systems are reviewed and negative except as per the HPI above.  Physical Exam: Vitals:   05/18/22 1014  BP: 102/72  Pulse: 89  Weight: 108.3 kg  Height: 5' 5" (1.651 m)    GEN- The patient is a well appearing obese female, alert and oriented x 3 today.   Head-  normocephalic, atraumatic Eyes-  Sclera clear, conjunctiva pink Ears- hearing intact Oropharynx- clear Neck- supple  Lungs- Clear to ausculation bilaterally, normal work of breathing Heart- irregular rate and rhythm, no murmurs, rubs or gallops  GI- soft, NT, ND, + BS Extremities- no clubbing, cyanosis, or edema MS- no significant deformity or atrophy Skin- no rash or lesion Psych- euthymic mood, full affect Neuro- strength and sensation are intact  Wt Readings from Last 3 Encounters:  05/18/22 108.3 kg  05/16/22 110.9 kg  10/28/21 106.1 kg    EKG today demonstrates  Afib, LAFB, inc RBBB Vent. rate 89 BPM PR interval * ms QRS duration 96 ms QT/QTcB 362/440 ms  Echo 12/04/20 demonstrated   1. Left ventricular ejection fraction, by estimation, is 60 to 65%. The  left ventricle has normal function. The left ventricle has no regional  wall motion abnormalities. Left ventricular diastolic parameters are  indeterminate.   2. Right ventricular systolic function is normal. The right ventricular  size is mildly enlarged. There is moderately elevated pulmonary artery  systolic pressure.   3. Left atrial size was mildly dilated.   4. Right atrial size was mildly dilated.   5. The mitral valve is normal in structure. No evidence of mitral valve  regurgitation. No evidence of mitral stenosis.   6. The tricuspid valve is abnormal.   7. The aortic valve is tricuspid. Aortic valve regurgitation is not  visualized. No aortic stenosis is present.   8. The inferior vena cava is normal in size with greater than 50%  respiratory variability, suggesting right atrial pressure of 3 mmHg.   Epic records are reviewed at length today  CHA2DS2-VASc Score = 4  The patient's score is based upon: CHF History: 1 HTN History: 1 Diabetes History: 0 Stroke History: 0 Vascular Disease History: 0 Age Score: 1 Gender Score: 1       ASSESSMENT AND PLAN: 1. Persistent Atrial Fibrillation (ICD10:   I48.19) The patient's CHA2DS2-VASc score is 4, indicating a 4.8% annual risk of stroke.     We discussed rhythm control options today. Would like to avoid class IC with incomplete RBBB and LAFB on ECG. Can consider Multaq, dofetilide, amiodarone, or ablation. Patient would like to be considered for ablation, will refer to EP. She does not want to be admitted for dofetilide. Will start amiodarone 200 mg BID as a bridge to ablation. Recent lab work reviewed. Will also arrange for DCCV after amiodarone loading.  Continue diltiazem 180 mg daily with 60 mg BID PRN for heart racing. Continue Eliquis 5 mg BID  2. Secondary Hypercoagulable State (ICD10:  D68.69) The patient is at significant risk for stroke/thromboembolism based upon her CHA2DS2-VASc Score of 4.  Continue Apixaban (Eliquis).   3. Obesity Body mass index is 39.74 kg/m. Lifestyle modification was discussed at length including regular exercise and weight reduction.  4. Suspected obstructive sleep apnea The importance of adequate treatment of sleep apnea was discussed today in order to improve our ability to maintain sinus rhythm long term. Patient has visit upcoming with Dr Alva to discuss.   5. HTN Stable, no changes today.  6. Chronic HFpEF Repeat echo scheduled. Fluid status appears stable today.   Follow up in the AF clinic in 2 weeks.    Ricky Kelley Polinsky PA-C [facility] [address] [place], [place] [address] [phone] 05/18/2022 10:42 AM  

## 2022-05-18 NOTE — Patient Instructions (Signed)
Start amiodarone '200mg'$  -- take 1 tablet by mouth twice a day for 1 month then we will reduce to once a day WITH FOOD   Cardioversion scheduled for: Thursday, March 21st   - Arrive at the [facility] and go to admitting at    [facility] not eat or drink anything after midnight the night prior to your procedure.   - Take all your morning medication (except diabetic medications) with a sip of water prior to arrival.  - You will not be able to drive home after your procedure.    - Do NOT miss any doses of your blood thinner - if you should miss a dose please notify our office immediately.   - If you feel as if you go back into normal rhythm prior to scheduled cardioversion, please notify our office immediately.   If your procedure is canceled in the cardioversion suite you will be charged a cancellation fee.

## 2022-05-26 ENCOUNTER — Ambulatory Visit (HOSPITAL_COMMUNITY)
Admission: RE | Admit: 2022-05-26 | Discharge: 2022-05-26 | Disposition: A | Discharge status: Home / Self Care | Payer: Medicare HMO | Source: Ambulatory Visit | Attending: Physician Assistant | Admitting: Physician Assistant

## 2022-05-26 ENCOUNTER — Institutional Professional Consult (permissible substitution): Payer: Medicare HMO | Admitting: Cardiovascular Disease

## 2022-05-26 VITALS — HR 80

## 2022-05-26 DIAGNOSIS — I4819 Other persistent atrial fibrillation: Secondary | ICD-10-CM | POA: Insufficient documentation

## 2022-05-26 LAB — BASIC METABOLIC PANEL
Anion gap: 15 (ref 5–15)
BUN: 19 mg/dL (ref 8–23)
CO2: 26 mmol/L (ref 22–32)
Calcium: 9.5 mg/dL (ref 8.9–10.3)
Chloride: 100 mmol/L (ref 98–111)
Creatinine, Ser: 0.98 mg/dL (ref 0.44–1.00)
GFR, Estimated: >60 mL/min (ref >60)
Glucose, Bld: 93 mg/dL (ref 70–99)
Potassium: 4.5 mmol/L (ref 3.5–5.1)
Sodium: 141 mmol/L (ref 135–145)

## 2022-05-26 LAB — CBC
HCT: 52.9 % — ABNORMAL HIGH (ref 36.0–46.0)
Hemoglobin: 17.4 g/dL — ABNORMAL HIGH (ref 12.0–15.0)
MCH: 30.3 pg (ref 26.0–34.0)
MCHC: 32.9 g/dL (ref 30.0–36.0)
MCV: 92 fL (ref 80.0–100.0)
Platelets: 257 10*3/uL (ref 150–400)
RBC: 5.75 MIL/uL — ABNORMAL HIGH (ref 3.87–5.11)
RDW: 13.1 % (ref 11.5–15.5)
WBC: 9.4 10*3/uL (ref 4.0–10.5)
nRBC: 0 % (ref 0.0–0.2)

## 2022-05-26 NOTE — Progress Notes (Signed)
Patient returns for ECG after starting amiodarone. ECG shows:  Coarse afib Vent. rate 80 BPM PR interval * ms QRS duration 94 ms QT/QTcB 386/445 ms  Patient scheduled for DCCV on 06/12/22. Lab work drawn today. Follow up with Dr Myles Gip as scheduled post DCCV.

## 2022-06-01 NOTE — Anesthesia Preprocedure Evaluation (Signed)
Anesthesia Evaluation  Patient identified by MRN, date of birth, ID band Patient awake    Reviewed: Allergy & Precautions, NPO status , Patient's Chart, lab work & pertinent test results  Airway Mallampati: II  TM Distance: >3 FB Neck ROM: Full    Dental no notable dental hx. (+) Chipped, Poor Dentition   Pulmonary COPD   Pulmonary exam normal breath sounds clear to auscultation       Cardiovascular hypertension, pulmonary hypertensionNormal cardiovascular exam+ dysrhythmias Atrial Fibrillation  Rhythm:Irregular Rate:Abnormal  11/2020 Echo 1. Left ventricular ejection fraction, by estimation, is 60 to 65%. The  left ventricle has normal function. The left ventricle has no regional  wall motion abnormalities. Left ventricular diastolic parameters are  indeterminate.     Neuro/Psych    Depression       GI/Hepatic   Endo/Other    Renal/GU      Musculoskeletal  (+) Arthritis ,    Abdominal   Peds  Hematology   Anesthesia Other Findings   Reproductive/Obstetrics                             Anesthesia Physical Anesthesia Plan  ASA: 3  Anesthesia Plan: General   Post-op Pain Management:    Induction: Intravenous  PONV Risk Score and Plan: Treatment may vary due to age or medical condition  Airway Management Planned: Mask  Additional Equipment: None  Intra-op Plan:   Post-operative Plan:   Informed Consent: I have reviewed the patients History and Physical, chart, labs and discussed the procedure including the risks, benefits and alternatives for the proposed anesthesia with the patient or authorized representative who has indicated his/her understanding and acceptance.     Dental advisory given  Plan Discussed with: CRNA and Anesthesiologist  Anesthesia Plan Comments:        Anesthesia Quick Evaluation

## 2022-06-02 ENCOUNTER — Encounter (HOSPITAL_COMMUNITY)
Admission: RE | Disposition: A | Discharge status: Home / Self Care | Payer: Self-pay | Source: Home / Self Care | Attending: Internal Medicine

## 2022-06-02 ENCOUNTER — Ambulatory Visit (HOSPITAL_COMMUNITY)
Admission: RE | Admit: 2022-06-02 | Discharge: 2022-06-02 | Disposition: A | Discharge status: Home / Self Care | Payer: Medicare HMO | Attending: Internal Medicine | Admitting: Internal Medicine

## 2022-06-02 ENCOUNTER — Encounter (HOSPITAL_COMMUNITY): Payer: Self-pay | Admitting: Internal Medicine

## 2022-06-02 ENCOUNTER — Other Ambulatory Visit: Payer: Self-pay

## 2022-06-02 ENCOUNTER — Ambulatory Visit (HOSPITAL_BASED_OUTPATIENT_CLINIC_OR_DEPARTMENT_OTHER): Payer: Medicare HMO | Admitting: Anesthesiology

## 2022-06-02 ENCOUNTER — Ambulatory Visit (HOSPITAL_COMMUNITY): Payer: Medicare HMO | Admitting: Anesthesiology

## 2022-06-02 DIAGNOSIS — J449 Chronic obstructive pulmonary disease, unspecified: Secondary | ICD-10-CM | POA: Insufficient documentation

## 2022-06-02 DIAGNOSIS — D6869 Other thrombophilia: Secondary | ICD-10-CM | POA: Diagnosis not present

## 2022-06-02 DIAGNOSIS — I110 Hypertensive heart disease with heart failure: Secondary | ICD-10-CM | POA: Insufficient documentation

## 2022-06-02 DIAGNOSIS — I5032 Chronic diastolic (congestive) heart failure: Secondary | ICD-10-CM | POA: Insufficient documentation

## 2022-06-02 DIAGNOSIS — I4891 Unspecified atrial fibrillation: Secondary | ICD-10-CM | POA: Diagnosis not present

## 2022-06-02 DIAGNOSIS — I272 Pulmonary hypertension, unspecified: Secondary | ICD-10-CM | POA: Insufficient documentation

## 2022-06-02 DIAGNOSIS — Z7901 Long term (current) use of anticoagulants: Secondary | ICD-10-CM | POA: Diagnosis not present

## 2022-06-02 DIAGNOSIS — I10 Essential (primary) hypertension: Secondary | ICD-10-CM | POA: Diagnosis not present

## 2022-06-02 DIAGNOSIS — Z6839 Body mass index (BMI) 39.0-39.9, adult: Secondary | ICD-10-CM | POA: Insufficient documentation

## 2022-06-02 DIAGNOSIS — E669 Obesity, unspecified: Secondary | ICD-10-CM | POA: Insufficient documentation

## 2022-06-02 DIAGNOSIS — I4819 Other persistent atrial fibrillation: Secondary | ICD-10-CM | POA: Insufficient documentation

## 2022-06-02 DIAGNOSIS — Z79899 Other long term (current) drug therapy: Secondary | ICD-10-CM | POA: Diagnosis not present

## 2022-06-02 HISTORY — PX: CARDIOVERSION: SHX1299

## 2022-06-02 SURGERY — CARDIOVERSION
Anesthesia: General

## 2022-06-02 MED ORDER — SODIUM CHLORIDE 0.9 % IV SOLN: INTRAVENOUS | Status: DC

## 2022-06-02 MED ORDER — SODIUM CHLORIDE 0.9 % IV SOLN: INTRAVENOUS | Status: DC | PRN

## 2022-06-02 MED ORDER — LIDOCAINE 2% (20 MG/ML) 5 ML SYRINGE
INTRAMUSCULAR | Status: DC | PRN
  Administered 2022-06-02: 100 mg via INTRAVENOUS

## 2022-06-02 MED ORDER — PROPOFOL 10 MG/ML IV BOLUS
INTRAVENOUS | Status: DC | PRN
  Administered 2022-06-02: 60 mg via INTRAVENOUS

## 2022-06-02 NOTE — Transfer of Care (Signed)
Immediate Anesthesia Transfer of Care Note  Patient: Kathryn Olsen  Procedure(s) Performed: CARDIOVERSION  Patient Location: Endoscopy Unit  Anesthesia Type:General  Level of Consciousness: awake and alert   Airway & Oxygen Therapy: Patient Spontanous Breathing  Post-op Assessment: Report given to RN, Post -op Vital signs reviewed and stable, and Patient moving all extremities X 4  Post vital signs: Reviewed and stable  Last Vitals:  Vitals Value Taken Time  BP 124/74   Temp    Pulse 59   Resp 17   SpO2 96     Last Pain:  Vitals:   06/02/22 0700  TempSrc: Oral  PainSc: 4          Complications: No notable events documented.

## 2022-06-02 NOTE — Anesthesia Postprocedure Evaluation (Signed)
Anesthesia Post Note  Patient: Kathryn Olsen  Procedure(s) Performed: CARDIOVERSION     Patient location during evaluation: PACU Anesthesia Type: General Level of consciousness: awake and alert Pain management: pain level controlled Vital Signs Assessment: post-procedure vital signs reviewed and stable Respiratory status: spontaneous breathing, nonlabored ventilation, respiratory function stable and patient connected to nasal cannula oxygen Cardiovascular status: blood pressure returned to baseline and stable Postop Assessment: no apparent nausea or vomiting Anesthetic complications: no  No notable events documented.  Last Vitals:  Vitals:   06/02/22 0755 06/02/22 0800  BP: 108/72 108/83  Pulse: (!) 56 (!) 56  Resp: 16 12  Temp:    SpO2: 94% 93%    Last Pain:  Vitals:   06/02/22 0752  TempSrc: Temporal  PainSc: [place]

## 2022-06-02 NOTE — Interval H&P Note (Signed)
History and Physical Interval Note:  06/02/2022 7:30 AM  Kathryn Olsen  has presented today for surgery, with the diagnosis of AFIB.  The various methods of treatment have been discussed with the patient and family. After consideration of risks, benefits and other options for treatment, the patient has consented to  Procedure(s): CARDIOVERSION (N/A) as a surgical intervention.  The patient's history has been reviewed, patient examined, no change in status, stable for surgery.  I have reviewed the patient's chart and labs.  Questions were answered to the patient's satisfaction.     Elouise Munroe

## 2022-06-02 NOTE — CV Procedure (Signed)
Procedure: Electrical Cardioversion Indications:  Atrial Fibrillation  Procedure Details:  Consent: Risks of procedure as well as the alternatives and risks of each were explained to the (patient/caregiver).  Consent for procedure obtained.  Time Out: Verified patient identification, verified procedure, site/side was marked, verified correct patient position, special equipment/implants available, medications/allergies/relevent history reviewed, required imaging and test results available. PERFORMED.  Patient placed on cardiac monitor, pulse oximetry, supplemental oxygen as necessary.  Sedation given:  propofol per anesthesia Pacer pads placed anterior and posterior chest.  Cardioverted 1 time(s).  Cardioversion with synchronized biphasic 120J shock.  Evaluation: Findings: Post procedure EKG shows:  sinus rhythm, junctional rhythm and sinus bradycardia. Complications: None Patient did tolerate procedure well.  Time Spent Directly with the Patient:  20 minutes   Elouise Munroe 06/02/2022, 7:40 AM

## 2022-06-02 NOTE — Discharge Instructions (Signed)

## 2022-06-02 NOTE — Anesthesia Procedure Notes (Signed)
Procedure Name: General with mask airway Date/Time: 06/02/2022 7:31 AM  Performed by: Harden Mo, CRNAPre-anesthesia Checklist: Patient identified, Emergency Drugs available, Suction available and Patient being monitored Patient Re-evaluated:Patient Re-evaluated prior to induction Oxygen Delivery Method: Ambu bag Preoxygenation: Pre-oxygenation with 100% oxygen Induction Type: IV induction Placement Confirmation: positive ETCO2 and breath sounds checked- equal and bilateral Dental Injury: Teeth and Oropharynx as per pre-operative assessment

## 2022-06-03 ENCOUNTER — Encounter (HOSPITAL_COMMUNITY): Payer: Self-pay | Admitting: Internal Medicine

## 2022-06-07 ENCOUNTER — Ambulatory Visit: Payer: Medicare HMO | Attending: Cardiovascular Disease | Admitting: Cardiovascular Disease

## 2022-06-07 ENCOUNTER — Encounter: Payer: Self-pay | Admitting: Cardiovascular Disease

## 2022-06-07 VITALS — BP 124/82 | HR 73 | Ht 65.0 in | Wt 242.8 lb

## 2022-06-07 DIAGNOSIS — I4819 Other persistent atrial fibrillation: Secondary | ICD-10-CM | POA: Diagnosis not present

## 2022-06-07 MED ORDER — AMIODARONE HCL 200 MG PO TABS: 200.0000 mg | ORAL_TABLET | Freq: Every day | ORAL | 1 refills | Status: DC

## 2022-06-07 NOTE — Addendum Note (Signed)
Addended by: Bernestine Amass on: 06/07/2022 11:59 AM   Modules accepted: Orders

## 2022-06-07 NOTE — Progress Notes (Signed)
Electrophysiology Office Note:    Date:  06/07/2022   ID:  Kathryn Olsen, DOB 23-Jan-1951, MRN [id number]  PCP:  Berkley Harvey, NP   [facility] Prov[facility] Cardiologist:  Carlyle Dolly, MD Electrophysiologist:  Melida Quitter, MD     Referring MD: Oliver Barre, PA   History of Present Illness:    Kathryn Olsen is a 72 y.o. female with a hx listed below, significant for persistent atrial fibrillation, CHFpEF, pulmonary hypertension, COPD, hypertension referred for arrhythmia management.  She was diagnosed with atrial fibrillation in May 2023 after presenting to the ER with chest discomfort.  DC cardioversion was performed in August 2023.  In follow-up in March 2024, she was noted to have had recurrence of atrial fibrillation which, according to her cardia mobile device have been persistent since October 2023.  She was started on amiodarone and underwent DC cardioversion on March 6.  She She feels much better after the cardioversion.  Her shortness of breath and fatigue are significantly improved.  Today, she denies chest pain, presyncope, syncope.   Past Medical History:  Diagnosis Date   Asthma    COPD (chronic obstructive pulmonary disease) ([place])    questionable   Degeneration of intervertebral disc, site unspecified    Depression    DJD (degenerative joint disease)    Dyslipidemia    Dysrhythmia    GERD (gastroesophageal reflux disease)    Pneumonia 04/14/2013   Shortness of breath    on exertion   Sleep apnea    stopbang=4   Unspecified essential hypertension     Past Surgical History:  Procedure Laterality Date   APPENDECTOMY     CARDIOVERSION N/A 10/21/2021   Procedure: CARDIOVERSION;  Surgeon: Arnoldo Lenis, MD;  Location: [facility];  Service: Endoscopy;  Laterality: N/A;   CARDIOVERSION N/A 06/02/2022   Procedure: CARDIOVERSION;  Surgeon: Elouise Munroe, MD;  Location: [facility] ENDOSCOPY;  Service: Cardiovascular;  Laterality: N/A;    ECTOPIC PREGNANCY SURGERY     HERNIA REPAIR     KNEE ARTHROSCOPY  03/14/2008   Right knee   KNEE ARTHROSCOPY  03/14/2010   Left knee   TOTAL KNEE ARTHROPLASTY  11/22/2011   Procedure: TOTAL KNEE ARTHROPLASTY;  Surgeon: Magnus Sinning, MD;  Location: [facility];  Service: Orthopedics;  Laterality: Left;   TOTAL KNEE REVISION Left 09/23/2013   Procedure: REVISION LEFT TOTAL KNEE WITH POLY EXCHANGE UPSIZE AND PATELLA REVISION;  Surgeon: Mauri Pole, MD;  Location: [facility];  Service: Orthopedics;  Laterality: Left;   UPPER GI ENDOSCOPY N/A 04/13/2021   Procedure: UPPER GI ENDOSCOPY;  Surgeon: Johnathan Hausen, MD;  Location: [facility];  Service: General;  Laterality: N/A;   XI ROBOTIC ASSISTED HIATAL HERNIA REPAIR N/A 04/13/2021   Procedure: 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     Allergies:   Ace inhibitors   Social and Family History: Reviewed in Epic  ROS:   Please see the history of present illness.    All other systems reviewed and are negative.  EKGs/Labs/Other Studies Reviewed Today:    Echocardiogram:  TTE - 12/04/2020 EF 60-65%. Mildly dilated LA   Monitors:   Stress testing:    Advanced imaging:   EKG:  Last  EKG results: today - sinus rhythm, IRBBB, LAFB   Recent Labs: 09/09/2021: Magnesium 2.5 05/16/2022: ALT 20; B Natriuretic Peptide 274.0; TSH 2.232 05/26/2022: BUN 19; Creatinine, Ser 0.98; Hemoglobin 17.4; Platelets 257; Potassium 4.5; Sodium 141     Physical Exam:    VS:  There were no vitals taken for this visit.    Wt Readings from Last 3 Encounters:  06/02/22 225 lb (102.1 kg)  05/18/22 238 lb 12.8 oz (108.3 kg)  05/16/22 244 lb 6.4 oz (110.9 kg)     GEN:  Well nourished,  well developed in no acute distress CARDIAC: RRR, no murmurs, rubs, gallops RESPIRATORY:  Normal work of breathing MUSCULOSKELETAL: no edema    ASSESSMENT & PLAN:    Persistent atrial fibrillation Symptomatic.  We discussed options for rhythm control.  She would prefer an early invasive strategy to Tikosyn. Continue amiodarone until ablation  We discussed the indication, rationale, logistics, anticipated benefits, and potential risks of the ablation procedure including but not limited to -- bleed at the groin access site, chest pain, damage to nearby organs such as the diaphragm, lungs, or esophagus, need for a drainage tube, or prolonged hospitalization. I explained that the risk for stroke, heart attack, need for open chest surgery, or even death is very low but not zero. she  expressed understanding and wishes to proceed.  Hypercoagulable state due to atrial fibrillation Continue apixaban 5 mg          Medication Adjustments/Labs and Tests Ordered: Current medicines are reviewed at length with the patient today.  Concerns regarding medicines are outlined above.  No orders of the defined types were placed in this encounter.  No orders of the defined types were placed in this encounter.    Signed, Melida Quitter, MD  06/07/2022 11:22 AM    [place]

## 2022-06-07 NOTE — Patient Instructions (Addendum)
Medication Instructions:  Your physician recommends that you continue on your current medications as directed. Please refer to the Current Medication list given to you today.  *If you need a refill on your cardiac medications before your next appointment, please call your pharmacy*  Lab Work: BMET and CBC at [facility] in [place] prior to your CT scan If you have labs (blood work) drawn today and your tests are completely normal, you will receive your results only by: [place] (if you have MyChart) OR A paper copy in the mail If you have any lab test that is abnormal or we need to change your treatment, we will call you to review the results.  Testing/Procedures: Your physician has requested that you have cardiac CT. Cardiac computed tomography (CT) is a painless test that uses an x-ray machine to take clear, detailed pictures of your heart. For further information please visit [email or web address]. Please follow instruction sheet as given. We will call you to schedule your CT scan. This will be done about one week prior to your ablation.   Your physician has recommended that you have an ablation. Catheter ablation is a medical procedure used to treat some cardiac arrhythmias (irregular heartbeats). During catheter ablation, a long, thin, flexible tube is put into a blood vessel in your groin (upper thigh), or neck. This tube is called an ablation catheter. It is then guided to your heart through the blood vessel. Radio frequency waves destroy small areas of heart tissue where abnormal heartbeats may cause an arrhythmia to start. Please see the instruction sheet given to you today. You are scheduled for Atrial Fibrillation Ablation on Wednesday, July 17 with Dr. Kaleen Odea.Please arrive at the Main Entrance A at [facility]: [place], [place] [address] at 6:30 AM    Follow-Up: At [facility], you and your health needs are our priority.  As part of our  continuing mission to provide you with exceptional heart care, we have created designated Provider Care Teams.  These Care Teams include your primary Cardiologist (physician) and Advanced Practice Providers (APPs -  Physician Assistants and Nurse Practitioners) who all work together to provide you with the care you need, when you need it.  Your next appointment:   We will call you to arrange your follow up appointments.

## 2022-06-07 NOTE — H&P (View-Only) (Signed)
Electrophysiology Office Note:    Date:  06/07/2022   ID:  Collie Olsen, DOB 01/19/1951, MRN [id number]  PCP:  Jones, Penny L, NP   [facility] Providers Cardiologist:  Branch, Jonathan, MD Electrophysiologist:  Hadia Minier E Esbeidy Mclaine, MD     Referring MD: Fenton, Clint R, PA   History of Present Illness:    Kathryn Olsen is a 72 y.o. female with a hx listed below, significant for persistent atrial fibrillation, CHFpEF, pulmonary hypertension, COPD, hypertension referred for arrhythmia management.  She was diagnosed with atrial fibrillation in May 2023 after presenting to the ER with chest discomfort.  DC cardioversion was performed in August 2023.  In follow-up in March 2024, she was noted to have had recurrence of atrial fibrillation which, according to her cardia mobile device have been persistent since October 2023.  She was started on amiodarone and underwent DC cardioversion on March 6.  She She feels much better after the cardioversion.  Her shortness of breath and fatigue are significantly improved.  Today, she denies chest pain, presyncope, syncope.   Past Medical History:  Diagnosis Date   Asthma    COPD (chronic obstructive pulmonary disease) ([facility])    questionable   Degeneration of intervertebral disc, site unspecified    Depression    DJD (degenerative joint disease)    Dyslipidemia    Dysrhythmia    GERD (gastroesophageal reflux disease)    Pneumonia 04/14/2013   Shortness of breath    on exertion   Sleep apnea    stopbang=4   Unspecified essential hypertension     Past Surgical History:  Procedure Laterality Date   APPENDECTOMY     CARDIOVERSION N/A 10/21/2021   Procedure: CARDIOVERSION;  Surgeon: Branch, Jonathan F, MD;  Location: [facility];  Service: Endoscopy;  Laterality: N/A;   CARDIOVERSION N/A 06/02/2022   Procedure: CARDIOVERSION;  Surgeon: Acharya, Gayatri A, MD;  Location: [facility] ENDOSCOPY;  Service: Cardiovascular;  Laterality: N/A;    ECTOPIC PREGNANCY SURGERY     HERNIA REPAIR     KNEE ARTHROSCOPY  03/14/2008   Right knee   KNEE ARTHROSCOPY  03/14/2010   Left knee   TOTAL KNEE ARTHROPLASTY  11/22/2011   Procedure: TOTAL KNEE ARTHROPLASTY;  Surgeon: James P Aplington, MD;  [facility];  Service: Orthopedics;  Laterality: Left;   TOTAL KNEE REVISION Left 09/23/2013   Procedure: REVISION LEFT TOTAL KNEE WITH POLY EXCHANGE UPSIZE AND PATELLA REVISION;  Surgeon: Matthew D Olin, MD;  Location[facility];  Service: Orthopedics;  Laterality: Left;   UPPER GI ENDOSCOPY N/A 04/13/2021   Procedure: UPPER GI ENDOSCOPY;  Surgeon: Martin, Matthew, MD;  Location: [facility];  Service: General;  Laterality: N/A;   XI ROBOTIC ASSISTED HIATAL HERNIA REPAIR N/A 04/13/2021   Procedure: 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     Allergies:   Ace inhibitors   Social and Family History: Reviewed in Epic  ROS:   Please see the history of present illness.    All other systems reviewed and are negative.  EKGs/Labs/Other Studies Reviewed Today:    Echocardiogram:  TTE - 12/04/2020 EF 60-65%. Mildly dilated LA   Monitors:   Stress testing:    Advanced imaging:   EKG:  Last   EKG results: today - sinus rhythm, IRBBB, LAFB   Recent Labs: 09/09/2021: Magnesium 2.5 05/16/2022: ALT 20; B Natriuretic Peptide 274.0; TSH 2.232 05/26/2022: BUN 19; Creatinine, Ser 0.98; Hemoglobin 17.4; Platelets 257; Potassium 4.5; Sodium 141     Physical Exam:    VS:  There were no vitals taken for this visit.    Wt Readings from Last 3 Encounters:  06/02/22 225 lb (102.1 kg)  05/18/22 238 lb 12.8 oz (108.3 kg)  05/16/22 244 lb 6.4 oz (110.9 kg)     GEN:  Well nourished,  well developed in no acute distress CARDIAC: RRR, no murmurs, rubs, gallops RESPIRATORY:  Normal work of breathing MUSCULOSKELETAL: no edema    ASSESSMENT & PLAN:    Persistent atrial fibrillation Symptomatic.  We discussed options for rhythm control.  She would prefer an early invasive strategy to Tikosyn. Continue amiodarone until ablation  We discussed the indication, rationale, logistics, anticipated benefits, and potential risks of the ablation procedure including but not limited to -- bleed at the groin access site, chest pain, damage to nearby organs such as the diaphragm, lungs, or esophagus, need for a drainage tube, or prolonged hospitalization. I explained that the risk for stroke, heart attack, need for open chest surgery, or even death is very low but not zero. she  expressed understanding and wishes to proceed.  Hypercoagulable state due to atrial fibrillation Continue apixaban 5 mg          Medication Adjustments/Labs and Tests Ordered: Current medicines are reviewed at length with the patient today.  Concerns regarding medicines are outlined above.  No orders of the defined types were placed in this encounter.  No orders of the defined types were placed in this encounter.    Signed, Sincerity Cedar E Kenijah [place], MD  06/07/2022 11:22 AM    [facility] 

## 2022-06-08 ENCOUNTER — Other Ambulatory Visit (HOSPITAL_COMMUNITY)
Admission: RE | Admit: 2022-06-08 | Discharge: 2022-06-08 | Disposition: A | Discharge status: Home / Self Care | Payer: Medicare HMO | Source: Ambulatory Visit | Attending: Cardiovascular Disease | Admitting: Cardiovascular Disease

## 2022-06-08 ENCOUNTER — Telehealth (HOSPITAL_COMMUNITY): Payer: Self-pay | Admitting: *Deleted

## 2022-06-08 DIAGNOSIS — I4819 Other persistent atrial fibrillation: Secondary | ICD-10-CM | POA: Diagnosis present

## 2022-06-08 LAB — CBC WITH DIFFERENTIAL/PLATELET
Abs Immature Granulocytes: 0.02 10*3/uL (ref 0.00–0.07)
Basophils Absolute: 0.1 10*3/uL (ref 0.0–0.1)
Basophils Relative: 1 %
Eosinophils Absolute: 0.2 10*3/uL (ref 0.0–0.5)
Eosinophils Relative: 3 %
HCT: 51.5 % — ABNORMAL HIGH (ref 36.0–46.0)
Hemoglobin: 16.9 g/dL — ABNORMAL HIGH (ref 12.0–15.0)
Immature Granulocytes: 0 %
Lymphocytes Relative: 24 %
Lymphs Abs: 1.8 10*3/uL (ref 0.7–4.0)
MCH: 30.3 pg (ref 26.0–34.0)
MCHC: 32.8 g/dL (ref 30.0–36.0)
MCV: 92.5 fL (ref 80.0–100.0)
Monocytes Absolute: 0.6 10*3/uL (ref 0.1–1.0)
Monocytes Relative: 8 %
Neutro Abs: 5 10*3/uL (ref 1.7–7.7)
Neutrophils Relative %: 64 %
Platelets: 226 10*3/uL (ref 150–400)
RBC: 5.57 MIL/uL — ABNORMAL HIGH (ref 3.87–5.11)
RDW: 13.2 % (ref 11.5–15.5)
WBC: 7.7 10*3/uL (ref 4.0–10.5)
nRBC: 0 % (ref 0.0–0.2)

## 2022-06-08 LAB — BASIC METABOLIC PANEL
Anion gap: 9 (ref 5–15)
BUN: 23 mg/dL (ref 8–23)
CO2: 26 mmol/L (ref 22–32)
Calcium: 9 mg/dL (ref 8.9–10.3)
Chloride: 103 mmol/L (ref 98–111)
Creatinine, Ser: 1 mg/dL (ref 0.44–1.00)
GFR, Estimated: 59 mL/min — ABNORMAL LOW (ref >60)
Glucose, Bld: 100 mg/dL — ABNORMAL HIGH (ref 70–99)
Potassium: 3.9 mmol/L (ref 3.5–5.1)
Sodium: 138 mmol/L (ref 135–145)

## 2022-06-08 NOTE — Telephone Encounter (Signed)
Reaching out to patient to offer assistance regarding upcoming cardiac imaging study; pt verbalizes understanding of appt date/time, parking situation and where to check in, pre-test NPO status and verified current allergies; name and call back number provided for further questions should they arise  Gordy Clement RN Navigator Cardiac [place] [facility] [phone] office [phone] cell  Patient aware to arrive at 10am.

## 2022-06-09 ENCOUNTER — Ambulatory Visit (HOSPITAL_COMMUNITY)
Admission: RE | Admit: 2022-06-09 | Discharge: 2022-06-09 | Disposition: A | Discharge status: Home / Self Care | Payer: Medicare HMO | Source: Ambulatory Visit | Attending: Cardiovascular Disease | Admitting: Cardiovascular Disease

## 2022-06-09 ENCOUNTER — Other Ambulatory Visit: Payer: Self-pay | Admitting: Cardiology

## 2022-06-09 DIAGNOSIS — I4891 Unspecified atrial fibrillation: Secondary | ICD-10-CM

## 2022-06-09 DIAGNOSIS — I4819 Other persistent atrial fibrillation: Secondary | ICD-10-CM

## 2022-06-09 MED ORDER — IOHEXOL 350 MG/ML SOLN
100.0000 mL | Freq: Once | INTRAVENOUS | Status: AC | PRN
  Administered 2022-06-09: 100 mL via INTRAVENOUS

## 2022-06-09 NOTE — Telephone Encounter (Signed)
Prescription refill request for Eliquis received. Indication: AF Last office visit: 06/07/22  A Mealor MD Scr: 1.00 on 06/08/22 Age: 72 Weight: 110.1kg  Based on above findings Eliquis 5mg  twice daily is the appropriate dose.  Refill approved.

## 2022-06-14 ENCOUNTER — Telehealth: Payer: Self-pay | Admitting: Cardiovascular Disease

## 2022-06-14 NOTE — Telephone Encounter (Signed)
Patient called she is scheduled for ablation on 4/3 and is seeking clarification if she should continue taking Eliquis. Patient was advised to take her evening dose of Eliquis tonight and do not take any mediations on the morning of the procedure.Patient voiced understanding.

## 2022-06-14 NOTE — Pre-Procedure Instructions (Signed)
Attempted to call patient regarding procedure instructions.  Left voice mail on the following items: Arrival time 0600 Nothing to eat or drink after midnight No meds AM of procedure Responsible person to drive you home and stay with you for 24 hrs  Have you missed any doses of anti-coagulant Eliquis- please let office know if you have missed any doses.  Don't take dose in the morning.

## 2022-06-14 NOTE — Anesthesia Preprocedure Evaluation (Signed)
Anesthesia Evaluation  Patient identified by MRN, date of birth, ID band Patient awake    Reviewed: Allergy & Precautions, NPO status , Patient's Chart, lab work & pertinent test results  Airway Mallampati: II  TM Distance: >3 FB Neck ROM: Full    Dental no notable dental hx.    Pulmonary asthma , sleep apnea , COPD   Pulmonary exam normal        Cardiovascular hypertension, Pt. on medications + dysrhythmias Atrial Fibrillation  Rhythm:Irregular Rate:Normal  ECHO 2022:  1. Left ventricular ejection fraction, by estimation, is 60 to 65%. The  left ventricle has normal function. The left ventricle has no regional  wall motion abnormalities. Left ventricular diastolic parameters are  indeterminate.   2. Right ventricular systolic function is normal. The right ventricular  size is mildly enlarged. There is moderately elevated pulmonary artery  systolic pressure.   3. Left atrial size was mildly dilated.   4. Right atrial size was mildly dilated.   5. The mitral valve is normal in structure. No evidence of mitral valve  regurgitation. No evidence of mitral stenosis.   6. The tricuspid valve is abnormal.   7. The aortic valve is tricuspid. Aortic valve regurgitation is not  visualized. No aortic stenosis is present.   8. The inferior vena cava is normal in size with greater than 50%  respiratory variability, suggesting right atrial pressure of 3 mmHg.     Neuro/Psych    Depression    negative neurological ROS     GI/Hepatic Neg liver ROS,GERD  ,,  Endo/Other  negative endocrine ROS    Renal/GU negative Renal ROS  negative genitourinary   Musculoskeletal  (+) Arthritis , Osteoarthritis,    Abdominal Normal abdominal exam  (+)   Peds  Hematology negative hematology ROS (+)   Anesthesia Other Findings   Reproductive/Obstetrics                             Anesthesia Physical Anesthesia  Plan  ASA: 3  Anesthesia Plan: General   Post-op Pain Management:    Induction: Intravenous  PONV Risk Score and Plan: 3 and Ondansetron, Dexamethasone and Treatment may vary due to age or medical condition  Airway Management Planned: Mask and Oral ETT  Additional Equipment: None  Intra-op Plan:   Post-operative Plan: Extubation in OR  Informed Consent: I have reviewed the patients History and Physical, chart, labs and discussed the procedure including the risks, benefits and alternatives for the proposed anesthesia with the patient or authorized representative who has indicated his/her understanding and acceptance.     Dental advisory given  Plan Discussed with: CRNA  Anesthesia Plan Comments:         Anesthesia Quick Evaluation

## 2022-06-14 NOTE — Telephone Encounter (Signed)
Pt c/o medication issue:  1. Name of Medication:   ELIQUIS 5 MG TABS tablet    2. How are you currently taking this medication (dosage and times per day)?   TAKE 1 TABLET(5 MG) BY MOUTH TWICE DAILY    3. Are you having a reaction (difficulty breathing--STAT)? No  4. What is your medication issue?  Pt would like a callback as to whether she is able to take medication before Ablation tomorrow. Please advise

## 2022-06-15 ENCOUNTER — Other Ambulatory Visit (HOSPITAL_COMMUNITY): Payer: Self-pay

## 2022-06-15 ENCOUNTER — Encounter (HOSPITAL_COMMUNITY)
Admission: RE | Disposition: A | Discharge status: Home / Self Care | Payer: Self-pay | Source: Home / Self Care | Attending: Cardiovascular Disease

## 2022-06-15 ENCOUNTER — Other Ambulatory Visit: Payer: Self-pay

## 2022-06-15 ENCOUNTER — Ambulatory Visit (HOSPITAL_COMMUNITY)
Admission: RE | Admit: 2022-06-15 | Discharge: 2022-06-15 | Disposition: A | Discharge status: Home / Self Care | Payer: Medicare HMO | Attending: Cardiovascular Disease | Admitting: Cardiovascular Disease

## 2022-06-15 ENCOUNTER — Ambulatory Visit (HOSPITAL_BASED_OUTPATIENT_CLINIC_OR_DEPARTMENT_OTHER): Payer: Medicare HMO | Admitting: Anesthesiology

## 2022-06-15 ENCOUNTER — Ambulatory Visit (HOSPITAL_COMMUNITY): Payer: Medicare HMO | Admitting: Anesthesiology

## 2022-06-15 DIAGNOSIS — I272 Pulmonary hypertension, unspecified: Secondary | ICD-10-CM | POA: Insufficient documentation

## 2022-06-15 DIAGNOSIS — G4730 Sleep apnea, unspecified: Secondary | ICD-10-CM | POA: Insufficient documentation

## 2022-06-15 DIAGNOSIS — I110 Hypertensive heart disease with heart failure: Secondary | ICD-10-CM | POA: Insufficient documentation

## 2022-06-15 DIAGNOSIS — J449 Chronic obstructive pulmonary disease, unspecified: Secondary | ICD-10-CM

## 2022-06-15 DIAGNOSIS — Z7901 Long term (current) use of anticoagulants: Secondary | ICD-10-CM | POA: Diagnosis not present

## 2022-06-15 DIAGNOSIS — I10 Essential (primary) hypertension: Secondary | ICD-10-CM

## 2022-06-15 DIAGNOSIS — I4819 Other persistent atrial fibrillation: Secondary | ICD-10-CM | POA: Diagnosis present

## 2022-06-15 DIAGNOSIS — I5032 Chronic diastolic (congestive) heart failure: Secondary | ICD-10-CM | POA: Insufficient documentation

## 2022-06-15 DIAGNOSIS — I4891 Unspecified atrial fibrillation: Secondary | ICD-10-CM

## 2022-06-15 DIAGNOSIS — D6869 Other thrombophilia: Secondary | ICD-10-CM | POA: Diagnosis not present

## 2022-06-15 HISTORY — PX: ATRIAL FIBRILLATION ABLATION: EP1191

## 2022-06-15 SURGERY — ATRIAL FIBRILLATION ABLATION
Anesthesia: General

## 2022-06-15 MED ORDER — HEPARIN SODIUM (PORCINE) 1000 UNIT/ML IJ SOLN
INTRAMUSCULAR | Status: DC | PRN
  Administered 2022-06-15: 18000 [IU] via INTRAVENOUS

## 2022-06-15 MED ORDER — PHENYLEPHRINE HCL-NACL 20-0.9 MG/250ML-% IV SOLN
INTRAVENOUS | Status: DC | PRN
  Administered 2022-06-15: 20 ug/min via INTRAVENOUS

## 2022-06-15 MED ORDER — PROTAMINE SULFATE 10 MG/ML IV SOLN
INTRAVENOUS | Status: DC | PRN
  Administered 2022-06-15: 25 mg via INTRAVENOUS

## 2022-06-15 MED ORDER — DEXAMETHASONE SODIUM PHOSPHATE 10 MG/ML IJ SOLN
INTRAMUSCULAR | Status: DC | PRN
  Administered 2022-06-15: 10 mg via INTRAVENOUS

## 2022-06-15 MED ORDER — SODIUM CHLORIDE 0.9% FLUSH: 3.0000 mL | INTRAVENOUS | Status: DC | PRN

## 2022-06-15 MED ORDER — SODIUM CHLORIDE 0.9% FLUSH: 3.0000 mL | Freq: Two times a day (BID) | INTRAVENOUS | Status: DC

## 2022-06-15 MED ORDER — HEPARIN SODIUM (PORCINE) 1000 UNIT/ML IJ SOLN
INTRAMUSCULAR | Status: DC | PRN
  Administered 2022-06-15: 1000 [IU] via INTRAVENOUS

## 2022-06-15 MED ORDER — PROPOFOL 10 MG/ML IV BOLUS
INTRAVENOUS | Status: DC | PRN
  Administered 2022-06-15: 150 mg via INTRAVENOUS

## 2022-06-15 MED ORDER — HEPARIN (PORCINE) IN NACL 1000-0.9 UT/500ML-% IV SOLN
INTRAVENOUS | Status: DC | PRN
  Administered 2022-06-15 (×4): 500 mL

## 2022-06-15 MED ORDER — SODIUM CHLORIDE 0.9 % IV SOLN: 250.0000 mL | INTRAVENOUS | Status: DC | PRN

## 2022-06-15 MED ORDER — FENTANYL CITRATE (PF) 100 MCG/2ML IJ SOLN: 25.0000 ug | INTRAMUSCULAR | Status: DC | PRN

## 2022-06-15 MED ORDER — ACETAMINOPHEN 10 MG/ML IV SOLN: 1000.0000 mg | Freq: Once | INTRAVENOUS | Status: DC | PRN

## 2022-06-15 MED ORDER — ROCURONIUM BROMIDE 10 MG/ML (PF) SYRINGE
PREFILLED_SYRINGE | INTRAVENOUS | Status: DC | PRN
  Administered 2022-06-15: 30 mg via INTRAVENOUS
  Administered 2022-06-15: 50 mg via INTRAVENOUS

## 2022-06-15 MED ORDER — COLCHICINE 0.6 MG PO TABS
0.6000 mg | ORAL_TABLET | Freq: Every day | ORAL | 0 refills | Status: DC
  Filled 2022-06-15: qty 5, 5d supply, fill #0

## 2022-06-15 MED ORDER — PHENYLEPHRINE 80 MCG/ML (10ML) SYRINGE FOR IV PUSH (FOR BLOOD PRESSURE SUPPORT)
PREFILLED_SYRINGE | INTRAVENOUS | Status: DC | PRN
  Administered 2022-06-15: 80 ug via INTRAVENOUS

## 2022-06-15 MED ORDER — ONDANSETRON HCL 4 MG/2ML IJ SOLN
INTRAMUSCULAR | Status: DC | PRN
  Administered 2022-06-15: 4 mg via INTRAVENOUS

## 2022-06-15 MED ORDER — SUGAMMADEX SODIUM 200 MG/2ML IV SOLN
INTRAVENOUS | Status: DC | PRN
  Administered 2022-06-15: 300 mg via INTRAVENOUS

## 2022-06-15 MED ORDER — FENTANYL CITRATE (PF) 100 MCG/2ML IJ SOLN
INTRAMUSCULAR | Status: AC
  Filled 2022-06-15: qty 2

## 2022-06-15 MED ORDER — EPHEDRINE SULFATE-NACL 50-0.9 MG/10ML-% IV SOSY
PREFILLED_SYRINGE | INTRAVENOUS | Status: DC | PRN
  Administered 2022-06-15: 10 mg via INTRAVENOUS
  Administered 2022-06-15: 5 mg via INTRAVENOUS
  Administered 2022-06-15: 10 mg via INTRAVENOUS

## 2022-06-15 MED ORDER — LIDOCAINE 2% (20 MG/ML) 5 ML SYRINGE
INTRAMUSCULAR | Status: DC | PRN
  Administered 2022-06-15: 60 mg via INTRAVENOUS

## 2022-06-15 MED ORDER — SODIUM CHLORIDE 0.9 % IV SOLN: INTRAVENOUS | Status: DC

## 2022-06-15 MED ORDER — ACETAMINOPHEN 325 MG PO TABS
650.0000 mg | ORAL_TABLET | ORAL | Status: DC | PRN
  Administered 2022-06-15: 650 mg via ORAL
  Filled 2022-06-15: qty 2

## 2022-06-15 MED ORDER — DOBUTAMINE INFUSION FOR EP/ECHO/NUC (1000 MCG/ML)
INTRAVENOUS | Status: DC | PRN
  Administered 2022-06-15: 20 ug/kg/min via INTRAVENOUS

## 2022-06-15 MED ORDER — FENTANYL CITRATE (PF) 100 MCG/2ML IJ SOLN
INTRAMUSCULAR | Status: DC | PRN
  Administered 2022-06-15: 100 ug via INTRAVENOUS

## 2022-06-15 MED ORDER — PANTOPRAZOLE SODIUM 40 MG PO TBEC
40.0000 mg | DELAYED_RELEASE_TABLET | Freq: Every day | ORAL | 0 refills | Status: DC
  Filled 2022-06-15: qty 30, 30d supply, fill #0

## 2022-06-15 MED ORDER — ONDANSETRON HCL 4 MG/2ML IJ SOLN: 4.0000 mg | Freq: Four times a day (QID) | INTRAMUSCULAR | Status: DC | PRN

## 2022-06-15 SURGICAL SUPPLY — 21 items
BAG SNAP BAND KOVER 36X36 (MISCELLANEOUS) IMPLANT
BLANKET WARM UNDERBOD FULL ACC (MISCELLANEOUS) ×1 IMPLANT
CATH ABLAT QDOT MICRO BI TC DF (CATHETERS) IMPLANT
CATH OCTARAY 2.0 F 3-3-3-3-3 (CATHETERS) IMPLANT
CATH PIGTAIL STEERABLE D1 8.7 (WIRE) IMPLANT
CATH S-M CIRCA TEMP PROBE (CATHETERS) IMPLANT
CATH SOUNDSTAR ECO 8FR (CATHETERS) IMPLANT
CATH WEBSTER BI DIR CS D-F CRV (CATHETERS) IMPLANT
CLOSURE PERCLOSE PROSTYLE (VASCULAR PRODUCTS) IMPLANT
COVER SWIFTLINK CONNECTOR (BAG) ×1 IMPLANT
DEVICE CLOSURE MYNXGRIP 6/7F (Vascular Products) IMPLANT
MAT PREVALON FULL STRYKER (MISCELLANEOUS) IMPLANT
PACK EP LATEX FREE (CUSTOM PROCEDURE TRAY) ×1
PACK EP LF (CUSTOM PROCEDURE TRAY) ×1 IMPLANT
PAD DEFIB RADIO PHYSIO CONN (PAD) ×1 IMPLANT
PATCH CARTO3 (PAD) IMPLANT
SHEATH CARTO VIZIGO MED CURVE (SHEATH) IMPLANT
SHEATH PINNACLE 8F 10CM (SHEATH) IMPLANT
SHEATH PINNACLE 9F 10CM (SHEATH) IMPLANT
SHEATH PROBE COVER 6X72 (BAG) IMPLANT
TUBING SMART ABLATE COOLFLOW (TUBING) IMPLANT

## 2022-06-15 NOTE — Anesthesia Postprocedure Evaluation (Signed)
Anesthesia Post Note  Patient: Kathryn Olsen  Procedure(s) Performed: ATRIAL FIBRILLATION ABLATION     Patient location during evaluation: PACU Anesthesia Type: General Level of consciousness: awake and alert Pain management: pain level controlled Vital Signs Assessment: post-procedure vital signs reviewed and stable Respiratory status: spontaneous breathing, nonlabored ventilation, respiratory function stable and patient connected to nasal cannula oxygen Cardiovascular status: blood pressure returned to baseline and stable Postop Assessment: no apparent nausea or vomiting Anesthetic complications: no   No notable events documented.  Last Vitals:  Vitals:   06/15/22 1300 06/15/22 1315  BP: 132/77 135/75  Pulse: 69 69  Resp: 18 20  Temp:    SpO2: 91% 91%    Last Pain:  Vitals:   06/15/22 1120  PainSc: 3                  [name]

## 2022-06-15 NOTE — Discharge Instructions (Signed)
Post procedure care instructions No driving for 4 days. No lifting over 5 lbs for 1 week. No vigorous or sexual activity for 1 week. You may return to work/your usual activities on 06/23/22. Keep procedure site clean & dry. If you notice increased pain, swelling, bleeding or pus, call/return!  You may shower after 24 hours, but no soaking in baths/hot tubs/pools for 1 week.    You have an appointment set up with the [facility].  Multiple studies have shown that being followed by a dedicated atrial fibrillation clinic in addition to the standard care you receive from your other physicians improves health. We believe that enrollment in the atrial fibrillation clinic will allow [place] to better care for you.   The phone number to the [facility] is [phone]. The clinic is staffed Monday through Friday from 8:30am to 5pm.  Directions: The clinic is located in the [facility], [place] the hospital at the MAIN ENTRANCE "A", use [address] to the 6th floor.  Registration desk to the right of elevators on 6th floor  If you have any trouble locating the clinic, please don't hesitate to call [phone].

## 2022-06-15 NOTE — Transfer of Care (Signed)
Immediate Anesthesia Transfer of Care Note  Patient: Kathryn Olsen  Procedure(s) Performed: ATRIAL FIBRILLATION ABLATION  Patient Location: PACU  Anesthesia Type:General  Level of Consciousness: oriented, drowsy, and patient cooperative  Airway & Oxygen Therapy: Patient Spontanous Breathing and Patient connected to nasal cannula oxygen  Post-op Assessment: Report given to RN and Post -op Vital signs reviewed and stable  Post vital signs: Reviewed and stable  Last Vitals:  Vitals Value Taken Time  BP    Temp    Pulse 69 06/15/22 1037  Resp 17 06/15/22 1037  SpO2 92 % 06/15/22 1037  Vitals shown include unvalidated device data.  Last Pain:  Vitals:   06/15/22 0620  PainSc: 0-No pain      Patients Stated Pain Goal: 4 ([id number])  Complications: No notable events documented.

## 2022-06-15 NOTE — Interval H&P Note (Signed)
History and Physical Interval Note:  06/15/2022 7:17 AM  Kathryn Olsen  has presented today for surgery, with the diagnosis of afib.  The various methods of treatment have been discussed with the patient and family. After consideration of risks, benefits and other options for treatment, the patient has consented to  Procedure(s): ATRIAL FIBRILLATION ABLATION (N/A) as a surgical intervention.  The patient's history has been reviewed, patient examined, no change in status, stable for surgery.  I have reviewed the patient's chart and labs.  Questions were answered to the patient's satisfaction.     Yetta Barre Dinita Migliaccio

## 2022-06-15 NOTE — Progress Notes (Signed)
Up and walked and tolerated well; bilat groins stable, no bleeding or hematoma 

## 2022-06-15 NOTE — Anesthesia Procedure Notes (Signed)
Procedure Name: Intubation Date/Time: 06/15/2022 8:36 AM  Performed by: Lind Guest, CRNAPre-anesthesia Checklist: Patient identified, Emergency Drugs available, Suction available, Patient being monitored and Timeout performed Patient Re-evaluated:Patient Re-evaluated prior to induction Oxygen Delivery Method: Circle system utilized Preoxygenation: Pre-oxygenation with 100% oxygen Induction Type: IV induction Ventilation: Mask ventilation without difficulty Laryngoscope Size: Mac and 3 Grade View: Grade I Tube type: Oral Tube size: 7.5 mm Number of attempts: 1 Placement Confirmation: ETT inserted through vocal cords under direct vision, positive ETCO2 and breath sounds checked- equal and bilateral Secured at: 23 cm Tube secured with: Tape Dental Injury: Teeth and Oropharynx as per pre-operative assessment

## 2022-06-16 ENCOUNTER — Other Ambulatory Visit (HOSPITAL_COMMUNITY): Payer: Self-pay | Admitting: Physician Assistant

## 2022-06-16 ENCOUNTER — Encounter (HOSPITAL_COMMUNITY): Payer: Self-pay | Admitting: Cardiovascular Disease

## 2022-06-16 DIAGNOSIS — I4819 Other persistent atrial fibrillation: Secondary | ICD-10-CM

## 2022-06-16 LAB — POCT ACTIVATED CLOTTING TIME
Activated Clotting Time: 374 seconds
Activated Clotting Time: 347 seconds

## 2022-06-24 ENCOUNTER — Other Ambulatory Visit: Payer: Self-pay | Admitting: Cardiology

## 2022-06-27 ENCOUNTER — Other Ambulatory Visit: Payer: Self-pay

## 2022-06-27 DIAGNOSIS — I4891 Unspecified atrial fibrillation: Secondary | ICD-10-CM

## 2022-06-27 MED ORDER — DILTIAZEM HCL ER COATED BEADS 180 MG PO CP24: 180.0000 mg | ORAL_CAPSULE | Freq: Every day | ORAL | 1 refills | Status: DC

## 2022-06-27 MED ORDER — DILTIAZEM HCL 60 MG PO TABS
ORAL_TABLET | ORAL | 0 refills | Status: AC
Start: 2022-06-27 — End: ?

## 2022-06-27 NOTE — Telephone Encounter (Signed)
Refilled diltiazem 180 mg qd to mail order

## 2022-07-13 ENCOUNTER — Ambulatory Visit (HOSPITAL_COMMUNITY)
Admission: RE | Admit: 2022-07-13 | Discharge: 2022-07-13 | Disposition: A | Discharge status: Home / Self Care | Payer: Medicare HMO | Source: Ambulatory Visit | Attending: Physician Assistant | Admitting: Physician Assistant

## 2022-07-13 VITALS — BP 114/74 | HR 65 | Ht 65.0 in | Wt 246.4 lb

## 2022-07-13 DIAGNOSIS — Z5181 Encounter for therapeutic drug level monitoring: Secondary | ICD-10-CM | POA: Diagnosis not present

## 2022-07-13 DIAGNOSIS — R600 Localized edema: Secondary | ICD-10-CM | POA: Insufficient documentation

## 2022-07-13 DIAGNOSIS — Z6841 Body Mass Index (BMI) 40.0 and over, adult: Secondary | ICD-10-CM | POA: Insufficient documentation

## 2022-07-13 DIAGNOSIS — E669 Obesity, unspecified: Secondary | ICD-10-CM | POA: Diagnosis not present

## 2022-07-13 DIAGNOSIS — D6869 Other thrombophilia: Secondary | ICD-10-CM | POA: Diagnosis not present

## 2022-07-13 DIAGNOSIS — I4819 Other persistent atrial fibrillation: Secondary | ICD-10-CM | POA: Diagnosis present

## 2022-07-13 DIAGNOSIS — I5032 Chronic diastolic (congestive) heart failure: Secondary | ICD-10-CM | POA: Diagnosis not present

## 2022-07-13 DIAGNOSIS — I272 Pulmonary hypertension, unspecified: Secondary | ICD-10-CM | POA: Insufficient documentation

## 2022-07-13 DIAGNOSIS — I110 Hypertensive heart disease with heart failure: Secondary | ICD-10-CM | POA: Insufficient documentation

## 2022-07-13 DIAGNOSIS — Z7901 Long term (current) use of anticoagulants: Secondary | ICD-10-CM | POA: Insufficient documentation

## 2022-07-13 DIAGNOSIS — Z79899 Other long term (current) drug therapy: Secondary | ICD-10-CM

## 2022-07-13 LAB — BRAIN NATRIURETIC PEPTIDE: B Natriuretic Peptide: 158.8 pg/mL — ABNORMAL HIGH (ref 0.0–100.0)

## 2022-07-13 NOTE — Patient Instructions (Signed)
Increase lasix to 40mg  daily for 3 days   Increase Kdur (potassium) to an extra daily for the next 3 days

## 2022-07-13 NOTE — Progress Notes (Signed)
Primary Care Physician: Iona Hansen, NP Primary Cardiologist: Dr Dina Rich Primary Electrophysiologist: none Referring Physician: Jacolyn Reedy PA   Kathryn Olsen is a 72 y.o. female with a history of HTN, chronic diastolic CHF, COPD, HTN, pulmonary HTN, atrial fibrillation who presents for consultation in the [facility].  The patient was initially diagnosed with atrial fibrillation 07/2021 after presenting to the ED from her PCP office with symptoms of chest discomfort. She was rate controlled and underwent outpatient DCCV on 10/21/21. Patient is on Eliquis for a CHADS2VASC score of 4.   She was seen 05/16/22 and found to be back in afib. Patient reports that her Lourena Simmonds mobile alerted her she was back in afib starting 12/2021. She was started on diltiazem and referred to the AF clinic to discuss options. She was started on amiodarone as a bridge to ablation and had repeat DCCV on 06/02/22. She is s/p afib ablation with Dr Nelly Laurence 06/15/22.  On follow up today, patient reports that she has done well from an afib standpoint. She has not had any episodes detected on her Kardia mobile. She denies chest pain, swallowing pain, or groin issues. She has noticed a gradual increase in her lower extremity edema.   Today, she denies symptoms of palpitations, chest pain, shortness of breath, orthopnea, PND, dizziness, presyncope, syncope, snoring, daytime somnolence, bleeding, or neurologic sequela. The patient is tolerating medications without difficulties and is otherwise without complaint today.    Atrial Fibrillation Risk Factors:  she does have symptoms or diagnosis of sleep apnea. she does not have a history of rheumatic fever.   she has a BMI of Body mass index is 41 kg/m.[name] Kitchen Filed Weights   07/13/22 1518  Weight: 111.8 kg   Family History  Problem Relation Age of Onset   Lung cancer Father    COPD Mother    Hypertension Mother    Melanoma Brother     Alcohol abuse Brother    Other Sister        MGUS     Atrial Fibrillation Management history:  Previous antiarrhythmic drugs: amiodarone  Previous cardioversions: 10/2021, 06/02/22 Previous ablations: 06/15/22 CHADS2VASC score: 4 Anticoagulation history: Eliquis   Past Medical History:  Diagnosis Date   Asthma    COPD (chronic obstructive pulmonary disease) (HCC)    questionable   Degeneration of intervertebral disc, site unspecified    Depression    DJD (degenerative joint disease)    Dyslipidemia    Dysrhythmia    GERD (gastroesophageal reflux disease)    Pneumonia 04/14/2013   Shortness of breath    on exertion   Sleep apnea    stopbang=4   Unspecified essential hypertension    Past Surgical History:  Procedure Laterality Date   APPENDECTOMY     ATRIAL FIBRILLATION ABLATION N/A 06/15/2022   Procedure: ATRIAL FIBRILLATION ABLATION;  Surgeon: Maurice Small, MD;  Location: [facility] INVASIVE CV LAB;  Service: Cardiovascular;  Laterality: N/A;   CARDIOVERSION N/A 10/21/2021   Procedure: CARDIOVERSION;  Surgeon: Antoine Poche, MD;  Location: [facility] ORS;  Service: Endoscopy;  Laterality: N/A;   CARDIOVERSION N/A 06/02/2022   Procedure: CARDIOVERSION;  Surgeon: Parke Poisson, MD;  Location: [facility] ENDOSCOPY;  Service: Cardiovascular;  Laterality: N/A;   ECTOPIC PREGNANCY SURGERY     HERNIA REPAIR     KNEE ARTHROSCOPY  03/14/2008   Right knee   KNEE ARTHROSCOPY  03/14/2010   Left knee   TOTAL KNEE ARTHROPLASTY  11/22/2011  Procedure: TOTAL KNEE ARTHROPLASTY;  Surgeon: Drucilla Schmidt, MD;  Location: [facility] ORS;  Service: Orthopedics;  Laterality: Left;   TOTAL KNEE REVISION Left 09/23/2013   Procedure: REVISION LEFT TOTAL KNEE WITH POLY EXCHANGE UPSIZE AND PATELLA REVISION;  Surgeon: Shelda Pal, MD;  Location: [facility] ORS;  Service: Orthopedics;  Laterality: Left;   UPPER GI ENDOSCOPY N/A 04/13/2021   Procedure: UPPER GI ENDOSCOPY;  Surgeon: Luretha Murphy, MD;  Location: [facility]  ORS;  Service: General;  Laterality: N/A;   XI ROBOTIC ASSISTED HIATAL HERNIA REPAIR N/A 04/13/2021   Procedure: ROBOTIC REPAIR OF LARGE TYPE III HIATAL HERNIA WITH NISSEN, AND GASTROPEXY;  Surgeon: Luretha Murphy, MD;  Location: [facility] ORS;  Service: General;  Laterality: N/A;    Current Outpatient Medications  Medication Sig Dispense Refill   amiodarone (PACERONE) 200 MG tablet Take 1 tablet (200 mg total) by mouth daily. 90 tablet 1   atorvastatin (LIPITOR) 10 MG tablet Take 10 mg by mouth daily.     chlorthalidone (HYGROTON) 25 MG tablet TAKE 1 TABLET(25 MG) BY MOUTH DAILY 90 tablet 0   Cholecalciferol (VITAMIN D) 50 MCG (2000 UT) tablet Take 2,000 Units by mouth daily.     diltiazem (CARDIZEM CD) 180 MG 24 hr capsule Take 1 capsule (180 mg total) by mouth daily. 90 capsule 1   diltiazem (CARDIZEM) 60 MG tablet TAKE 1 TABLET(60 MG) BY MOUTH EVERY 8 HOURS AS NEEDED FOR PALPITATIONS 270 tablet 0   ELIQUIS 5 MG TABS tablet TAKE 1 TABLET(5 MG) BY MOUTH TWICE DAILY 180 tablet 1   furosemide (LASIX) 20 MG tablet Take 20 mg by mouth daily.      lisinopril (ZESTRIL) 40 MG tablet TAKE 1 TABLET(40 MG) BY MOUTH DAILY 90 tablet 0   meloxicam (MOBIC) 15 MG tablet Take 15 mg by mouth at bedtime.      Multiple Vitamin (MULTIVITAMIN WITH MINERALS) TABS tablet Take 1 tablet by mouth daily.     pantoprazole (PROTONIX) 40 MG tablet Take 1 tablet (40 mg total) by mouth daily. 30 tablet 0   potassium chloride SA (KLOR-CON M) 20 MEQ tablet TAKE 2 TABLETS EVERY DAY 180 tablet 1   zinc gluconate 50 MG tablet Take 50 mg by mouth daily.     No current facility-administered medications for this encounter.    Allergies  Allergen Reactions   Ace Inhibitors Diarrhea    Social History   Socioeconomic History   Marital status: Married    Spouse name: Not on file   Number of children: 2   Years of education: Not on file   Highest education level: Not on file  Occupational History   Occupation: Nurse     Employer: [employer]  Tobacco Use   Smoking status: Never    Passive exposure: Yes   Smokeless tobacco: Never   Tobacco comments:    both parents smoked  Vaping Use   Vaping Use: Never used  Substance and Sexual Activity   Alcohol use: Yes    Alcohol/week: 1.0 standard drink of alcohol    Types: 1 Glasses of wine per week    Comment: occasional glass of wine   Drug use: No   Sexual activity: Not Currently    Birth control/protection: Post-menopausal  Other Topics Concern   Not on file  Social History Narrative   Not on file   Social Determinants of Health   Financial Resource Strain: Low Risk  (02/12/2021)   Overall Financial Resource Strain (CARDIA)  Difficulty of Paying Living Expenses: Not hard at all  Food Insecurity: No Food Insecurity (02/12/2021)   Hunger Vital Sign    Worried About Running Out of Food in the Last Year: Never true    Ran Out of Food in the Last Year: Never true  Transportation Needs: No Transportation Needs (02/12/2021)   PRAPARE - Administrator, Civil Service (Medical): No    Lack of Transportation (Non-Medical): No  Physical Activity: Insufficiently Active (02/12/2021)   Exercise Vital Sign    Days of Exercise per Week: 5 days    Minutes of Exercise per Session: 20 min  Stress: Stress Concern Present (02/12/2021)   [employer] of Occupational Health - Occupational Stress Questionnaire    Feeling of Stress : To some extent  Social Connections: Moderately Integrated (02/12/2021)   Social Connection and Isolation Panel [NHANES]    Frequency of Communication with Friends and Family: More than three times a week    Frequency of Social Gatherings with Friends and Family: Three times a week    Attends Religious Services: Never    Active Member of Clubs or Organizations: Yes    Attends Banker Meetings: More than 4 times per year    Marital Status: Married  Catering manager Violence: Not At Risk (02/12/2021)    Humiliation, Afraid, Rape, and Kick questionnaire    Fear of Current or Ex-Partner: No    Emotionally Abused: No    Physically Abused: No    Sexually Abused: No     ROS- All systems are reviewed and negative except as per the HPI above.  Physical Exam: Vitals:   07/13/22 1518  BP: 114/74  Pulse: 65  Weight: 111.8 kg  Height: 5\' 5"  (1.651 m)     GEN- The patient is a well appearing female, alert and oriented x 3 today.   HEENT-head normocephalic, atraumatic, sclera clear, conjunctiva pink, hearing intact, trachea midline. Lungs- Clear to ausculation bilaterally, normal work of breathing Heart- Regular rate and rhythm, no murmurs, rubs or gallops  GI- soft, NT, ND, + BS Extremities- no clubbing, cyanosis, 1-2+ lower extremity edema MS- no significant deformity or atrophy Skin- no rash or lesion Psych- euthymic mood, full affect Neuro- strength and sensation are intact   Wt Readings from Last 3 Encounters:  07/13/22 111.8 kg  06/15/22 104.3 kg  06/07/22 110.1 kg    EKG today demonstrates  SR Vent. rate 65 BPM PR interval 152 ms QRS duration 100 ms QT/QTcB 428/445 ms  Echo 12/04/20 demonstrated   1. Left ventricular ejection fraction, by estimation, is 60 to 65%. The  left ventricle has normal function. The left ventricle has no regional  wall motion abnormalities. Left ventricular diastolic parameters are  indeterminate.   2. Right ventricular systolic function is normal. The right ventricular  size is mildly enlarged. There is moderately elevated pulmonary artery  systolic pressure.   3. Left atrial size was mildly dilated.   4. Right atrial size was mildly dilated.   5. The mitral valve is normal in structure. No evidence of mitral valve  regurgitation. No evidence of mitral stenosis.   6. The tricuspid valve is abnormal.   7. The aortic valve is tricuspid. Aortic valve regurgitation is not  visualized. No aortic stenosis is present.   8. The inferior vena  cava is normal in size with greater than 50%  respiratory variability, suggesting right atrial pressure of 3 mmHg.   Epic records are reviewed at  length today  CHA2DS2-VASc Score = 5  The patient's score is based upon: CHF History: 1 HTN History: 1 Diabetes History: 0 Stroke History: 0 Vascular Disease History: 1 Age Score: 1 Gender Score: 1        ASSESSMENT AND PLAN: 1. Persistent Atrial Fibrillation (ICD10:  I48.19) The patient's CHA2DS2-VASc score is 5, indicating a 7.2% annual risk of stroke.   S/p afib ablation 06/15/22 Patient appears to be maintaining SR.  Continue amiodarone 200 mg daily for now.  Continue diltiazem 180 mg daily with 60 mg BID PRN for heart racing. Continue Eliquis 5 mg BID with no missed doses for 3 months post ablation.   2. Secondary Hypercoagulable State (ICD10:  D68.69) The patient is at significant risk for stroke/thromboembolism based upon her CHA2DS2-VASc Score of 5.  Continue Apixaban (Eliquis).   3. Obesity Body mass index is 41 kg/m. Lifestyle modification was discussed and encouraged including regular physical activity and weight reduction.  4. Suspected obstructive sleep apnea Patient has visit upcoming with Dr Vassie Loll 5/2 to discuss.   5. HTN Stable, no changes today.  6. Chronic HFpEF Repeat echo scheduled 07/18/22 Patient having increased lower extremity edema.  NYHA class II symptoms. Will check BNP Increase Lasix to 40 mg daily x 3 days. Take extra KCL as well.  We did discuss Alleviate HF and she is interested if she meets criteria.   7. CAD CAC score 502 On statin No anginal symptoms.   Follow up with Dr Wyline Mood and Dr Nelly Laurence as scheduled.    Jorja Loa PA-C [facility] [address] [place], [place] [address] [phone] 07/13/2022 4:04 PM

## 2022-07-14 ENCOUNTER — Institutional Professional Consult (permissible substitution): Payer: Medicare HMO | Admitting: Pulmonary Disease

## 2022-07-18 ENCOUNTER — Ambulatory Visit (HOSPITAL_COMMUNITY)
Admission: RE | Admit: 2022-07-18 | Discharge: 2022-07-18 | Disposition: A | Discharge status: Home / Self Care | Payer: Medicare HMO | Source: Ambulatory Visit | Attending: Physician Assistant | Admitting: Physician Assistant

## 2022-07-18 DIAGNOSIS — I509 Heart failure, unspecified: Secondary | ICD-10-CM | POA: Insufficient documentation

## 2022-07-18 DIAGNOSIS — I5032 Chronic diastolic (congestive) heart failure: Secondary | ICD-10-CM

## 2022-07-18 LAB — ECHOCARDIOGRAM COMPLETE
Area-P 1/2: 3.53 cm2
S' Lateral: 2.2 cm

## 2022-07-18 NOTE — Progress Notes (Signed)
*  PRELIMINARY RESULTS* Echocardiogram 2D Echocardiogram has been performed.  Stacey Drain 07/18/2022, 10:08 AM

## 2022-07-27 ENCOUNTER — Encounter: Payer: Self-pay | Admitting: Cardiology

## 2022-07-27 ENCOUNTER — Ambulatory Visit: Payer: Medicare HMO | Admitting: Pulmonary Disease

## 2022-07-27 ENCOUNTER — Ambulatory Visit: Payer: Medicare HMO | Attending: Cardiology | Admitting: Cardiology

## 2022-07-27 ENCOUNTER — Encounter: Payer: Self-pay | Admitting: Pulmonary Disease

## 2022-07-27 VITALS — BP 114/64 | HR 66 | Ht 65.0 in | Wt 244.0 lb

## 2022-07-27 VITALS — BP 97/64 | HR 83 | Ht 65.0 in | Wt 240.0 lb

## 2022-07-27 DIAGNOSIS — J4540 Moderate persistent asthma, uncomplicated: Secondary | ICD-10-CM

## 2022-07-27 DIAGNOSIS — I5032 Chronic diastolic (congestive) heart failure: Secondary | ICD-10-CM

## 2022-07-27 DIAGNOSIS — I10 Essential (primary) hypertension: Secondary | ICD-10-CM

## 2022-07-27 DIAGNOSIS — R0683 Snoring: Secondary | ICD-10-CM

## 2022-07-27 DIAGNOSIS — R0609 Other forms of dyspnea: Secondary | ICD-10-CM | POA: Diagnosis not present

## 2022-07-27 DIAGNOSIS — I4891 Unspecified atrial fibrillation: Secondary | ICD-10-CM

## 2022-07-27 DIAGNOSIS — D6869 Other thrombophilia: Secondary | ICD-10-CM | POA: Diagnosis not present

## 2022-07-27 MED ORDER — FUROSEMIDE 20 MG PO TABS: 20.0000 mg | ORAL_TABLET | Freq: Every day | ORAL | 3 refills | Status: DC

## 2022-07-27 MED ORDER — AZITHROMYCIN 250 MG PO TABS: ORAL_TABLET | ORAL | 0 refills | Status: AC

## 2022-07-27 NOTE — Assessment & Plan Note (Signed)
Will proceed with home sleep testing given her history of loud snoring, nonrefreshing sleep and atrial fibrillation. Pretest probability is intermediate

## 2022-07-27 NOTE — Assessment & Plan Note (Signed)
Obtain spirometry pre and post, previously showed moderate airway obstruction. She has not needed bronchodilator therapy and feels that her breathing is back to normal except for recent exacerbation.  She has prednisone to keep handy should she get worse. Will also send in a prescription for Z-Pak since she is intending to travel in the next month

## 2022-07-27 NOTE — Progress Notes (Signed)
Clinical Summary Kathryn Olsen is a 72 y.o.female seen today for follow up of the following medical problems.  Prevsiously followed by [facility] Card[facility] Dr Rosemary Holms    1.PSVT - noted on prior monitor - from [place] notes has not been started on scehduled av nodal agents due to 3.1 sec asymptomatic pause -- no recent symptoms     2. Afib - new diagnosis during 07/2021 ER visit    -10/2021 DCCV to NSR - recurrent afib  - followed in afib clinic, started on amiodrone. Avoided class IC due to bifascicular block on EKG, she did not want to be admitted to start dofetildie.  - 06/02/22 DCCV to SR - 06/15/22 had afib ablation with Dr Nelly Laurence  - no recent palpitations - no bleeding on eliquis.     3. HFpEF 11/2020 echo LVEF 60-65%, indet diastolic, mild RVE normal with mod pulm HTN.  - 07/18/22 echo: LVEF 70-75%, no WMAs, normal diastolic fxn,normal RV, severe BAE  -5/1 afib clinic appt increased LE edema, was to increase lasix to 40mg  daily x 3 days  -swelling has improved.       4. HTN  - compliant with meds   5. Pulmonary HTN - followed by pulmonary 11/2020 echo PASP 47 - 07/2022 echo normal PASP at 32   6. Elevated coronary calcium score - CAC 502 - she is on statin, no ASA since on anticoag   Has a house outside of [facility], she is from [place]. Often drives down there for some time in the fall.  Past Medical History:  Diagnosis Date   Asthma    COPD (chronic obstructive pulmonary disease) (HCC)    questionable   Degeneration of intervertebral disc, site unspecified    Depression    DJD (degenerative joint disease)    Dyslipidemia    Dysrhythmia    GERD (gastroesophageal reflux disease)    Pneumonia 04/14/2013   Shortness of breath    on exertion   Sleep apnea    stopbang=4   Unspecified essential hypertension      Allergies  Allergen Reactions   Ace Inhibitors Diarrhea     Current Outpatient Medications  Medication Sig Dispense  Refill   amiodarone (PACERONE) 200 MG tablet Take 1 tablet (200 mg total) by mouth daily. 90 tablet 1   atorvastatin (LIPITOR) 10 MG tablet Take 10 mg by mouth daily.     chlorthalidone (HYGROTON) 25 MG tablet TAKE 1 TABLET(25 MG) BY MOUTH DAILY 90 tablet 0   Cholecalciferol (VITAMIN D) 50 MCG (2000 UT) tablet Take 2,000 Units by mouth daily.     diltiazem (CARDIZEM CD) 180 MG 24 hr capsule Take 1 capsule (180 mg total) by mouth daily. 90 capsule 1   diltiazem (CARDIZEM) 60 MG tablet TAKE 1 TABLET(60 MG) BY MOUTH EVERY 8 HOURS AS NEEDED FOR PALPITATIONS 270 tablet 0   ELIQUIS 5 MG TABS tablet TAKE 1 TABLET(5 MG) BY MOUTH TWICE DAILY 180 tablet 1   furosemide (LASIX) 20 MG tablet Take 20 mg by mouth daily.      lisinopril (ZESTRIL) 40 MG tablet TAKE 1 TABLET(40 MG) BY MOUTH DAILY 90 tablet 0   meloxicam (MOBIC) 15 MG tablet Take 15 mg by mouth at bedtime.      Multiple Vitamin (MULTIVITAMIN WITH MINERALS) TABS tablet Take 1 tablet by mouth daily.     pantoprazole (PROTONIX) 40 MG tablet Take 1 tablet (40 mg total) by mouth daily. 30 tablet 0  potassium chloride SA (KLOR-CON M) 20 MEQ tablet TAKE 2 TABLETS EVERY DAY 180 tablet 1   zinc gluconate 50 MG tablet Take 50 mg by mouth daily.     No current facility-administered medications for this visit.     Past Surgical History:  Procedure Laterality Date   APPENDECTOMY     ATRIAL FIBRILLATION ABLATION N/A 06/15/2022   Procedure: ATRIAL FIBRILLATION ABLATION;  Surgeon: Maurice Small, MD;  [facility];  Service: Cardiovascular;  Laterality: N/A;   CARDIOVERSION N/A 10/21/2021   Procedure: CARDIOVERSION;  Surgeon: Antoine Poche, MD;  Location: [facility];  Service: Endoscopy;  Laterality: N/A;   CARDIOVERSION N/A 06/02/2022   Procedure: CARDIOVERSION;  Surgeon: Parke Poisson, MD;  Location: [facility] ENDOSCOPY;  Service: Cardiovascular;  Laterality: N/A;   ECTOPIC PREGNANCY SURGERY     HERNIA REPAIR     KNEE ARTHROSCOPY   03/14/2008   Right knee   KNEE ARTHROSCOPY  03/14/2010   Left knee   TOTAL KNEE ARTHROPLASTY  11/22/2011   Procedure: TOTAL KNEE ARTHROPLASTY;  Surgeon: Drucilla Schmidt, MD;  Location: [facility];  Service: Orthopedics;  Laterality: Left;   TOTAL KNEE REVISION Left 09/23/2013   Procedure: REVISION LEFT TOTAL KNEE WITH POLY EXCHANGE UPSIZE AND PATELLA REVISION;  Surgeon: Shelda Pal, MD;  Location: [facility];  Service: Orthopedics;  Laterality: Left;   UPPER GI ENDOSCOPY N/A 04/13/2021   Procedure: UPPER GI ENDOSCOPY;  Surgeon: Luretha Murphy, MD;  Location: [facility];  Service: General;  Laterality: N/A;   XI ROBOTIC ASSISTED HIATAL HERNIA REPAIR N/A 04/13/2021   Procedure: ROBOTIC REPAIR OF LARGE TYPE III HIATAL HERNIA WITH NISSEN, AND GASTROPEXY;  Surgeon: Luretha Murphy, MD;  Location: [facility];  Service: General;  Laterality: N/A;     Allergies  Allergen Reactions   Ace Inhibitors Diarrhea      Family History  Problem Relation Age of Onset   Lung cancer Father    COPD Mother    Hypertension Mother    Melanoma Brother    Alcohol abuse Brother    Other Sister        MGUS     Social History Kathryn Olsen reports that she has never smoked. She has been exposed to tobacco smoke. She has never used smokeless tobacco. Ms. Primack reports current alcohol use of about 1.0 standard drink of alcohol per week.   Review of Systems CONSTITUTIONAL: No weight loss, fever, chills, weakness or fatigue.  HEENT: Eyes: No visual loss, blurred vision, double vision or yellow sclerae.No hearing loss, sneezing, congestion, runny nose or sore throat.  SKIN: No rash or itching.  CARDIOVASCULAR: per hpi RESPIRATORY: No shortness of breath, cough or sputum.  GASTROINTESTINAL: No anorexia, nausea, vomiting or diarrhea. No abdominal pain or blood.  GENITOURINARY: No burning on urination, no polyuria NEUROLOGICAL: No headache, dizziness, syncope, paralysis, ataxia, numbness or tingling  in the extremities. No change in bowel or bladder control.  MUSCULOSKELETAL: No muscle, back pain, joint pain or stiffness.  LYMPHATICS: No enlarged nodes. No history of splenectomy.  PSYCHIATRIC: No history of depression or anxiety.  ENDOCRINOLOGIC: No reports of sweating, cold or heat intolerance. No polyuria or polydipsia.  [name] Kitchen   Physical Examination Today's Vitals   07/27/22 0918  BP: 114/64  Pulse: 66  SpO2: 95%  Weight: 244 lb (110.7 kg)  Height: 5\' 5"  (1.651 m)   Body mass index is 40.6 kg/m.  Gen: resting comfortably, no acute distress HEENT: no scleral  icterus, pupils equal round and reactive, no palptable cervical adenopathy,  CV: RRR, no mrg, no jvd Resp: Clear to auscultation bilaterally GI: abdomen is soft, non-tender, non-distended, normal bowel sounds, no hepatosplenomegaly MSK: extremities are warm, no edema.  Skin: warm, no rash Neuro:  no focal deficits Psych: appropriate affect   Diagnostic Studies Long term monitor 11 days 11/28/2019 - 12/09/2019: Dominant rhythm: Sinus. HR 35-222 bpm. Avg HR 83 bpm. 595 episodes of SVT, fastest at 147 bpm for 4 min 3 sec, longest for 30 min 51 sec at 125 bpm. 6.3% SVE burden. 1 episode of ventricular tachycardia for 6 beats at 184 bpm <1% VE burden. 1 episode of asymptomatic 3.1 sec sinus pause at 2:01 PM. No atrial fibrillation/atrial flutter/high grade AV block, 1 patient triggered event, correlates with isolated SVE.    Echocardiogram 11/07/2019:  Left ventricle cavity is normal in size. Moderate concentric hypertrophy  of the left ventricle. Normal global wall motion. Normal LV systolic  function with EF 55%. Indeterminate diastolic filling pattern.  Left atrial cavity is severely dilated.  Right atrial cavity is mildly dilated.  Trileaflet aortic valve.  Trace aortic regurgitation.  Mild (Grade I) mitral regurgitation.  Moderate tricuspid regurgitation. Estimated pulmonary artery systolic  pressure 38 mmHg.     EKG 10/14/2019: Sinus rhythm 81 bpm  Borderline left atrial enlargement Incomplete right bundle Kathryn Olsen block Occasional PAC            Assessment and Plan  1.Afib/acquired thrombophilia - s/p ablation, continues to do well - defer duration of amio post ablation to EP - continue anticoagulation   2. HFpEF - euvolemic today, continue lasix. Change to lasix 20mg  daily may take additional 20mg  prn  3.HTN -at goal, continue current meds  4. Elevated coronary calcium score - continue statin   F/u 6 months      Antoine Poche, M.D.

## 2022-07-27 NOTE — Patient Instructions (Signed)
Medication Instructions:  Your physician has recommended you make the following change in your medication:   -Change Lasix to 20 mg tablet once daily. You may take an additional tablet as needed for fluid/swelling.  *If you need a refill on your cardiac medications before your next appointment, please call your pharmacy*   Lab Work: None If you have labs (blood work) drawn today and your tests are completely normal, you will receive your results only by: MyChart Message (if you have MyChart) OR A paper copy in the mail If you have any lab test that is abnormal or we need to change your treatment, we will call you to review the results.   Testing/Procedures: None   Follow-Up: At [facility], you and your health needs are our priority.  As part of our continuing mission to provide you with exceptional heart care, we have created designated Provider Care Teams.  These Care Teams include your primary Cardiologist (physician) and Advanced Practice Providers (APPs -  Physician Assistants and Nurse Practitioners) who all work together to provide you with the care you need, when you need it.  We recommend signing up for the patient portal called "MyChart".  Sign up information is provided on this After Visit Summary.  MyChart is used to connect with patients for Virtual Visits (Telemedicine).  Patients are able to view lab/test results, encounter notes, upcoming appointments, etc.  Non-urgent messages can be sent to your provider as well.   To learn more about what you can do with MyChart, go to [email or web address].    Your next appointment:   6 month(s)  Provider:   Dina Rich, MD    Other Instructions

## 2022-07-27 NOTE — Patient Instructions (Addendum)
X Zpak  X home sleep test  X spirometry pre/post

## 2022-07-27 NOTE — Progress Notes (Signed)
   Subjective:    Patient ID: Kathryn Olsen, female    DOB: [birth year], 72 y.o.   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 We reviewed cardiology consultation, she underwent ablation-she is on apixaban on amiodarone and Cardizem  Echo 07/2022 shows RVSP 32, mildly enlarged RV  She is on lisinopril 40 mg a denies cough. She does snore but denies witnessed apneas She is planning a trip to [place] in June For the last 2 days she reports cough with clear sputum.  She wonders if this has been triggered by some fragrance that she used in the house   Significant tests/ events reviewed 11/2013 PFTs >> ratio 63 with FEV1 of 1.39 L (55% predicted) pre-bronchodilator, postbronchodilator ratio 73 FEV1 2.03 L (80% predicted, 46% change) DLCO of 99% predicted     Echo 11/2020 nml LVEF , RVSP 47 Echo 10/2019 Concentric LVH, moderate TR, RVSP 38   04/2020 Eos 300  Review of Systems neg for any significant sore throat, dysphagia, itching, sneezing, nasal congestion or excess/ purulent secretions, fever, chills, sweats, unintended wt loss, pleuritic or exertional cp, hempoptysis, orthopnea pnd or change in chronic leg swelling. Also denies presyncope, palpitations, heartburn, abdominal pain, nausea, vomiting, diarrhea or change in bowel or urinary habits, dysuria,hematuria, rash, arthralgias, visual complaints, headache, numbness weakness or ataxia.     Objective:   Physical  Exam  Gen. Pleasant, obese, in no distress ENT - no lesions, no post nasal drip Neck: No JVD, no thyromegaly, no carotid bruits Lungs: no use of accessory muscles, no dullness to percussion, decreased without rales or rhonchi  Cardiovascular: Rhythm regular, heart sounds  normal, no murmurs or gallops, no peripheral edema Musculoskeletal: No deformities, no cyanosis or clubbing , no tremors       Assessment & Plan:

## 2022-08-09 ENCOUNTER — Other Ambulatory Visit: Payer: Self-pay | Admitting: Cardiology

## 2022-08-09 DIAGNOSIS — I4891 Unspecified atrial fibrillation: Secondary | ICD-10-CM

## 2022-08-09 NOTE — Telephone Encounter (Signed)
Prescription refill request for Eliquis received. Indication: AF Last office visit: 07/27/22  Dominga Ferry MD Scr: 1.00 on 06/08/22  Epic Age: 72 Weight: 110.7kg  Based on above findings Eliquis 5mg  twice daily is the appropriate dose.  Refill approved.

## 2022-08-11 ENCOUNTER — Other Ambulatory Visit: Payer: Self-pay | Admitting: Cardiology

## 2022-08-11 DIAGNOSIS — I4891 Unspecified atrial fibrillation: Secondary | ICD-10-CM

## 2022-08-18 ENCOUNTER — Encounter: Payer: Self-pay | Admitting: Cardiology

## 2022-08-18 ENCOUNTER — Ambulatory Visit: Payer: Medicare HMO | Attending: Cardiology | Admitting: Cardiology

## 2022-08-18 VITALS — BP 142/70 | HR 70 | Ht 65.0 in | Wt 245.0 lb

## 2022-08-18 DIAGNOSIS — Z006 Encounter for examination for normal comparison and control in clinical research program: Secondary | ICD-10-CM

## 2022-08-18 DIAGNOSIS — I5032 Chronic diastolic (congestive) heart failure: Secondary | ICD-10-CM

## 2022-08-18 DIAGNOSIS — I4821 Permanent atrial fibrillation: Secondary | ICD-10-CM

## 2022-08-18 MED ORDER — FUROSEMIDE 20 MG PO TABS
ORAL_TABLET | ORAL | 3 refills | Status: AC
Start: 2022-08-18 — End: ?

## 2022-08-18 NOTE — Addendum Note (Signed)
Addended by: Dutch Quint B on: 08/18/2022 04:20 PM   Modules accepted: Orders

## 2022-08-18 NOTE — Progress Notes (Signed)
Electrophysiology Office Note   Date:  08/18/2022   ID:  Kathryn Olsen, DOB 1950-10-08, MRN [id number]  PCP:  Iona Hansen, NP  Cardiologist:  Dina Rich Primary Electrophysiologist:  Mealor/Riggs Dineen Jorja Loa, MD    Chief Complaint: AF   History of Present Illness: Kathryn Olsen is a 72 y.o. female who is being seen today for the evaluation of AF at the request of Iona Hansen, NP. Presenting today for electrophysiology evaluation.  She has a history seen for hypertension, diastolic heart failure, COPD, hypertension, pulmonary managing, atrial fibrillation.  She is post atrial fibrillation ablation 06/15/2022 by Dr. Nelly Laurence.  Today she feels well.  She has noted no further episodes of atrial fibrillation.  She has had palpitations but when she checks on her Kardia mobile app, she has remained in sinus rhythm.  She presents today for Linq monitor implant for the alleviate trial.  Today, she denies symptoms of palpitations, chest pain, shortness of breath, orthopnea, PND, lower extremity edema, claudication, dizziness, presyncope, syncope, bleeding, or neurologic sequela. The patient is tolerating medications without difficulties.    Past Medical History:  Diagnosis Date   Asthma    COPD (chronic obstructive pulmonary disease) (HCC)    questionable   Degeneration of intervertebral disc, site unspecified    Depression    DJD (degenerative joint disease)    Dyslipidemia    Dysrhythmia    GERD (gastroesophageal reflux disease)    Pneumonia 04/14/2013   Shortness of breath    on exertion   Sleep apnea    stopbang=4   Unspecified essential hypertension    Past Surgical History:  Procedure Laterality Date   APPENDECTOMY     ATRIAL FIBRILLATION ABLATION N/A 06/15/2022   Procedure: ATRIAL FIBRILLATION ABLATION;  Surgeon: Maurice Small, MD;  [facility];  Service: Cardiovascular;  Laterality: N/A;   CARDIOVERSION N/A 10/21/2021    Procedure: CARDIOVERSION;  Surgeon: Antoine Poche, MD;  Location: [facility];  Service: Endoscopy;  Laterality: N/A;   CARDIOVERSION N/A 06/02/2022   Procedure: CARDIOVERSION;  Surgeon: Parke Poisson, MD;  Location: [facility] ENDOSCOPY;  Service: Cardiovascular;  Laterality: N/A;   ECTOPIC PREGNANCY SURGERY     HERNIA REPAIR     KNEE ARTHROSCOPY  03/14/2008   Right knee   KNEE ARTHROSCOPY  03/14/2010   Left knee   TOTAL KNEE ARTHROPLASTY  11/22/2011   Procedure: TOTAL KNEE ARTHROPLASTY;  Surgeon: Drucilla Schmidt, MD;  Location: [facility];  Service: Orthopedics;  Laterality: Left;   TOTAL KNEE REVISION Left 09/23/2013   Procedure: REVISION LEFT TOTAL KNEE WITH POLY EXCHANGE UPSIZE AND PATELLA REVISION;  Surgeon: Shelda Pal, MD;  Location: [facility];  Service: Orthopedics;  Laterality: Left;   UPPER GI ENDOSCOPY N/A 04/13/2021   Procedure: UPPER GI ENDOSCOPY;  Surgeon: Luretha Murphy, MD;  Location: [facility];  Service: General;  Laterality: N/A;   XI ROBOTIC ASSISTED HIATAL HERNIA REPAIR N/A 04/13/2021   Procedure: ROBOTIC REPAIR OF LARGE TYPE III HIATAL HERNIA WITH NISSEN, AND GASTROPEXY;  Surgeon: Luretha Murphy, MD;  Location: [facility];  Service: General;  Laterality: N/A;     Current Outpatient Medications  Medication Sig Dispense Refill   amiodarone (PACERONE) 200 MG tablet Take 1 tablet (200 mg total) by mouth daily. 90 tablet 1   apixaban (ELIQUIS) 5 MG TABS tablet TAKE 1 TABLET TWICE DAILY 180 tablet 1   atorvastatin (LIPITOR) 10 MG tablet Take 10 mg by mouth daily.  chlorthalidone (HYGROTON) 25 MG tablet TAKE 1 TABLET EVERY DAY 90 tablet 3   Cholecalciferol (VITAMIN D) 50 MCG (2000 UT) tablet Take 2,000 Units by mouth daily.     diltiazem (CARDIZEM CD) 180 MG 24 hr capsule Take 1 capsule (180 mg total) by mouth daily. 90 capsule 1   diltiazem (CARDIZEM) 60 MG tablet TAKE 1 TABLET(60 MG) BY MOUTH EVERY 8 HOURS AS NEEDED FOR PALPITATIONS 270 tablet 0   furosemide (LASIX) 20 MG tablet  Take 1 tablet (20 mg total) by mouth daily. May take an additional tablet daily as needed for fluid/swelling 90 tablet 3   lisinopril (ZESTRIL) 40 MG tablet TAKE 1 TABLET EVERY DAY 90 tablet 3   meloxicam (MOBIC) 15 MG tablet Take 15 mg by mouth every other day.     Multiple Vitamin (MULTIVITAMIN WITH MINERALS) TABS tablet Take 1 tablet by mouth daily.     potassium chloride SA (KLOR-CON M) 20 MEQ tablet TAKE 2 TABLETS EVERY DAY 180 tablet 1   zinc gluconate 50 MG tablet Take 50 mg by mouth daily.     pantoprazole (PROTONIX) 40 MG tablet Take 1 tablet (40 mg total) by mouth daily. 30 tablet 0   No current facility-administered medications for this visit.    Allergies:   Ace inhibitors   Social History:  The patient  reports that she has never smoked. She has been exposed to tobacco smoke. She has never used smokeless tobacco. She reports current alcohol use of about 1.0 standard drink of alcohol per week. She reports that she does not use drugs.   Family History:  The patient's family history includes Alcohol abuse in her brother; COPD in her mother; Hypertension in her mother; Lung cancer in her father; Melanoma in her brother; Other in her sister.    ROS:  Please see the history of present illness.   Otherwise, review of systems is positive for none.   All other systems are reviewed and negative.    PHYSICAL EXAM: VS:  BP (!) 142/70   Pulse 70   Ht 5\' 5"  (1.651 m)   Wt 245 lb (111.1 kg)   SpO2 96%   BMI 40.77 kg/m  , BMI Body mass index is 40.77 kg/m. GEN: Well nourished, well developed, in no acute distress  HEENT: normal  Neck: no JVD, carotid bruits, or masses Cardiac: RRR; no murmurs, rubs, or gallops,no edema  Respiratory:  clear to auscultation bilaterally, normal work of breathing GI: soft, nontender, nondistended, + BS MS: no deformity or atrophy  Skin: warm and dry Neuro:  Strength and sensation are intact Psych: euthymic mood, full affect  EKG:  EKG is not ordered  today. Personal review of the ekg ordered 07/13/22 shows sinus rhythm  Recent Labs: 09/09/2021: Magnesium 2.5 05/16/2022: ALT 20; TSH 2.232 06/08/2022: BUN 23; Creatinine, Ser 1.00; Hemoglobin 16.9; Platelets 226; Potassium 3.9; Sodium 138 07/13/2022: B Natriuretic Peptide 158.8    Lipid Panel  No results found for: "CHOL", "TRIG", "HDL", "CHOLHDL", "VLDL", "LDLCALC", "LDLDIRECT"   Wt Readings from Last 3 Encounters:  08/18/22 245 lb (111.1 kg)  07/27/22 240 lb (108.9 kg)  07/27/22 244 lb (110.7 kg)      Other studies Reviewed: Additional studies/ records that were reviewed today include: TTE 07/18/22  Review of the above records today demonstrates:   1. Left ventricular ejection fraction, by estimation, is 70 to 75%. The  left ventricle has hyperdynamic function. The left ventricle has no  regional wall motion  abnormalities. There is mild left ventricular  hypertrophy. Left ventricular diastolic  parameters were normal.   2. Right ventricular systolic function is normal. The right ventricular  size is mildly enlarged. There is normal pulmonary artery systolic  pressure.   3. Left atrial size was severely dilated.   4. Right atrial size was severely dilated.   5. The mitral valve is normal in structure. Trivial mitral valve  regurgitation.   6. The aortic valve is tricuspid. Aortic valve regurgitation is not  visualized. Aortic valve sclerosis is present, with no evidence of aortic  valve stenosis.   7. The inferior vena cava is normal in size with greater than 50%  respiratory variability, suggesting right atrial pressure of 3 mmHg.    ASSESSMENT AND PLAN:  1.  Persistent atrial fibrillation: CHA2DS2-VASc of 5.  Post ablation 06/15/2022.  Currently on amiodarone, diltiazem, Eliquis.  She presents for Linq monitor implant for the alleviate failure study.  Risk and benefits have been discussed.  Risk of bleeding, infection.  She understands the risks and is agreed to the  procedure.  2.  Obesity: Lifestyle modification encouraged  3.  Obstructive sleep apnea: CPAP compliance encouraged  4.  Chronic diastolic heart failure: Plan for alleviate study implant.  Otherwise plan per primary cardiology.    Current medicines are reviewed at length with the patient today.   The patient does not have concerns regarding her medicines.  The following changes were made today:  none  Labs/ tests ordered today include:  No orders of the defined types were placed in this encounter.    Disp Signed, [place] Jeff Jorja Loa, MD  08/18/2022 1:22 PM     [facility] [address] [place] [address] [phone] (office) [phone] (fax)   SURGEON:  Iram Astorino Jorja Loa, MD     PREPROCEDURE DIAGNOSIS:  Atrial fibrillation    POSTPROCEDURE DIAGNOSIS: Atrial fibrillation     PROCEDURES:   1. Implantable loop recorder implantation    INTRODUCTION:  Jakirah Willenbrink presents with a history of atrial fibrillation The costs of loop recorder monitoring have been discussed with the patient. Appropriate time out, witnessed by Diamond Nickel, was performed prior to the procedure.    DESCRIPTION OF PROCEDURE:  Informed written consent was obtained.  The patient required no sedation for the procedure today.  Mapping over the patient's chest was performed to identify the area where electrograms were most prominent for ILR recording.  This area was found to be the left parasternal region over the 4th intercostal space. The patients left chest was therefore prepped and draped in the usual sterile fashion. The skin overlying the left parasternal region was infiltrated with lidocaine for local analgesia.  A 0.5-cm incision was made over the left parasternal region over the 3rd intercostal space.  A subcutaneous ILR pocket was fashioned using a combination of sharp and blunt dissection.  A [employer] Reveal LINQ (serial # [id number]) implantable loop  recorder was then placed into the pocket  R waves were very prominent and measured 0.68mV.  Steri- Strips and a sterile dressing were then applied.  There were no early apparent complications.     CONCLUSIONS:   1. Successful implantation of a implantable loop recorder for a history of atrial fibrillation  2. No early apparent complications.   Christin Moline Jorja Loa, MD  08/18/2022 1:22 PM

## 2022-08-18 NOTE — Patient Instructions (Addendum)
Medication Instructions:  Your physician recommends that you continue on your current medications as directed. Please refer to the Current Medication list given to you today.  Labwork: None ordered.  Testing/Procedures: None ordered.  Follow-Up: Keep July appointment with Dr York Pellant     Implantable Loop Recorder Placement, Care After This sheet gives you information about how to care for yourself after your procedure. Your health care provider may also give you more specific instructions. If you have problems or questions, contact your health care provider. What can I expect after the procedure? After the procedure, it is common to have: Soreness or discomfort near the incision. Some swelling or bruising near the incision.  Follow these instructions at home: Incision care  Monitor your cardiac device site for redness, swelling, and drainage. Call the device clinic at [phone] if you experience these symptoms or fever/chills.  Keep the large square bandage on your site for 24 hours and then you may remove it yourself. Keep the steri-strips underneath in place.   You may shower after 72 hours / 3 days from your procedure with the steri-strips in place. They will usually fall off on their own, or may be removed after 10 days. Pat dry.   Avoid lotions, ointments, or perfumes over your incision until it is well-healed.  Please do not submerge in water until your site is completely healed.   Your device is MRI compatible.   Remote monitoring is used to monitor your cardiac device from home. This monitoring is scheduled every month by our office. It allows [place] to keep an eye on the function of your device to ensure it is working properly.  If your wound site starts to bleed apply pressure.    For help with the monitor please call [employer] Monitor Support Specialist directly at [phone].    If you have any questions/concerns please call the device clinic at  [phone].  Activity  Return to your normal activities.  General instructions Follow instructions from your health care provider about how to manage your implantable loop recorder and transmit the information. Learn how to activate a recording if this is necessary for your type of device. You may go through a metal detection gate, and you may let someone hold a metal detector over your chest. Show your ID card if needed. Do not have an MRI unless you check with your health care provider first. Take over-the-counter and prescription medicines only as told by your health care provider. Keep all follow-up visits as told by your health care provider. This is important. Contact a health care provider if: You have redness, swelling, or pain around your incision. You have a fever. You have pain that is not relieved by your pain medicine. You have triggered your device because of fainting (syncope) or because of a heartbeat that feels like it is racing, slow, fluttering, or skipping (palpitations). Get help right away if you have: Chest pain. Difficulty breathing. Summary After the procedure, it is common to have soreness or discomfort near the incision. Change your dressing as told by your health care provider. Follow instructions from your health care provider about how to manage your implantable loop recorder and transmit the information. Keep all follow-up visits as told by your health care provider. This is important. This information is not intended to replace advice given to you by your health care provider. Make sure you discuss any questions you have with your health care provider. Document Released: 02/09/2015 Document Revised: 04/15/2017 Document  Reviewed: 04/15/2017 Elsevier Patient Education  [employer].

## 2022-08-19 NOTE — Research (Signed)
Alleviate HF Informed Consent   Subject Name: Kathryn Olsen  Subject met inclusion and exclusion criteria.  The informed consent form, study requirements and expectations were reviewed with the subject and questions and concerns were addressed prior to the signing of the consent form.  The subject verbalized understanding of the trial requirements.  The subject agreed to participate in the Alleviate HF trial and signed the informed consent on 08/18/2022.  The informed consent was obtained prior to performance of any protocol-specific procedures for the subject.  A copy of the signed informed consent was given to the subject and a copy was placed in the subject's medical record.   Cutler Sunday    Reviewed of history and physical by investigator.   INCLUSION [x]  []  Patient has NYHA Class II or III heart failure per most recent assessment, irrespective of left ventricular ejection fraction (LVEF)  [x]  []  Patient has documented recent history of symptomatic heart failure, defined as meeting any one of the following three criteria: 1. Hospital admission with primary diagnosis of HF within the last 12 months, OR 2. Intravenous HF therapy (e.g. IV diuretics/vasodilators) or ultrafiltration within the last 6 months, OR 3. Patient had the following BNP/NT-proBNP6 within the last 3 months: - If LVEF ? 50%, then BNP> 150 pg/ml or NT-proBNP > 450 pg/ml OR - If LVEF is <50%, then BNP> 300 pg/ml or NT-proBNP > 900 pg/ml  [x]  []  Patient is willing and able to comply with the protocol, including LINQ ICM insertion, CareLink transmissions (including adequate connectivity), study visits and remote care directions  [x]  []  Patient is 74 years of age or older  [x]  []  Patient has a life expectancy of 12 months or more    EXCLUSION []  [x]  Patient is currently implanted with a cardiovascular implantable electronic device (CIED)7 (e.g. ICM8, pacemaker, ICD, CRT-D or CRT-P device) or hemodynamic monitor   []   [x]  Patient is receiving temporary or permanent mechanical circulatory support   []  [x]  Patient had MI or PCI/CABG within past 90 days   []  [x]  Patient has had a heart transplant, or is currently on heart transplant list   []  [x]  Patient has severe valve stenosis on echocardiogram   []  [x]  Patient has primary pulmonary hypertension (pre-capillary, WHO group 1,3,4,5)   []  [x]  Patient is on chronic intravenous inotropic drug therapy (e.g. dobutamine, milrinone)   []  [x]  Patient has severe renal impairment (eGFR <30 mL/min)   []  [x]  Patient has systolic blood pressure of < 90 mmHg at the time of enrollment   []  [x]  Patient is on chronic renal dialysis   []  [x]  Patient is unable to undergo one round of PRN medication intervention (i.e. 4 days of increased diuretics dose), based on the judgement of the investigator (e.g. known history of side effects or intolerance to PRN dosing of diuretics)   []  [x]  Patient has liver disease, defined as AST/ALT >5x normal, or bilirubin >2x normal   []  [x]  Patient has serum albumin < 3 g/dL   []  [x]  Patient has hypertrophic obstructive cardiomyopathy, constrictive pericarditis or amyloidosis   []  [x]   Patient has complex adult congenital heart disease   []  [x]  Patient has active cancer involving chemotherapy and/or radiation therapy   []  [x]  Patient weighs more than 500 pounds  []  [x]  Patient is pregnant (all females of child-bearing potential must have a negative pregnancy test within 1 week of enrollment)   Labs drawn 6 MHWT EQ 5D 5L completed   Implant Successful Randomization complete

## 2022-08-20 LAB — BRAIN NATRIURETIC PEPTIDE: BNP: 92.1 pg/mL (ref 0.0–100.0)

## 2022-08-20 LAB — COMPREHENSIVE METABOLIC PANEL
ALT: 21 IU/L (ref 0–32)
AST: 23 IU/L (ref 0–40)
Albumin/Globulin Ratio: 1.9 (ref 1.2–2.2)
Albumin: 4.3 g/dL (ref 3.8–4.8)
Alkaline Phosphatase: 97 IU/L (ref 44–121)
BUN/Creatinine Ratio: 25 (ref 12–28)
BUN: 23 mg/dL (ref 8–27)
Bilirubin Total: 0.4 mg/dL (ref 0.0–1.2)
CO2: 22 mmol/L (ref 20–29)
Calcium: 9.6 mg/dL (ref 8.7–10.3)
Chloride: 101 mmol/L (ref 96–106)
Creatinine, Ser: 0.92 mg/dL (ref 0.57–1.00)
Globulin, Total: 2.3 g/dL (ref 1.5–4.5)
Glucose: 90 mg/dL (ref 70–99)
Potassium: 4.4 mmol/L (ref 3.5–5.2)
Sodium: 139 mmol/L (ref 134–144)
Total Protein: 6.6 g/dL (ref 6.0–8.5)
eGFR: 65 mL/min/{1.73_m2} (ref >59)

## 2022-08-20 LAB — CBC
Hematocrit: 48.3 % — ABNORMAL HIGH (ref 34.0–46.6)
Hemoglobin: 15.9 g/dL (ref 11.1–15.9)
MCH: 30.3 pg (ref 26.6–33.0)
MCHC: 32.9 g/dL (ref 31.5–35.7)
MCV: 92 fL (ref 79–97)
Platelets: 261 10*3/uL (ref 150–450)
RBC: 5.25 x10E6/uL (ref 3.77–5.28)
RDW: 12.9 % (ref 11.7–15.4)
WBC: 11.4 10*3/uL — ABNORMAL HIGH (ref 3.4–10.8)

## 2022-08-22 NOTE — Progress Notes (Signed)
Alleviate labs for review.

## 2022-09-20 ENCOUNTER — Encounter: Payer: Self-pay | Admitting: Cardiovascular Disease

## 2022-09-20 ENCOUNTER — Ambulatory Visit: Payer: Medicare HMO | Admitting: Cardiovascular Disease

## 2022-09-20 VITALS — BP 122/82 | HR 54 | Ht 65.0 in | Wt 243.6 lb

## 2022-09-20 DIAGNOSIS — I4821 Permanent atrial fibrillation: Secondary | ICD-10-CM

## 2022-09-20 NOTE — Progress Notes (Signed)
Electrophysiology Office Note:    Date:  09/20/2022   ID:  Kathryn Olsen, DOB 1950-12-08, MRN [id number]  PCP:  Iona Hansen, NP   [facility] Providers Cardiologist:  Dina Rich, MD Electrophysiologist:  Maurice Small, MD     Referring MD: Iona Hansen, NP   History of Present Illness:    Kathryn Olsen is a 72 y.o. female with a hx listed below, significant for persistent atrial fibrillation, CHFpEF, pulmonary hypertension, COPD, hypertension referred for arrhythmia management.  She was diagnosed with atrial fibrillation in May 2023 after presenting to the ER with chest discomfort.  DC cardioversion was performed in August 2023.  In follow-up in March 2024, she was noted to have had recurrence of atrial fibrillation which, according to her cardia mobile device have been persistent since October 2023.  She was started on amiodarone and underwent DC cardioversion on March 6.  She She feels much better after the cardioversion.  Her shortness of breath and fatigue are significantly improved.    She underwent atrial fibrillation ablation in April 2024.  The ablation was uncomplicated   Past Medical History:  Diagnosis Date   Asthma    COPD (chronic obstructive pulmonary disease) (HCC)    questionable   Degeneration of intervertebral disc, site unspecified    Depression    DJD (degenerative joint disease)    Dyslipidemia    Dysrhythmia    GERD (gastroesophageal reflux disease)    Pneumonia 04/14/2013   Shortness of breath    on exertion   Sleep apnea    stopbang=4   Unspecified essential hypertension     Past Surgical History:  Procedure Laterality Date   APPENDECTOMY     ATRIAL FIBRILLATION ABLATION N/A 06/15/2022   Procedure: ATRIAL FIBRILLATION ABLATION;  Surgeon: Maurice Small, MD;  Location: [facility] INVASIVE CV [facility];  Service: Cardiovascular;  Laterality: N/A;   CARDIOVERSION N/A 10/21/2021   Procedure: CARDIOVERSION;  Surgeon:  Antoine Poche, MD;  Location: [facility] ORS;  Service: Endoscopy;  Laterality: N/A;   CARDIOVERSION N/A 06/02/2022   Procedure: CARDIOVERSION;  Surgeon: Parke Poisson, MD;  Location: [facility] ENDOSCOPY;  Service: Cardiovascular;  Laterality: N/A;   ECTOPIC PREGNANCY SURGERY     HERNIA REPAIR     KNEE ARTHROSCOPY  03/14/2008   Right knee   KNEE ARTHROSCOPY  03/14/2010   Left knee   TOTAL KNEE ARTHROPLASTY  11/22/2011   Procedure: TOTAL KNEE ARTHROPLASTY;  Surgeon: Drucilla Schmidt, MD;  Location: [facility] ORS;  Service: Orthopedics;  Laterality: Left;   TOTAL KNEE REVISION Left 09/23/2013   Procedure: REVISION LEFT TOTAL KNEE WITH POLY EXCHANGE UPSIZE AND PATELLA REVISION;  Surgeon: Shelda Pal, MD;  Location: [facility] ORS;  Service: Orthopedics;  Laterality: Left;   UPPER GI ENDOSCOPY N/A 04/13/2021   Procedure: UPPER GI ENDOSCOPY;  Surgeon: Luretha Murphy, MD;  Location: [facility] ORS;  Service: General;  Laterality: N/A;   XI ROBOTIC ASSISTED HIATAL HERNIA REPAIR N/A 04/13/2021   Procedure: 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   apixaban (ELIQUIS) 5 MG TABS tablet TAKE 1 TABLET TWICE DAILY   atorvastatin (LIPITOR) 10 MG tablet Take 10 mg by mouth daily.   chlorthalidone (HYGROTON) 25 MG  tablet TAKE 1 TABLET EVERY DAY   Cholecalciferol (VITAMIN D) 50 MCG (2000 UT) tablet Take 2,000 Units by mouth daily.   diltiazem (CARDIZEM CD) 180 MG 24 hr capsule Take 1 capsule (180 mg total) by mouth daily.   diltiazem (CARDIZEM) 60 MG tablet TAKE 1 TABLET(60 MG) BY MOUTH EVERY 8 HOURS AS NEEDED FOR PALPITATIONS   furosemide (LASIX) 20 MG tablet Take 1 tablet (20 mg total) by mouth daily. As needed pt may take additional 20 MG Lasix by mouth as needed if  directed by Alleviate HF Study x 4 days. Do not remove prescription.   lisinopril (ZESTRIL) 40 MG tablet TAKE 1 TABLET EVERY DAY   meloxicam (MOBIC) 15 MG tablet Take 15 mg by mouth every other day.   Multiple Vitamin (MULTIVITAMIN WITH MINERALS) TABS tablet Take 1 tablet by mouth daily.   potassium chloride SA (KLOR-CON M) 20 MEQ tablet TAKE 2 TABLETS EVERY DAY   zinc gluconate 50 MG tablet Take 50 mg by mouth daily.     Allergies:   Ace inhibitors   Social and Family History: Reviewed in Epic  ROS:   Please see the history of present illness.    All other systems reviewed and are negative.  EKGs/Labs/Other Studies Reviewed Today:    Echocardiogram:  TTE - 12/04/2020 EF 60-65%. Mildly dilated LA   Monitors:   Stress testing:    Advanced imaging:   EKG:  Last EKG results: today - sinus rhythm, IRBBB, LAFB   Recent Labs: 05/16/2022: TSH 2.232 08/18/2022: ALT 21; BNP 92.1; BUN 23; Creatinine, Ser 0.92; Hemoglobin 15.9; Platelets 261; Potassium 4.4; Sodium 139     Physical Exam:    VS:  BP 122/82   Pulse (!) 54   Ht 5\' 5"  (1.651 m)   Wt 243 lb 9.6 oz (110.5 kg)   SpO2 97%   BMI 40.54 kg/m     Wt Readings from Last 3 Encounters:  09/20/22 243 lb 9.6 oz (110.5 kg)  08/18/22 245 lb (111.1 kg)  07/27/22 240 lb (108.9 kg)     GEN:  Well nourished, well developed in no acute distress CARDIAC: RRR, no murmurs, rubs, gallops RESPIRATORY:  Normal work of breathing MUSCULOSKELETAL: no edema    ASSESSMENT & PLAN:    Persistent atrial fibrillation Symptomatic.  Very well since ablation without evidence of recurrence Discontinue amiodarone today   Hypercoagulable state due to atrial fibrillation Continue apixaban 5 mg          Medication Adjustments/Labs and Tests Ordered: Current medicines are reviewed at length with the patient today.  Concerns regarding medicines are outlined above.  Orders Placed This Encounter  Procedures   EKG 12-Lead   No  orders of the defined types were placed in this encounter.    Signed, Maurice Small, MD  09/20/2022 2:28 PM    [facility]

## 2022-09-20 NOTE — Patient Instructions (Addendum)
Medication Instructions:  STOP Amiodarone *If you need a refill on your cardiac medications before your next appointment, please call your pharmacy*   Follow-Up: At [facility], you and your health needs are our priority.  As part of our continuing mission to provide you with exceptional heart care, we have created designated Provider Care Teams.  These Care Teams include your primary Cardiologist (physician) and Advanced Practice Providers (APPs -  Physician Assistants and Nurse Practitioners) who all work together to provide you with the care you need, when you need it.  We recommend signing up for the patient portal called "MyChart".  Sign up information is provided on this After Visit Summary.  MyChart is used to connect with patients for Virtual Visits (Telemedicine).  Patients are able to view lab/test results, encounter notes, upcoming appointments, etc.  Non-urgent messages can be sent to your provider as well.   To learn more about what you can do with MyChart, go to [email or web address].    Your next appointment:   6 month(s)  Provider:   Augustus Mealor, MD  

## 2022-09-21 ENCOUNTER — Other Ambulatory Visit (HOSPITAL_COMMUNITY): Payer: Medicare HMO

## 2022-10-19 DIAGNOSIS — Z006 Encounter for examination for normal comparison and control in clinical research program: Secondary | ICD-10-CM

## 2022-10-19 NOTE — Research (Signed)
Alleviate HF Research Study  2 Month f/u  No adverse events or cardiovascular medication changes to report at this time.   Current Outpatient Medications:    apixaban (ELIQUIS) 5 MG TABS tablet, TAKE 1 TABLET TWICE DAILY, Disp: 180 tablet, Rfl: 1   atorvastatin (LIPITOR) 10 MG tablet, Take 10 mg by mouth daily., Disp: , Rfl:    chlorthalidone (HYGROTON) 25 MG tablet, TAKE 1 TABLET EVERY DAY, Disp: 90 tablet, Rfl: 3   Cholecalciferol (VITAMIN D) 50 MCG (2000 UT) tablet, Take 2,000 Units by mouth daily., Disp: , Rfl:    diltiazem (CARDIZEM CD) 180 MG 24 hr capsule, Take 1 capsule (180 mg total) by mouth daily., Disp: 90 capsule, Rfl: 1   diltiazem (CARDIZEM) 60 MG tablet, TAKE 1 TABLET(60 MG) BY MOUTH EVERY 8 HOURS AS NEEDED FOR PALPITATIONS, Disp: 270 tablet, Rfl: 0   furosemide (LASIX) 20 MG tablet, Take 1 tablet (20 mg total) by mouth daily. As needed pt may take additional 20 MG Lasix by mouth as needed if directed by Alleviate HF Study x 4 days. Do not remove prescription., Disp: 90 tablet, Rfl: 3   lisinopril (ZESTRIL) 40 MG tablet, TAKE 1 TABLET EVERY DAY, Disp: 90 tablet, Rfl: 3   meloxicam (MOBIC) 15 MG tablet, Take 15 mg by mouth every other day., Disp: , Rfl:    Multiple Vitamin (MULTIVITAMIN WITH MINERALS) TABS tablet, Take 1 tablet by mouth daily., Disp: , Rfl:    pantoprazole (PROTONIX) 40 MG tablet, Take 1 tablet (40 mg total) by mouth daily., Disp: 30 tablet, Rfl: 0   potassium chloride SA (KLOR-CON M) 20 MEQ tablet, TAKE 2 TABLETS EVERY DAY, Disp: 180 tablet, Rfl: 1   zinc gluconate 50 MG tablet, Take 50 mg by mouth daily., Disp: , Rfl:

## 2022-10-25 ENCOUNTER — Ambulatory Visit (HOSPITAL_COMMUNITY)
Admission: RE | Admit: 2022-10-25 | Discharge: 2022-10-25 | Disposition: A | Discharge status: Home / Self Care | Payer: Medicare HMO | Source: Ambulatory Visit | Attending: Pulmonary Disease | Admitting: Pulmonary Disease

## 2022-10-25 DIAGNOSIS — J4540 Moderate persistent asthma, uncomplicated: Secondary | ICD-10-CM | POA: Diagnosis present

## 2022-10-25 DIAGNOSIS — R0609 Other forms of dyspnea: Secondary | ICD-10-CM | POA: Insufficient documentation

## 2022-10-25 LAB — SPIROMETRY WITH GRAPH
FEF 25-75 Pre: 0.74 L/sec
FEF2575-%Pred-Pre: 41 %
FEV1-%Pred-Pre: 56 %
FEV1-Pre: 1.29 L
FEV1FVC-%Pred-Pre: 83 %
FEV6-%Pred-Pre: 70 %
FEV6-Pre: 2.03 L
FEV6FVC-%Pred-Pre: 103 %
FVC-%Pred-Pre: 68 %
FVC-Pre: 2.06 L
Pre FEV1/FVC ratio: 63 %
Pre FEV6/FVC Ratio: 99 %

## 2022-11-03 ENCOUNTER — Other Ambulatory Visit (HOSPITAL_COMMUNITY): Payer: Self-pay | Admitting: Nurse Practitioner

## 2022-11-03 DIAGNOSIS — Z1231 Encounter for screening mammogram for malignant neoplasm of breast: Secondary | ICD-10-CM

## 2022-11-04 ENCOUNTER — Telehealth: Payer: Self-pay | Admitting: Cardiology

## 2022-11-04 NOTE — Telephone Encounter (Signed)
I spoke with patient and she says spouse has Covid and is feeling better after starting Paxlovid yesterday. She is going to call her pcp and inquire about medication for herself as she tested positive 2 days ago.  Her current VS are BP 104/81, HR 74. She is staying hydrated and eating. She will continue to monitor her symptoms as she had noted low BP last night.   Dr.Branch,does patient need to adjust her lisinopril dosing?

## 2022-11-04 NOTE — Telephone Encounter (Signed)
Pt c/o BP issue: STAT if pt c/o blurred vision, one-sided weakness or slurred speech  1. What are your last 5 BP readings? 85/56  2. Are you having any other symptoms (ex. Dizziness, headache, blurred vision, passed out)? Dizziness, lightheaded and stuffy nose  3. What is your BP issue? Pt is requesting a callback regarding her having covid but also concerned about her BP. Please advise

## 2022-11-07 NOTE — Telephone Encounter (Addendum)
I spoke with patient for update. She states her PCP would not give her Paxlovid because she was on a Eliquis. She had temps over 101 for several days, it is now 99. Her cough went from clear to yellow sputum,she still has sinus congestion and headache. I asked her to call pcp back.  She said her BP is essentially back to baseline and she is monitoring her O2 sats. She said she is still have occasional dizziness.

## 2022-11-08 ENCOUNTER — Telehealth: Payer: Self-pay | Admitting: Pulmonary Disease

## 2022-11-08 ENCOUNTER — Encounter (INDEPENDENT_AMBULATORY_CARE_PROVIDER_SITE_OTHER): Payer: Medicare HMO

## 2022-11-08 ENCOUNTER — Encounter (HOSPITAL_BASED_OUTPATIENT_CLINIC_OR_DEPARTMENT_OTHER): Payer: Self-pay

## 2022-11-08 DIAGNOSIS — R0683 Snoring: Secondary | ICD-10-CM

## 2022-11-08 DIAGNOSIS — G4733 Obstructive sleep apnea (adult) (pediatric): Secondary | ICD-10-CM

## 2022-11-08 NOTE — Telephone Encounter (Signed)
Message routed to pt via MyChart.

## 2022-11-08 NOTE — Telephone Encounter (Signed)
HST showed mild OSA with AHI 11/ hr but sat drop was severe with low sat of 71% Suggest autoCPAP  5-15 cm, mask of choice OV with me/APP in 6 wks after starting CPAP

## 2022-11-09 NOTE — Telephone Encounter (Signed)
Spoke with patient regarding results and recommendations. Patient voiced understanding. She declines CPAP at this time. She is interested in her low O2 sats. Would an ONO be an option for her?   Please advise, thank you!

## 2022-11-10 ENCOUNTER — Ambulatory Visit (HOSPITAL_COMMUNITY): Payer: Medicare HMO

## 2022-11-10 NOTE — Telephone Encounter (Signed)
Dr Vassie Loll, you do not have anything at [facility] until mid-November and the apps are not currently scheduled to be there at all. They are booked at [facility] until 10/3. Are you okay for patient to wait to be seen at [facility]?

## 2022-11-17 ENCOUNTER — Other Ambulatory Visit (HOSPITAL_COMMUNITY): Payer: Self-pay | Admitting: Nurse Practitioner

## 2022-11-17 ENCOUNTER — Ambulatory Visit (HOSPITAL_COMMUNITY)
Admission: RE | Admit: 2022-11-17 | Discharge: 2022-11-17 | Disposition: A | Discharge status: Home / Self Care | Payer: Medicare HMO | Source: Ambulatory Visit | Attending: Nurse Practitioner | Admitting: Nurse Practitioner

## 2022-11-17 ENCOUNTER — Ambulatory Visit (HOSPITAL_COMMUNITY): Payer: Medicare HMO

## 2022-11-17 DIAGNOSIS — M4310 Spondylolisthesis, site unspecified: Secondary | ICD-10-CM | POA: Diagnosis not present

## 2022-11-17 DIAGNOSIS — E785 Hyperlipidemia, unspecified: Secondary | ICD-10-CM | POA: Diagnosis not present

## 2022-11-17 DIAGNOSIS — M8589 Other specified disorders of bone density and structure, multiple sites: Secondary | ICD-10-CM | POA: Diagnosis not present

## 2022-11-17 DIAGNOSIS — Z780 Asymptomatic menopausal state: Secondary | ICD-10-CM | POA: Insufficient documentation

## 2022-11-17 DIAGNOSIS — Z1231 Encounter for screening mammogram for malignant neoplasm of breast: Secondary | ICD-10-CM | POA: Diagnosis not present

## 2022-11-17 DIAGNOSIS — M85859 Other specified disorders of bone density and structure, unspecified thigh: Secondary | ICD-10-CM | POA: Diagnosis not present

## 2022-11-17 DIAGNOSIS — Z79899 Other long term (current) drug therapy: Secondary | ICD-10-CM | POA: Diagnosis not present

## 2022-11-17 DIAGNOSIS — R52 Pain, unspecified: Secondary | ICD-10-CM

## 2022-11-17 DIAGNOSIS — I878 Other specified disorders of veins: Secondary | ICD-10-CM | POA: Diagnosis not present

## 2022-11-17 DIAGNOSIS — R739 Hyperglycemia, unspecified: Secondary | ICD-10-CM | POA: Diagnosis not present

## 2022-11-17 DIAGNOSIS — M5136 Other intervertebral disc degeneration, lumbar region: Secondary | ICD-10-CM | POA: Diagnosis not present

## 2022-11-17 DIAGNOSIS — Z13820 Encounter for screening for osteoporosis: Secondary | ICD-10-CM | POA: Insufficient documentation

## 2022-11-17 DIAGNOSIS — I5022 Chronic systolic (congestive) heart failure: Secondary | ICD-10-CM | POA: Diagnosis not present

## 2022-11-17 DIAGNOSIS — I4891 Unspecified atrial fibrillation: Secondary | ICD-10-CM | POA: Diagnosis not present

## 2022-11-17 DIAGNOSIS — M5126 Other intervertebral disc displacement, lumbar region: Secondary | ICD-10-CM | POA: Diagnosis not present

## 2022-11-17 DIAGNOSIS — I110 Hypertensive heart disease with heart failure: Secondary | ICD-10-CM | POA: Diagnosis not present

## 2022-11-17 DIAGNOSIS — M85851 Other specified disorders of bone density and structure, right thigh: Secondary | ICD-10-CM | POA: Diagnosis not present

## 2022-11-17 DIAGNOSIS — I10 Essential (primary) hypertension: Secondary | ICD-10-CM | POA: Diagnosis not present

## 2022-11-17 DIAGNOSIS — M545 Low back pain, unspecified: Secondary | ICD-10-CM | POA: Diagnosis not present

## 2022-12-02 DIAGNOSIS — H52223 Regular astigmatism, bilateral: Secondary | ICD-10-CM | POA: Diagnosis not present

## 2022-12-02 DIAGNOSIS — H524 Presbyopia: Secondary | ICD-10-CM | POA: Diagnosis not present

## 2022-12-02 DIAGNOSIS — H5203 Hypermetropia, bilateral: Secondary | ICD-10-CM | POA: Diagnosis not present

## 2022-12-08 ENCOUNTER — Encounter: Payer: Self-pay | Admitting: Nurse Practitioner

## 2022-12-08 ENCOUNTER — Ambulatory Visit: Payer: Medicare HMO | Admitting: Nurse Practitioner

## 2022-12-08 VITALS — BP 100/70 | HR 108 | Ht 66.0 in | Wt 241.0 lb

## 2022-12-08 DIAGNOSIS — J4530 Mild persistent asthma, uncomplicated: Secondary | ICD-10-CM | POA: Diagnosis not present

## 2022-12-08 DIAGNOSIS — J301 Allergic rhinitis due to pollen: Secondary | ICD-10-CM | POA: Diagnosis not present

## 2022-12-08 DIAGNOSIS — J309 Allergic rhinitis, unspecified: Secondary | ICD-10-CM | POA: Insufficient documentation

## 2022-12-08 DIAGNOSIS — G4733 Obstructive sleep apnea (adult) (pediatric): Secondary | ICD-10-CM | POA: Diagnosis not present

## 2022-12-08 NOTE — Progress Notes (Signed)
@Patient  ID: Kathryn Olsen, female    DOB: March 23, 1950, 72 y.o.   MRN: [id number]  Chief Complaint  Patient presents with   Follow-up    Pt is here for Asthma/Sleep Study F/U visit. SOB on exertion per pt.    Referring provider: Iona Hansen, NP  HPI: 72 year old female, never smoker followed for asthma and OSA.  She is a retired Engineer, civil (consulting).  She is a patient Dr. Reginia Naas and last seen in office 07/27/2022.  Past medical history significant for hypertension, PSVT, PAF on Eliquis, pulmonary hypertension, GERD, HLD, hiatal hernia.  TEST/EVENTS:  07/18/2022 echo: EF 70-75%.  Normal diastolic parameters.  RV function normal, size mildly enlarged.  Normal PASP.  LA severely dilated.  RA severely dilated.  Trivial MR. 10/25/2022 HST: AHI 11, SpO2 low 71% 10/25/2022 spirometry: FVC 68, FEV1 56, ratio of 63  07/27/2022: OV with Dr. Vassie Loll.  Has not benefit from bronchodilators in the past.  Felt deconditioning and obesity were contributing to her dyspnea.  Breathing doing well overall.  Had ablation for PAF.  Still on apixaban, amiodarone and Cardizem.  Last echo showed mildly enlarged RV, RVSP 32.  For the last 2 days has had increased cough with clear sputum.  Wondered if it was triggered by some fragments that she is in the house.  Interested in traveling soon.  Has prednisone handy in case she gets worse.  Would like a prescription for Z-Pak to have on hand as well.  Spirometry pre-/post ordered.  Home sleep study ordered due to snoring.  12/08/2022: Today-follow-up Patient presents today for follow-up to discuss recent home sleep study results.  She has mild obstructive sleep apnea with severe oxygen desaturations to 71%.  She was recommended to start auto CPAP 5 to 15 cm of water but she declined to do this.  She also today that she has no desire to be on CPAP therapy.  Her husband has 1 he has a lot of difficulties with it.  She is interested in potential alternative therapies.  Does not sleep well  at night, primarily due to back pain.  This causes her to toss and turn a lot.  Being connected to a device with cords makes it more difficult for her to sleep.  Denies any drowsy driving or morning headaches. Breathing overall is stable.  No significant cough, chest congestion or wheezing.  No recent exacerbations requiring steroids or antibiotics.  Does not use any inhalers.  Allergies  Allergen Reactions   Ace Inhibitors Diarrhea    Immunization History  Administered Date(s) Administered   Fluad Quad(high Dose 65+) 11/21/2018   Influenza Split 11/22/2018   Influenza, High Dose Seasonal PF 12/09/2016, 11/21/2018, 02/18/2020, 12/25/2020   Influenza,inj,Quad PF,6+ Mos 11/29/2013   PFIZER(Purple Top)SARS-COV-2 Vaccination 04/28/2019, 12/25/2019   Pneumococcal Conjugate-13 10/31/2014   Pneumococcal Polysaccharide-23 12/01/2015    Past Medical History:  Diagnosis Date   Asthma    COPD (chronic obstructive pulmonary disease) (HCC)    questionable   Degeneration of intervertebral disc, site unspecified    Depression    DJD (degenerative joint disease)    Dyslipidemia    Dysrhythmia    GERD (gastroesophageal reflux disease)    Pneumonia 04/14/2013   Shortness of breath    on exertion   Sleep apnea    stopbang=4   Unspecified essential hypertension     Tobacco History: Social History   Tobacco Use  Smoking Status Never   Passive exposure: Yes  Smokeless Tobacco Never  Tobacco  Comments   both parents smoked   Counseling given: Not Answered Tobacco comments: both parents smoked   Outpatient Medications Prior to Visit  Medication Sig Dispense Refill   magnesium gluconate (MAGONATE) 500 MG tablet Take 500 mg by mouth daily.     apixaban (ELIQUIS) 5 MG TABS tablet TAKE 1 TABLET TWICE DAILY 180 tablet 1   atorvastatin (LIPITOR) 10 MG tablet Take 10 mg by mouth daily.     chlorthalidone (HYGROTON) 25 MG tablet TAKE 1 TABLET EVERY DAY 90 tablet 3   Cholecalciferol (VITAMIN  D) 50 MCG (2000 UT) tablet Take 2,000 Units by mouth daily.     diltiazem (CARDIZEM) 60 MG tablet TAKE 1 TABLET(60 MG) BY MOUTH EVERY 8 HOURS AS NEEDED FOR PALPITATIONS 270 tablet 0   furosemide (LASIX) 20 MG tablet Take 1 tablet (20 mg total) by mouth daily. As needed pt may take additional 20 MG Lasix by mouth as needed if directed by Alleviate HF Study x 4 days. Do not remove prescription. 90 tablet 3   lisinopril (ZESTRIL) 40 MG tablet TAKE 1 TABLET EVERY DAY 90 tablet 3   meloxicam (MOBIC) 15 MG tablet Take 15 mg by mouth every other day.     Multiple Vitamin (MULTIVITAMIN WITH MINERALS) TABS tablet Take 1 tablet by mouth daily.     potassium chloride SA (KLOR-CON M) 20 MEQ tablet TAKE 2 TABLETS EVERY DAY 180 tablet 1   zinc gluconate 50 MG tablet Take 50 mg by mouth daily.     diltiazem (CARDIZEM CD) 180 MG 24 hr capsule Take 1 capsule (180 mg total) by mouth daily. 90 capsule 1   pantoprazole (PROTONIX) 40 MG tablet Take 1 tablet (40 mg total) by mouth daily. 30 tablet 0   No facility-administered medications prior to visit.     Review of Systems:   Constitutional: No weight loss or gain, night sweats, fevers, chills, or lassitude. +fatigue  HEENT: No headaches, difficulty swallowing, tooth/dental problems, or sore throat. No sneezing, itching, ear ache. +nasal congestion, post nasal drip CV:  No chest pain, orthopnea, PND, swelling in lower extremities, anasarca, dizziness, palpitations, syncope Resp: +snoring; baseline shortness of breath with exertion. No excess mucus or change in color of mucus. No productive or non-productive. No hemoptysis. No wheezing.  No chest wall deformity GI:  No heartburn, indigestion GU: No dysuria, change in color of urine, urgency or frequency.   Skin: No rash, lesions, ulcerations MSK:  No joint pain or swelling.   Neuro: No dizziness or lightheadedness.  Psych: No depression or anxiety. Mood stable. +sleep disturbance     Physical Exam:  BP  100/70 (BP Location: Right Arm, Cuff Size: Large)   Pulse (!) 108   Ht 5\' 6"  (1.676 m)   Wt 241 lb (109.3 kg)   SpO2 97%   BMI 38.90 kg/m   GEN: Pleasant, interactive, well-appearing; obese; in no acute distress. HEENT:  Normocephalic and atraumatic. PERRLA. Sclera white. Nasal turbinates pink, moist and patent bilaterally. No rhinorrhea present. Oropharynx pink and moist, without exudate or edema. No lesions, ulcerations, or postnasal drip. Mallampati III NECK:  Supple w/ fair ROM. No JVD present. Normal carotid impulses w/o bruits. Thyroid symmetrical with no goiter or nodules palpated. No lymphadenopathy.   CV: RRR, no m/r/g, no peripheral edema. Pulses intact, +2 bilaterally. No cyanosis, pallor or clubbing. PULMONARY:  Unlabored, regular breathing. Clear bilaterally A&P w/o wheezes/rales/rhonchi. No accessory muscle use.  GI: BS present and normoactive. Soft, non-tender to palpation.  No organomegaly or masses detected.  MSK: No erythema, warmth or tenderness. Cap refil <2 sec all extrem. No deformities or joint swelling noted.  Neuro: A/Ox3. No focal deficits noted.   Skin: Warm, no lesions or rashe Psych: Normal affect and behavior. Judgement and thought content appropriate.     Lab Results:  CBC    Component Value Date/Time   WBC 11.4 (H) 08/18/2022 1305   WBC 7.7 06/08/2022 1546   RBC 5.25 08/18/2022 1305   RBC 5.57 (H) 06/08/2022 1546   HGB 15.9 08/18/2022 1305   HCT 48.3 (H) 08/18/2022 1305   PLT 261 08/18/2022 1305   MCV 92 08/18/2022 1305   MCH 30.3 08/18/2022 1305   MCH 30.3 06/08/2022 1546   MCHC 32.9 08/18/2022 1305   MCHC 32.8 06/08/2022 1546   RDW 12.9 08/18/2022 1305   LYMPHSABS 1.8 06/08/2022 1546   MONOABS 0.6 06/08/2022 1546   EOSABS 0.2 06/08/2022 1546   BASOSABS 0.1 06/08/2022 1546    BMET    Component Value Date/Time   NA 139 08/18/2022 1305   K 4.4 08/18/2022 1305   CL 101 08/18/2022 1305   CO2 22 08/18/2022 1305   GLUCOSE 90 08/18/2022  1305   GLUCOSE 100 (H) 06/08/2022 1546   BUN 23 08/18/2022 1305   CREATININE 0.92 08/18/2022 1305   CALCIUM 9.6 08/18/2022 1305   GFRNONAA 59 (L) 06/08/2022 1546   GFRAA >60 10/25/2014 1920    BNP    Component Value Date/Time   BNP 92.1 08/18/2022 1305   BNP 158.8 (H) 07/13/2022 1604     Imaging:  DG Lumbar Spine Complete  Result Date: 11/28/2022 CLINICAL DATA:  Chronic low back pain. EXAM: LUMBAR SPINE - COMPLETE 4+ VIEW COMPARISON:  01/03/2018 CT abdomen and pelvis-04/22/2021 FINDINGS: There are 5 non-rib-bearing lumbar-type vertebral bodies. No significant scoliotic curvature. Accentuated lumbar lordosis. Minimal (approximately 3 mm) retrolisthesis of L1 upon L2. Minimal (4-5 mm) of anterolisthesis of L4 upon L5 and L5 upon S1. No definite pars defects. Lumbar vertebral body heights appear preserved. Mild-to-moderate multilevel lumbar spine DDD, worse at T12-L1 and L5-S1 with disc space height loss endplate irregularity and sclerosis. Limited visualization of the bilateral SI joints is normal. Mild degenerative changes bilateral hips with joint space loss, subchondral sclerosis and osteophytosis. Dystrophic calcification overlying the midline of the pelvis could represent a uterine fibroid. Phleboliths overlie the lower pelvis bilaterally. Atherosclerotic plaque within the abdominal aorta and pelvic vasculature. Nonobstructive bowel gas pattern. IMPRESSION: 1. Mild-to-moderate multilevel lumbar spine DDD, worse at T12-L1 and L5-S1. 2. Mild degenerative change of the bilateral hips. 3.  Aortic Atherosclerosis (ICD10-I70.0). Electronically Signed   By: Simonne Come M.D.   On: 11/28/2022 08:59   MM 3D SCREENING MAMMOGRAM BILATERAL BREAST  Result Date: 11/18/2022 CLINICAL DATA:  Screening. EXAM: DIGITAL SCREENING BILATERAL MAMMOGRAM WITH TOMOSYNTHESIS AND CAD TECHNIQUE: Bilateral screening digital craniocaudal and mediolateral oblique mammograms were obtained. Bilateral screening digital breast  tomosynthesis was performed. The images were evaluated with computer-aided detection. COMPARISON:  Previous exam(s). ACR Breast Density Category c: The breasts are heterogeneously dense, which may obscure small masses. FINDINGS: There are no findings suspicious for malignancy. IMPRESSION: No mammographic evidence of malignancy. A result letter of this screening mammogram will be mailed directly to the patient. RECOMMENDATION: Screening mammogram in one year. (Code:[id number]-B-01Y) BI-RADS CATEGORY  1: Negative. Electronically Signed   By: Baird Lyons M.D.   On: 11/18/2022 08:10   DG BONE DENSITY (DXA)  Result Date: 11/17/2022 EXAM: DUAL  X-RAY ABSORPTIOMETRY (DXA) FOR BONE MINERAL DENSITY IMPRESSION: Your patient Kathryn Olsen completed a BMD test on 11/17/2022 using the [employer] DXA System (software version: 14.10) manufactured by [employer]. The following summarizes the results of our evaluation. Technologist:AMR PATIENT BIOGRAPHICAL: Name: Kathryn Olsen, Kathryn Olsen Patient ID: [id number] Birth Date: [birth year] Height: 65.0 in. Gender: Female Exam Date: 11/17/2022 Weight: 243.6 lbs. Indications: Caucasian, Follow up Osteopenia, Height Loss, History of Fracture (Adult), Parent Hip Fracture, Partial Hysterectomy, Post Menopausal, Unilateral oophrectomy, Family Hist. (Parent hip fracture) Fractures: Wrist Treatments: Multivitamin, Vitamin D DENSITOMETRY RESULTS: Site         Region     Measured Date Measured Age WHO Classification Young Adult T-score BMD         %Change vs. Previous Significant Change (*) DualFemur Neck Right 11/17/2020 74.4 Osteopenia -2.0 0.760 g/cm2 -0.3% - DualFemur Neck Right 04/22/2020 71.9 Osteopenia -2.0 0.762 g/cm2 - - DualFemur Total Mean 11/17/2022 74.4 Osteopenia -1.2 0.851 g/cm2 2.8% Yes DualFemur Total Mean 04/22/2020 71.9 Osteopenia -1.4 0.828 g/cm2 - - Left Forearm Radius 33% 11/17/2022 74.4 Osteopenia -1.9 0.580 g/cm2 -1.5% - Left Forearm Radius 33% 04/22/2020 71.9  Osteopenia -1.8 0.589 g/cm2 - - ASSESSMENT: The BMD measured at Femur Neck Right is 0.760 g/cm2 with a T-score of -2.0. This patient's diagnostic category is LOW BONE MASS/OSTEOPENIA according to World Health Organization [facility]) criteria. The scan quality is good. Compared with the prior study on 04/22/20, the BMD of the total mean shows a statistically significant decrease. Lumbar spine was excluded due to advanced degenerative changes. World Science writer [facility]) criteria for post-menopausal, Caucasian Women: Normal:       T-score at or above -1 SD Osteopenia:   T-score between -1 and -2.5 SD Osteoporosis: T-score at or below -2.5 SD RECOMMENDATIONS: 1. All patients should optimize calcium and vitamin D intake. 2. Consider FDA-approved medical therapies in postmenopausal women and med aged 73 years and older, based on the following: a. A hip or vertebral (clinical or morphometric) fracture b. T-score< -2.5 at the femoral neck or spine after appropriate evaluation to exclude secondary causes c. Low bone mass (T-score between -1.0 and -2.5 at the femoral neck or spine) and a 10-year probability of a hip fracture > 3% or a 10-year probability of a major osteoporosis-related fracture > 20% based on the [place]-adapted WHO algorithm d. Clinician judgment and/or patient preferences may indicate treatment for people with 10-year fracture probabilities above or below these levels FOLLOW-UP: Patients with diagnosis of osteoporsis or at high risk for fracture should have regular bone mineral density tests. For patients eligible for Medicare, routine testing is allowed once every 2 years. The testing frequency can be increased to one year for patients who have rapidly progressing disease, those who are receiving or discontinuing medical therapy to restore bone mass, or have additional risk factors. I have reviewed this report, and agree with the above findings. [facility] Your patient Naara Hammen  completed a FRAX assessment on 11/17/2022 using the [employer] DXA System (analysis version: 14.10) manufactured by [employer]. The following summarizes the results of our evaluation. PATIENT BIOGRAPHICAL: Name: Kathryn Olsen, Kathryn Olsen Patient ID: [id number] Birth Date: March 19, 1950 Height:    65.0 in. Gender:     Female    [age]       Weight:    243.6 lbs. Ethnicity:  White  Exam Date: 11/17/2022 FRAX* RESULTS:  (version: 3.5) 10-year Probability of Fracture1 Major Osteoporotic Fracture2 Hip Fracture 28.4% 14.4% Population: [place] (Caucasian) Risk Factors: History of Fracture (Adult), Family Hist. (Parent hip fracture) Based on DualFemur (Right) Neck BMD 1 -The 10-year probability of fracture may be lower than reported if the patient has received treatment. 2 -Major Osteoporotic Fracture: Clinical Spine, Forearm, Hip or Shoulder *FRAX is a Armed forces logistics/support/administrative officer of the [employer] of [employer] for Metabolic Bone Disease, a World Science writer (WHO) [employer]. ASSESSMENT: The probability of a major osteoporotic fracture is 28.4% within the next ten years. The probability of a hip fracture is 14.4% within the next ten years. Electronically Signed   By: Harmon Pier M.D.   On: 11/17/2022 13:27    Administration History     None          Latest Ref Rng & Units 10/25/2022   11:37 AM 11/29/2013   10:04 AM  PFT Results  FVC-Pre L 2.06  2.20   FVC-Predicted Pre % 68  66   FVC-Post L  2.77   FVC-Predicted Post %  83   Pre FEV1/FVC % % 63  63   Post FEV1/FCV % %  73   FEV1-Pre L 1.29  1.39   FEV1-Predicted Pre % 56  55   FEV1-Post L  2.03   DLCO uncorrected ml/min/mmHg  25.89   DLCO UNC% %  99   DLVA Predicted %  113     No results found for: "NITRICOXIDE"      Assessment & Plan:   Mild obstructive sleep apnea Mild OSA with AHI 11/h and SpO2 low 71%. Reviewed risks of untreated OSA and potential treatment options. She declines  CPAP therapy. Would like to investigate cost of oral appliance. Referral placed today. Healthy weight loss encouraged. Aware of safe driving practices.  Patient Instructions  Your sleep study revealed mild sleep apnea. We discussed how untreated sleep apnea puts an individual at risk for cardiac arrhthymias, pulm HTN, DM, stroke and increases their risk for daytime accidents. We also briefly reviewed treatment options including weight loss, side sleeping position, oral appliance, CPAP therapy   We have placed a referral for oral appliance fitting  Use flonase nasal spray 2 sprays each nostril daily for allergies Daily over the counter allergy pill  Follow up in 6 months with Dr. Vassie Loll. If symptoms do not improve or worsen, please contact office for sooner follow up or seek emergency care.    Asthma, chronic Stable. No maintenance regimen. No recent exacerbations. Action plan in place.   Allergic rhinitis Increased allergy symptoms. Restart intranasal steroid and add daily antihistamine.    I spent 35 minutes of dedicated to the care of this patient on the date of this encounter to include pre-visit review of records, face-to-face time with the patient discussing conditions above, post visit ordering of testing, clinical documentation with the electronic health record, making appropriate referrals as documented, and communicating necessary findings to members of the patients care team.  Kathryn Chapel, NP 12/08/2022  Pt aware and understands NP's role.

## 2022-12-08 NOTE — Assessment & Plan Note (Signed)
Increased allergy symptoms. Restart intranasal steroid and add daily antihistamine.

## 2022-12-08 NOTE — Assessment & Plan Note (Signed)
Mild OSA with AHI 11/h and SpO2 low 71%. Reviewed risks of untreated OSA and potential treatment options. She declines CPAP therapy. Would like to investigate cost of oral appliance. Referral placed today. Healthy weight loss encouraged. Aware of safe driving practices.  Patient Instructions  Your sleep study revealed mild sleep apnea. We discussed how untreated sleep apnea puts an individual at risk for cardiac arrhthymias, pulm HTN, DM, stroke and increases their risk for daytime accidents. We also briefly reviewed treatment options including weight loss, side sleeping position, oral appliance, CPAP therapy   We have placed a referral for oral appliance fitting  Use flonase nasal spray 2 sprays each nostril daily for allergies Daily over the counter allergy pill  Follow up in 6 months with Dr. Vassie Loll. If symptoms do not improve or worsen, please contact office for sooner follow up or seek emergency care.

## 2022-12-08 NOTE — Assessment & Plan Note (Signed)
Stable. No maintenance regimen. No recent exacerbations. Action plan in place.

## 2022-12-08 NOTE — Patient Instructions (Addendum)
Your sleep study revealed mild sleep apnea. We discussed how untreated sleep apnea puts an individual at risk for cardiac arrhthymias, pulm HTN, DM, stroke and increases their risk for daytime accidents. We also briefly reviewed treatment options including weight loss, side sleeping position, oral appliance, CPAP therapy   We have placed a referral for oral appliance fitting  Use flonase nasal spray 2 sprays each nostril daily for allergies Daily over the counter allergy pill  Follow up in 6 months with Dr. Vassie Loll. If symptoms do not improve or worsen, please contact office for sooner follow up or seek emergency care.

## 2022-12-12 ENCOUNTER — Telehealth: Payer: Self-pay | Admitting: Cardiology

## 2022-12-12 DIAGNOSIS — I4891 Unspecified atrial fibrillation: Secondary | ICD-10-CM

## 2022-12-12 MED ORDER — POTASSIUM CHLORIDE CRYS ER 20 MEQ PO TBCR: 40.0000 meq | EXTENDED_RELEASE_TABLET | Freq: Every day | ORAL | 1 refills | Status: DC

## 2022-12-12 MED ORDER — DILTIAZEM HCL 60 MG PO TABS
ORAL_TABLET | ORAL | 1 refills | Status: DC
Start: 2022-12-12 — End: 2023-01-11

## 2022-12-12 MED ORDER — APIXABAN 5 MG PO TABS: 5.0000 mg | ORAL_TABLET | Freq: Two times a day (BID) | ORAL | 1 refills | Status: DC

## 2022-12-12 NOTE — Telephone Encounter (Signed)
*  STAT* If patient is at the pharmacy, call can be transferred to refill team.   1. Which medications need to be refilled? (please list name of each medication and dose if known)  apixaban (ELIQUIS) 5 MG TABS tablet  diltiazem (CARDIZEM) 180 MG tablet ER potassium chloride SA (KLOR-CON M) 20 MEQ tablet  meloxicam (MOBIC) 15 MG tablet    2. Would you like to learn more about the convenience, safety, & potential cost savings by using the [facility]?    3. Are you open to using the [facility] (Type [facility].  ).   4. Which pharmacy/location (including street and city if local pharmacy) is medication to be sent to? [id number] - [place], [place] - [address]    5. Do they need a 30 day or 90 day supply? 90 day

## 2022-12-12 NOTE — Telephone Encounter (Signed)
Prescription refill request for Eliquis received. Indication: AF Last office visit: 09/20/22  A Mealor MD Scr: 1.00 on 11/17/22  Epic Age: 72 Weight: 110.5kg  Based on above findings Eliquis 5mg  twice daily is the appropriate dose.  Refill approved.

## 2022-12-12 NOTE — Telephone Encounter (Signed)
Diltiazem and K completed. Will route to coumadin for Eliquis.

## 2022-12-19 DIAGNOSIS — Z006 Encounter for examination for normal comparison and control in clinical research program: Secondary | ICD-10-CM

## 2022-12-19 NOTE — Research (Signed)
Alleviate FR Research Study  4 Month follow up  No adverse events or cardiovascular medication changes to report at this time. Patient states she is still taking Cardizem 180 MG 24 hr capsule daily.    Current Outpatient Medications:    apixaban (ELIQUIS) 5 MG TABS tablet, Take 1 tablet (5 mg total) by mouth 2 (two) times daily., Disp: 180 tablet, Rfl: 1   atorvastatin (LIPITOR) 10 MG tablet, Take 10 mg by mouth daily., Disp: , Rfl:    chlorthalidone (HYGROTON) 25 MG tablet, TAKE 1 TABLET EVERY DAY, Disp: 90 tablet, Rfl: 3   Cholecalciferol (VITAMIN D) 50 MCG (2000 UT) tablet, Take 2,000 Units by mouth daily., Disp: , Rfl:    diltiazem (CARDIZEM) 60 MG tablet, TAKE 1 TABLET(60 MG) BY MOUTH EVERY 8 HOURS AS NEEDED FOR PALPITATIONS, Disp: 270 tablet, Rfl: 1   furosemide (LASIX) 20 MG tablet, Take 1 tablet (20 mg total) by mouth daily. As needed pt may take additional 20 MG Lasix by mouth as needed if directed by Alleviate HF Study x 4 days. Do not remove prescription., Disp: 90 tablet, Rfl: 3   lisinopril (ZESTRIL) 40 MG tablet, TAKE 1 TABLET EVERY DAY, Disp: 90 tablet, Rfl: 3   magnesium gluconate (MAGONATE) 500 MG tablet, Take 500 mg by mouth daily., Disp: , Rfl:    meloxicam (MOBIC) 15 MG tablet, Take 15 mg by mouth every other day., Disp: , Rfl:    Multiple Vitamin (MULTIVITAMIN WITH MINERALS) TABS tablet, Take 1 tablet by mouth daily., Disp: , Rfl:    potassium chloride SA (KLOR-CON M) 20 MEQ tablet, Take 2 tablets (40 mEq total) by mouth daily., Disp: 180 tablet, Rfl: 1   zinc gluconate 50 MG tablet, Take 50 mg by mouth daily., Disp: , Rfl:

## 2023-01-10 ENCOUNTER — Telehealth: Payer: Self-pay | Admitting: Cardiology

## 2023-01-10 DIAGNOSIS — I4891 Unspecified atrial fibrillation: Secondary | ICD-10-CM

## 2023-01-10 NOTE — Telephone Encounter (Signed)
*  STAT* If patient is at the pharmacy, call can be transferred to refill team.   1. Which medications need to be refilled? (please list name of each medication and dose if known) diltiazem (CARDIZEM) 60 MG tablet   potassium chloride SA (KLOR-CON M) 20 MEQ tablet   2. Which pharmacy/location (including street and city if local pharmacy) is medication to be sent to?  [id number] - [place], [place] - [address]    3. Do they need a 30 day or 90 day supply? 90

## 2023-01-11 MED ORDER — POTASSIUM CHLORIDE CRYS ER 20 MEQ PO TBCR: 40.0000 meq | EXTENDED_RELEASE_TABLET | Freq: Every day | ORAL | 1 refills | Status: DC

## 2023-01-11 MED ORDER — DILTIAZEM HCL 60 MG PO TABS
ORAL_TABLET | ORAL | 1 refills | Status: AC
Start: 2023-01-11 — End: ?

## 2023-01-11 NOTE — Telephone Encounter (Signed)
Refill request completed.

## 2023-03-29 ENCOUNTER — Ambulatory Visit: Payer: Medicare HMO | Admitting: Cardiology

## 2023-04-14 ENCOUNTER — Telehealth: Payer: Self-pay | Admitting: Cardiology

## 2023-04-14 MED ORDER — DILTIAZEM HCL ER COATED BEADS 180 MG PO CP24: 180.0000 mg | ORAL_CAPSULE | Freq: Every day | ORAL | 3 refills | Status: AC

## 2023-04-14 NOTE — Telephone Encounter (Signed)
When patient was seen on 12/08/22 by Pulmonary, the CMA removed her Diltiazem 180 mg daily order. I entered the order again and sent to [facility], patient aware.

## 2023-04-14 NOTE — Telephone Encounter (Signed)
Pt c/o medication issue:  1. Name of Medication: diltiazem (CARDIZEM) 180 MG tablet ER   2. How are you currently taking this medication (dosage and times per day)?   3. Are you having a reaction (difficulty breathing--STAT)?   4. What is your medication issue? Patient called stating she needs a script called in for the 180mg , not the 60mg  of this medication. She only takes the 60mg  PRN,and the 180mg  daily.  She would like it sent to  [id number] - [place], [place] - [address]  Medication was not listed on her current medication list.

## 2023-04-16 ENCOUNTER — Other Ambulatory Visit: Payer: Self-pay | Admitting: Cardiology

## 2023-04-23 ENCOUNTER — Emergency Department (HOSPITAL_COMMUNITY)
Admission: EM | Admit: 2023-04-23 | Discharge: 2023-04-23 | Disposition: A | Discharge status: Home / Self Care | Payer: Medicare HMO | Attending: Emergency Medicine | Admitting: Emergency Medicine

## 2023-04-23 ENCOUNTER — Encounter (HOSPITAL_COMMUNITY): Payer: Self-pay

## 2023-04-23 ENCOUNTER — Emergency Department (HOSPITAL_COMMUNITY): Payer: Medicare HMO

## 2023-04-23 ENCOUNTER — Other Ambulatory Visit: Payer: Self-pay

## 2023-04-23 DIAGNOSIS — Z7901 Long term (current) use of anticoagulants: Secondary | ICD-10-CM | POA: Insufficient documentation

## 2023-04-23 DIAGNOSIS — J449 Chronic obstructive pulmonary disease, unspecified: Secondary | ICD-10-CM | POA: Insufficient documentation

## 2023-04-23 DIAGNOSIS — J101 Influenza due to other identified influenza virus with other respiratory manifestations: Secondary | ICD-10-CM | POA: Diagnosis not present

## 2023-04-23 DIAGNOSIS — R0989 Other specified symptoms and signs involving the circulatory and respiratory systems: Secondary | ICD-10-CM | POA: Diagnosis not present

## 2023-04-23 DIAGNOSIS — R0602 Shortness of breath: Secondary | ICD-10-CM | POA: Diagnosis not present

## 2023-04-23 DIAGNOSIS — R059 Cough, unspecified: Secondary | ICD-10-CM | POA: Diagnosis not present

## 2023-04-23 DIAGNOSIS — Z20822 Contact with and (suspected) exposure to covid-19: Secondary | ICD-10-CM | POA: Insufficient documentation

## 2023-04-23 DIAGNOSIS — I4891 Unspecified atrial fibrillation: Secondary | ICD-10-CM | POA: Diagnosis not present

## 2023-04-23 DIAGNOSIS — I517 Cardiomegaly: Secondary | ICD-10-CM | POA: Diagnosis not present

## 2023-04-23 LAB — RESP PANEL BY RT-PCR (RSV, FLU A&B, COVID)  RVPGX2
Influenza A by PCR: POSITIVE — AB
Influenza B by PCR: NEGATIVE
Resp Syncytial Virus by PCR: NEGATIVE
SARS Coronavirus 2 by RT PCR: NEGATIVE

## 2023-04-23 MED ORDER — ALBUTEROL SULFATE (2.5 MG/3ML) 0.083% IN NEBU
2.5000 mg | INHALATION_SOLUTION | Freq: Once | RESPIRATORY_TRACT | Status: AC
  Administered 2023-04-23: 2.5 mg via RESPIRATORY_TRACT
  Filled 2023-04-23: qty 3

## 2023-04-23 MED ORDER — SODIUM CHLORIDE 0.9 % IV BOLUS
1000.0000 mL | Freq: Once | INTRAVENOUS | Status: AC
  Administered 2023-04-23: 1000 mL via INTRAVENOUS

## 2023-04-23 MED ORDER — ALBUTEROL SULFATE HFA 108 (90 BASE) MCG/ACT IN AERS: 1.0000 | INHALATION_SPRAY | Freq: Four times a day (QID) | RESPIRATORY_TRACT | 0 refills | Status: DC | PRN

## 2023-04-23 MED ORDER — SODIUM CHLORIDE 0.9 % IV BOLUS: 1000.0000 mL | Freq: Once | INTRAVENOUS | Status: DC

## 2023-04-23 MED ORDER — IPRATROPIUM-ALBUTEROL 0.5-2.5 (3) MG/3ML IN SOLN
3.0000 mL | Freq: Once | RESPIRATORY_TRACT | Status: AC
  Administered 2023-04-23: 3 mL via RESPIRATORY_TRACT
  Filled 2023-04-23: qty 3

## 2023-04-23 NOTE — ED Provider Notes (Signed)
 [place] EMERGENCY DEPARTMENT AT [facility] Provider Note   CSN: [id number] Arrival date & time: 04/23/23  9083     History  Chief Complaint  Patient presents with   Cough    Kathryn Olsen is a 73 y.o. female with a history including COPD, GERD, atrial fibrillation on Eliquis  has been exposed to her husband who was diagnosed with the flu 7 days ago.  Since then she has had escalating cough, wheezing, shortness of breath and generalized fatigue.  She home tested for the flu 4 days ago, states her test was negative.  She endorses increasing shortness of breath, states she gets pneumonia easily.  She has had no documented fevers but has had some chills.  She has taken Mucinex without improvement in her cough symptom.  She has had no nasal congestion, there has been some myalgias, no documented fevers.  She denies chest pain, abdominal pain.  She did have 1 episode of diarrhea after eating a burger yesterday, she has had no other episodes of diarrhea, has cut back on solid but has been drinking plenty of fluids.  The history is provided by the patient.       Home Medications Prior to Admission medications   Medication Sig Start Date End Date Taking? Authorizing Provider  albuterol  (VENTOLIN  HFA) 108 (90 Base) MCG/ACT inhaler Inhale 1-2 puffs into the lungs every 6 (six) hours as needed for wheezing or shortness of breath. 04/23/23  Yes Jersi Mcmaster, Mliss, PA-C  apixaban  (ELIQUIS ) 5 MG TABS tablet Take 1 tablet (5 mg total) by mouth 2 (two) times daily. 12/12/22   Alvan Dorn FALCON, MD  atorvastatin (LIPITOR) 10 MG tablet Take 10 mg by mouth daily. 11/28/17   [provider]  chlorthalidone  (HYGROTON ) 25 MG tablet TAKE 1 TABLET(25 MG) BY MOUTH DAILY 04/17/23   Alvan Dorn FALCON, MD  Cholecalciferol (VITAMIN D ) 50 MCG (2000 UT) tablet Take 2,000 Units by mouth daily.    [provider]  diltiazem  (CARDIZEM  CD) 180 MG 24 hr capsule Take 1 capsule (180 mg total) by  mouth daily. 04/14/23 07/13/23  Alvan Dorn FALCON, MD  diltiazem  (CARDIZEM ) 60 MG tablet TAKE 1 TABLET(60 MG) BY MOUTH EVERY 8 HOURS AS NEEDED FOR PALPITATIONS 01/11/23   Alvan Dorn FALCON, MD  furosemide  (LASIX ) 20 MG tablet Take 1 tablet (20 mg total) by mouth daily. As needed pt may take additional 20 MG Lasix  by mouth as needed if directed by Alleviate HF Study x 4 days. Do not remove prescription. 08/18/22   Camnitz, Will Gladis, MD  lisinopril  (ZESTRIL ) 40 MG tablet TAKE 1 TABLET(40 MG) BY MOUTH DAILY 04/17/23   Alvan Dorn FALCON, MD  magnesium  gluconate (MAGONATE) 500 MG tablet Take 500 mg by mouth daily.    [provider]  meloxicam (MOBIC) 15 MG tablet Take 15 mg by mouth every other day.    [provider]  Multiple Vitamin (MULTIVITAMIN WITH MINERALS) TABS tablet Take 1 tablet by mouth daily.    [provider]  potassium chloride  SA (KLOR-CON  M) 20 MEQ tablet Take 2 tablets (40 mEq total) by mouth daily. 01/11/23   Alvan Dorn FALCON, MD  zinc gluconate 50 MG tablet Take 50 mg by mouth daily.    [provider]      Allergies    Ace inhibitors    Review of Systems   Review of Systems  Constitutional:  Positive for chills and fatigue. Negative for fever.  HENT:  Negative for  congestion and sore throat.   Eyes: Negative.   Respiratory:  Positive for cough, shortness of breath and wheezing. Negative for chest tightness.   Cardiovascular:  Negative for chest pain.  Gastrointestinal:  Positive for diarrhea. Negative for abdominal pain, nausea and vomiting.  Genitourinary: Negative.   Musculoskeletal:  Positive for myalgias. Negative for arthralgias, joint swelling and neck pain.  Skin: Negative.  Negative for rash and wound.  Neurological:  Negative for dizziness, weakness, light-headedness, numbness and headaches.  Psychiatric/Behavioral: Negative.      Physical Exam Updated Vital Signs BP 94/64 (BP Location: Left Arm)   Pulse 67   Temp 99 F  (37.2 C) (Oral)   Resp 18   Ht 5' 6 (1.676 m)   Wt 97.5 kg   SpO2 98%   BMI 34.70 kg/m  Physical Exam Vitals and nursing note reviewed.  Constitutional:      Appearance: She is well-developed.  HENT:     Head: Normocephalic and atraumatic.  Eyes:     Conjunctiva/sclera: Conjunctivae normal.  Cardiovascular:     Rate and Rhythm: Normal rate. Rhythm irregular.     Heart sounds: Normal heart sounds.  Pulmonary:     Effort: Pulmonary effort is normal.     Breath sounds: Wheezing and rhonchi present.  Abdominal:     General: Bowel sounds are normal.     Palpations: Abdomen is soft.     Tenderness: There is no abdominal tenderness. There is no guarding.  Musculoskeletal:        General: Normal range of motion.     Cervical back: Normal range of motion.  Skin:    General: Skin is warm and dry.  Neurological:     Mental Status: She is alert.     ED Results / Procedures / Treatments   Labs (all labs ordered are listed, but only abnormal results are displayed) Labs Reviewed  RESP PANEL BY RT-PCR (RSV, FLU A&B, COVID)  [id number] - Abnormal; Notable for the following components:      Result Value   Influenza A by PCR POSITIVE (*)    All other components within normal limits    EKG EKG Interpretation Date/Time:  Sunday April 23 2023 12:03:54 EST Ventricular Rate:  99 PR Interval:    QRS Duration:  94 QT Interval:  360 QTC Calculation: 462 R Axis:   -55  Text Interpretation: Atrial fibrillation Incomplete right bundle branch block Left anterior fascicular block Nonspecific ST and T wave abnormality Abnormal ECG When compared with ECG of 20-Sep-2022 14:07, rate is faster Nonspecific T wave abnormality now evident in Lateral leads Confirmed by Towana Sharper [phone]) on 04/23/2023 12:06:47 PM  Radiology DG Chest Portable 1 View Result Date: 04/23/2023 CLINICAL DATA:  73 year old female with cough, congestion, shortness of breath. Husband is positive for influenza. EXAM:  PORTABLE CHEST 1 VIEW COMPARISON:  Cardiac CT 06/09/2022 and earlier. FINDINGS: AP chest at 1036 hours. Left chest cardiac loop recorder or lead less ICD. Mild cardiomegaly. Other mediastinal contours are within normal limits. Visualized tracheal air column is within normal limits. Allowing for portable technique the lungs are clear. No pneumothorax or pleural effusion. Negative visible bowel gas. No acute osseous abnormality identified. IMPRESSION: Mild cardiomegaly.  No acute cardiopulmonary abnormality. Electronically Signed   By: VEAR Hurst M.D.   On: 04/23/2023 11:26    Procedures Procedures    Medications Ordered in ED Medications  ipratropium-albuterol  (DUONEB) 0.5-2.5 (3) MG/3ML nebulizer solution 3 mL (3 mLs Nebulization Given 04/23/23  1116)  albuterol  (PROVENTIL ) (2.5 MG/3ML) 0.083% nebulizer solution 2.5 mg (2.5 mg Nebulization Given 04/23/23 1118)  sodium chloride  0.9 % bolus 1,000 mL (0 mLs Intravenous Stopped 04/23/23 1340)    ED Course/ Medical Decision Making/ A&P                                 Medical Decision Making Pt presenting with flu like sx,  initially with sob and wheezing, no cp,  after neb tx,  wheezing resolved and pt felt much improved with her breathing and has been without hypoxia here.  She has been able to tolerate fluids,  otw poor appetite,  review of vitals revealing consistently low bp's,  but in comparing prior VS,  relatively normal range for her, last ov bp 100/70. Orthostatics however did suggest some orthostasis upon standing, although pt ambulated in dept without weakness/dizziness or desaturation. She was given ns 1L prior to dc home.  Discussed home care for influenza, return precautions outlined  Amount and/or Complexity of Data Reviewed Labs: ordered.    Details: Resp panel Influenza A + Radiology: ordered.    Details: Cxr reviewed, no pneumonia, no pleural effusion/interstitial edema. ECG/medicine tests: ordered.    Details: Afib, rate  99  Risk Prescription drug management.           Final Clinical Impression(s) / ED Diagnoses Final diagnoses:  Influenza A    Rx / DC Orders ED Discharge Orders          Ordered    albuterol  (VENTOLIN  HFA) 108 (90 Base) MCG/ACT inhaler  Every 6 hours PRN        04/23/23 1151              Beronica Lansdale, PA-C 04/24/23 1309    Towana Ozell BROCKS, MD 04/24/23 [phone]

## 2023-04-23 NOTE — ED Notes (Signed)
 Ambulated with pt with pulse ox. O2 stayed 95-96% and pulse increased to 130 but stayed around 118-120

## 2023-04-23 NOTE — ED Notes (Signed)
 Pt stated she went to urgent care this am but there is a 2 hr wait so decided to come here.

## 2023-04-23 NOTE — Discharge Instructions (Addendum)
 Rest,  Drink plenty of fluids.  Take tylenol  for achiness and fever reduction and it is ok to continue using mucinex for your cough.  I have prescribed you an albuterol  inhaler to also help with your cough and wheezing.   Get rechecked for increased shortness of breath,  Increased fever or increasing weakness.

## 2023-04-23 NOTE — ED Triage Notes (Signed)
 Pt c/o cough and congestion for almost 1 week, progressively worse last 2 days. Completed multi test and all were negative as of Tuesday am., however husband is positive for Flu A.  Hx of HTN, takes bld thinner daily.

## 2023-04-28 ENCOUNTER — Ambulatory Visit: Payer: Medicare HMO | Attending: Cardiovascular Disease | Admitting: Cardiovascular Disease

## 2023-04-28 ENCOUNTER — Encounter: Payer: Self-pay | Admitting: Cardiovascular Disease

## 2023-04-28 VITALS — BP 118/70 | HR 73 | Ht 70.0 in | Wt 237.2 lb

## 2023-04-28 DIAGNOSIS — I4819 Other persistent atrial fibrillation: Secondary | ICD-10-CM

## 2023-04-28 DIAGNOSIS — D6869 Other thrombophilia: Secondary | ICD-10-CM | POA: Diagnosis not present

## 2023-04-28 NOTE — Progress Notes (Signed)
Electrophysiology Office Note:    Date:  04/28/2023   ID:  Kathryn Olsen, DOB [birth year], MRN [id number]  PCP:  Iona Hansen, NP   [facility] Providers Cardiologist:  Dina Rich, MD Electrophysiologist:  Maurice Small, MD     Referring MD: Iona Hansen, NP   History of Present Illness:    Kathryn Olsen is a 73 y.o. female with a hx listed below, significant for persistent atrial fibrillation, CHFpEF, pulmonary hypertension, COPD, hypertension referred for arrhythmia management.  She was diagnosed with atrial fibrillation in May 2023 after presenting to the ER with chest discomfort.  DC cardioversion was performed in August 2023.  In follow-up in March 2024, she was noted to have had recurrence of atrial fibrillation which, according to her cardia mobile device have been persistent since October 2023.  She was started on amiodarone and underwent DC cardioversion on March 6.  She She feels much better after the cardioversion.  Her shortness of breath and fatigue are significantly improved.    She underwent atrial fibrillation ablation in April 2024.  The ablation was uncomplicated.  She has noticed recurrence of fast and irregular heart rhythms intermittently over the past several months.  She was seen in the hospital for fluid this past weekend and noted to be in atrial fibrillation with rapid ventricular rates.     EKGs/Labs/Other Studies Reviewed Today:    Echocardiogram:  TTE - 12/04/2020 EF 60-65%. Mildly dilated LA   Monitors:   Stress testing:    Advanced imaging:   EKG:  Last EKG results: today - sinus rhythm, IRBBB, LAFB   Recent Labs: 05/16/2022: TSH 2.232 08/18/2022: ALT 21; BNP 92.1; BUN 23; Creatinine, Ser 0.92; Hemoglobin 15.9; Platelets 261; Potassium 4.4; Sodium 139     Physical Exam:    VS:  BP 118/70   Pulse 73   Ht 5\' 10"  (1.778 m)   Wt 237 lb 3.2 oz (107.6 kg)   SpO2 98%   BMI 34.03 kg/m     Wt  Readings from Last 3 Encounters:  04/28/23 237 lb 3.2 oz (107.6 kg)  04/23/23 215 lb (97.5 kg)  12/08/22 241 lb (109.3 kg)     GEN:  Well nourished, well developed in no acute distress CARDIAC: RRR, no murmurs, rubs, gallops RESPIRATORY:  Normal work of breathing MUSCULOSKELETAL: no edema    ASSESSMENT & PLAN:    Persistent atrial fibrillation Symptomatic.  Status post pulmonary vein isolation April 2024 With recurrence of atrial fibrillation We discussed arrhythmia management options, and using a shared decision making approach we decided to repeat mapping and ablation  We discussed the indication, rationale, logistics, anticipated benefits, and potential risks of the ablation procedure including but not limited to -- bleed at the groin access site, chest pain, damage to nearby organs such as the diaphragm, lungs, or esophagus, need for a drainage tube, or prolonged hospitalization. I explained that the risk for stroke, heart attack, need for open chest surgery, or even death is very low but not zero. she  expressed understanding and wishes to proceed.   Atrial flutter Loop recorder strips suggest atrial flutter We will plan to attempt arrhythmia induction Anticipate ablation of atrial flutter  Hypercoagulable state due to atrial fibrillation Continue apixaban 5 mg          Medication Adjustments/Labs and Tests Ordered: Current medicines are reviewed at length with the patient today.  Concerns regarding medicines are outlined above.  No orders of the defined types  were placed in this encounter.  No orders of the defined types were placed in this encounter.    Signed, Maurice Small, MD  04/28/2023 1:17 PM    [facility]

## 2023-04-28 NOTE — Patient Instructions (Signed)
Medication Instructions:   Continue all current medications.   Labwork:  none  Testing/Procedures:  Your physician has recommended that you have an afib/aflutter ablation. Catheter ablation is a medical procedure used to treat some cardiac arrhythmias (irregular heartbeats). During catheter ablation, a long, thin, flexible tube is put into a blood vessel in your groin (upper thigh), or neck. This tube is called an ablation catheter. It is then guided to your heart through the blood vessel. Radio frequency waves destroy small areas of heart tissue where abnormal heartbeats may cause an arrhythmia to start. Please see the instruction sheet given to you today.   Follow-Up:  Pending   Any Other Special Instructions Will Be Listed Below (If Applicable).   If you need a refill on your cardiac medications before your next appointment, please call your pharmacy.

## 2023-05-04 ENCOUNTER — Telehealth: Payer: Self-pay

## 2023-05-04 DIAGNOSIS — E66812 Obesity, class 2: Secondary | ICD-10-CM | POA: Diagnosis not present

## 2023-05-04 DIAGNOSIS — R739 Hyperglycemia, unspecified: Secondary | ICD-10-CM | POA: Diagnosis not present

## 2023-05-04 DIAGNOSIS — Z0000 Encounter for general adult medical examination without abnormal findings: Secondary | ICD-10-CM | POA: Diagnosis not present

## 2023-05-04 DIAGNOSIS — I10 Essential (primary) hypertension: Secondary | ICD-10-CM | POA: Diagnosis not present

## 2023-05-04 DIAGNOSIS — Z808 Family history of malignant neoplasm of other organs or systems: Secondary | ICD-10-CM | POA: Diagnosis not present

## 2023-05-04 DIAGNOSIS — I4819 Other persistent atrial fibrillation: Secondary | ICD-10-CM

## 2023-05-04 DIAGNOSIS — Z1211 Encounter for screening for malignant neoplasm of colon: Secondary | ICD-10-CM | POA: Diagnosis not present

## 2023-05-04 DIAGNOSIS — M85859 Other specified disorders of bone density and structure, unspecified thigh: Secondary | ICD-10-CM | POA: Diagnosis not present

## 2023-05-04 DIAGNOSIS — E785 Hyperlipidemia, unspecified: Secondary | ICD-10-CM | POA: Diagnosis not present

## 2023-05-04 NOTE — Telephone Encounter (Signed)
Pt is scheduled for Afib Ablation with Dr. Nelly Laurence on 3/19 at 8:30. CT is scheduled on 3/6 at 11:30. Pt had labs done today at her PCP. I will check in a few days to make sure we can see them in our system. If we need additional labs I will call her.   Instruction letters will be sent via MyChart once completed.

## 2023-05-10 DIAGNOSIS — Z006 Encounter for examination for normal comparison and control in clinical research program: Secondary | ICD-10-CM

## 2023-05-10 MED ORDER — FUROSEMIDE 20 MG PO TABS: ORAL_TABLET | ORAL | 3 refills | Status: DC

## 2023-05-10 NOTE — Research (Signed)
 Alleviate 6/8 Month F/U   No adverse events or cardiovascular changes to report at this time.  Labs drawn [id number]    Current Outpatient Medications:    albuterol (VENTOLIN HFA) 108 (90 Base) MCG/ACT inhaler, Inhale 1-2 puffs into the lungs every 6 (six) hours as needed for wheezing or shortness of breath., Disp: 6.7 g, Rfl: 0   apixaban (ELIQUIS) 5 MG TABS tablet, Take 1 tablet (5 mg total) by mouth 2 (two) times daily., Disp: 180 tablet, Rfl: 1   atorvastatin (LIPITOR) 10 MG tablet, Take 10 mg by mouth daily., Disp: , Rfl:    chlorthalidone (HYGROTON) 25 MG tablet, TAKE 1 TABLET(25 MG) BY MOUTH DAILY, Disp: 90 tablet, Rfl: 3   Cholecalciferol (VITAMIN D) 50 MCG (2000 UT) tablet, Take 2,000 Units by mouth daily., Disp: , Rfl:    diltiazem (CARDIZEM CD) 180 MG 24 hr capsule, Take 1 capsule (180 mg total) by mouth daily., Disp: 90 capsule, Rfl: 3   diltiazem (CARDIZEM) 60 MG tablet, TAKE 1 TABLET(60 MG) BY MOUTH EVERY 8 HOURS AS NEEDED FOR PALPITATIONS, Disp: 270 tablet, Rfl: 1   lisinopril (ZESTRIL) 40 MG tablet, TAKE 1 TABLET(40 MG) BY MOUTH DAILY, Disp: 90 tablet, Rfl: 3   magnesium gluconate (MAGONATE) 500 MG tablet, Take 500 mg by mouth daily., Disp: , Rfl:    meloxicam (MOBIC) 15 MG tablet, Take 15 mg by mouth every other day., Disp: , Rfl:    Multiple Vitamin (MULTIVITAMIN WITH MINERALS) TABS tablet, Take 1 tablet by mouth daily., Disp: , Rfl:    potassium chloride SA (KLOR-CON M) 20 MEQ tablet, Take 2 tablets (40 mEq total) by mouth daily., Disp: 180 tablet, Rfl: 1   zinc gluconate 50 MG tablet, Take 50 mg by mouth daily., Disp: , Rfl:    furosemide (LASIX) 20 MG tablet, Take 1 tablet (20 mg total) by mouth daily. As needed pt may take additional 20 MG Lasix by mouth as needed if directed by Alleviate HF Study x 4 days. Do not remove prescription., Disp: 90 tablet, Rfl: 3

## 2023-05-11 ENCOUNTER — Encounter: Payer: Self-pay | Admitting: Emergency Medicine

## 2023-05-11 ENCOUNTER — Telehealth (HOSPITAL_COMMUNITY): Payer: Self-pay

## 2023-05-11 NOTE — Telephone Encounter (Signed)
 Spoke with patient to complete one month pre-procedure call.     New medical conditions?  Diagnosed with Influenza on 04/23/23- reports fully recovered.  Recent hospitalizations or surgeries? No Started any new medications? Robaxin for general muscle pain Patient made aware to contact office to inform of any new medications started. Any changes in activities of daily living? No  Pre-procedure testing scheduled: CT on 05/18/23 and lab work completed 05/10/23  Confirmed patient is taking Eliquis and will continue taking medication before procedure or it may need to be rescheduled.  Confirmed patient is scheduled for Atrial Fibrillation Ablation on Tuesday, March 19 with Dr. York Pellant. Instructed patient to arrive at the Main Entrance A at [facility]: [address] [place], [place] [address] and check in at Admitting at 6:30 AM  Advised of plan to go home the same day and will only stay overnight if medically necessary. You MUST have a responsible adult to drive you home and MUST be with you the first 24 hours after you arrive home or your procedure could be cancelled.  Patient verbalized understanding to information provided and is agreeable to proceed with procedure.

## 2023-05-12 LAB — BASIC METABOLIC PANEL
BUN/Creatinine Ratio: 30 — ABNORMAL HIGH (ref 12–28)
BUN: 24 mg/dL (ref 8–27)
CO2: 22 mmol/L (ref 20–29)
Calcium: 9.4 mg/dL (ref 8.7–10.3)
Chloride: 104 mmol/L (ref 96–106)
Creatinine, Ser: 0.79 mg/dL (ref 0.57–1.00)
Glucose: 91 mg/dL (ref 70–99)
Potassium: 4.2 mmol/L (ref 3.5–5.2)
Sodium: 145 mmol/L — ABNORMAL HIGH (ref 134–144)
eGFR: 78 mL/min/{1.73_m2} (ref >59)

## 2023-05-12 LAB — CBC
Hematocrit: 49.4 % — ABNORMAL HIGH (ref 34.0–46.6)
Hemoglobin: 16.2 g/dL — ABNORMAL HIGH (ref 11.1–15.9)
MCH: 29.7 pg (ref 26.6–33.0)
MCHC: 32.8 g/dL (ref 31.5–35.7)
MCV: 91 fL (ref 79–97)
Platelets: 262 10*3/uL (ref 150–450)
RBC: 5.45 x10E6/uL — ABNORMAL HIGH (ref 3.77–5.28)
RDW: 12.3 % (ref 11.7–15.4)
WBC: 8.1 10*3/uL (ref 3.4–10.8)

## 2023-05-12 LAB — BRAIN NATRIURETIC PEPTIDE: BNP: 76.5 pg/mL (ref 0.0–100.0)

## 2023-05-18 ENCOUNTER — Ambulatory Visit (HOSPITAL_COMMUNITY)
Admission: RE | Admit: 2023-05-18 | Discharge: 2023-05-18 | Disposition: A | Discharge status: Home / Self Care | Payer: Medicare HMO | Source: Ambulatory Visit | Attending: Nurse Practitioner | Admitting: Nurse Practitioner

## 2023-05-18 DIAGNOSIS — I4819 Other persistent atrial fibrillation: Secondary | ICD-10-CM | POA: Insufficient documentation

## 2023-05-18 MED ORDER — IOHEXOL 350 MG/ML SOLN
95.0000 mL | Freq: Once | INTRAVENOUS | Status: AC | PRN
Start: 2023-05-18 — End: 2023-05-18
  Administered 2023-05-18: 95 mL via INTRAVENOUS

## 2023-05-20 DIAGNOSIS — Z1211 Encounter for screening for malignant neoplasm of colon: Secondary | ICD-10-CM | POA: Diagnosis not present

## 2023-05-24 ENCOUNTER — Encounter: Payer: Self-pay | Admitting: Cardiovascular Disease

## 2023-05-25 ENCOUNTER — Telehealth (HOSPITAL_COMMUNITY): Payer: Self-pay

## 2023-05-25 DIAGNOSIS — H903 Sensorineural hearing loss, bilateral: Secondary | ICD-10-CM | POA: Diagnosis not present

## 2023-05-25 NOTE — Telephone Encounter (Signed)
 Attempted to reach patient to follow up with procedure completed on 05/24/23, no answer. Left VM for patient to return call.

## 2023-05-25 NOTE — Telephone Encounter (Signed)
 Call placed to patient to discuss upcoming procedure.   CT: completed and acceptable.  Labs: completed and acceptable.   Any recent signs of acute illness or been started on antibiotics? No Any medications to hold? No Any missed doses of blood thinner? No Advised patient to continue taking ANTICOAGULANT: Eliquis (Apixaban) without missing any doses.  Medication instructions:  On the morning of your procedure DO NOT take any medication., including Eliquis or the procedure may be rescheduled. Nothing to eat or drink after midnight prior to your procedure.  Confirmed patient is scheduled for Atrial Fibrillation Ablation on Wednesday, March 19 with Dr. York Pellant. Instructed patient to arrive at the Main Entrance A at [facility]: [address] [place], [place] [address] and check in at Admitting at 6:30 AM  Advised of plan to go home the same day and will only stay overnight if medically necessary. You MUST have a responsible adult to drive you home and MUST be with you the first 24 hours after you arrive home or your procedure could be cancelled.  Patient verbalized understanding to all instructions provided and agreed to proceed with procedure.

## 2023-05-26 LAB — COLOGUARD: COLOGUARD: POSITIVE — AB

## 2023-05-29 DIAGNOSIS — M5459 Other low back pain: Secondary | ICD-10-CM | POA: Diagnosis not present

## 2023-05-29 DIAGNOSIS — M5451 Vertebrogenic low back pain: Secondary | ICD-10-CM | POA: Diagnosis not present

## 2023-05-29 DIAGNOSIS — M25561 Pain in right knee: Secondary | ICD-10-CM | POA: Diagnosis not present

## 2023-05-29 DIAGNOSIS — M542 Cervicalgia: Secondary | ICD-10-CM | POA: Diagnosis not present

## 2023-05-30 ENCOUNTER — Other Ambulatory Visit (HOSPITAL_COMMUNITY): Payer: Self-pay | Admitting: Family Medicine

## 2023-05-30 DIAGNOSIS — M542 Cervicalgia: Secondary | ICD-10-CM

## 2023-05-30 DIAGNOSIS — M5459 Other low back pain: Secondary | ICD-10-CM

## 2023-05-30 NOTE — Pre-Procedure Instructions (Signed)
 Instructed patient on the following items: Arrival time 0615 Nothing to eat or drink after midnight No meds AM of procedure Responsible person to drive you home and stay with you for 24 hrs  Have you missed any doses of anti-coagulant Eliquis- taken twice a day, hasn't missed any doses in 4 weeks.  Don't take dose morning of procedure.

## 2023-05-31 ENCOUNTER — Other Ambulatory Visit: Payer: Self-pay

## 2023-05-31 ENCOUNTER — Ambulatory Visit (HOSPITAL_COMMUNITY): Payer: Self-pay | Admitting: Anesthesiology

## 2023-05-31 ENCOUNTER — Ambulatory Visit (HOSPITAL_BASED_OUTPATIENT_CLINIC_OR_DEPARTMENT_OTHER): Payer: Self-pay | Admitting: Anesthesiology

## 2023-05-31 ENCOUNTER — Telehealth: Payer: Self-pay | Admitting: Cardiovascular Disease

## 2023-05-31 ENCOUNTER — Encounter (HOSPITAL_COMMUNITY)
Admission: RE | Disposition: A | Discharge status: Home / Self Care | Payer: Self-pay | Source: Home / Self Care | Attending: Cardiovascular Disease

## 2023-05-31 ENCOUNTER — Ambulatory Visit (HOSPITAL_COMMUNITY)
Admission: RE | Admit: 2023-05-31 | Discharge: 2023-05-31 | Disposition: A | Discharge status: Home / Self Care | Payer: Medicare HMO | Attending: Cardiovascular Disease | Admitting: Cardiovascular Disease

## 2023-05-31 DIAGNOSIS — J449 Chronic obstructive pulmonary disease, unspecified: Secondary | ICD-10-CM | POA: Diagnosis not present

## 2023-05-31 DIAGNOSIS — I110 Hypertensive heart disease with heart failure: Secondary | ICD-10-CM | POA: Diagnosis not present

## 2023-05-31 DIAGNOSIS — M1990 Unspecified osteoarthritis, unspecified site: Secondary | ICD-10-CM | POA: Insufficient documentation

## 2023-05-31 DIAGNOSIS — I4819 Other persistent atrial fibrillation: Secondary | ICD-10-CM | POA: Insufficient documentation

## 2023-05-31 DIAGNOSIS — G4730 Sleep apnea, unspecified: Secondary | ICD-10-CM | POA: Diagnosis not present

## 2023-05-31 DIAGNOSIS — I10 Essential (primary) hypertension: Secondary | ICD-10-CM

## 2023-05-31 DIAGNOSIS — F32A Depression, unspecified: Secondary | ICD-10-CM | POA: Diagnosis not present

## 2023-05-31 DIAGNOSIS — D6869 Other thrombophilia: Secondary | ICD-10-CM | POA: Insufficient documentation

## 2023-05-31 DIAGNOSIS — Z7901 Long term (current) use of anticoagulants: Secondary | ICD-10-CM | POA: Insufficient documentation

## 2023-05-31 DIAGNOSIS — K219 Gastro-esophageal reflux disease without esophagitis: Secondary | ICD-10-CM | POA: Diagnosis not present

## 2023-05-31 DIAGNOSIS — I5032 Chronic diastolic (congestive) heart failure: Secondary | ICD-10-CM | POA: Insufficient documentation

## 2023-05-31 DIAGNOSIS — I483 Typical atrial flutter: Secondary | ICD-10-CM | POA: Insufficient documentation

## 2023-05-31 LAB — POCT ACTIVATED CLOTTING TIME: Activated Clotting Time: 354 s

## 2023-05-31 SURGERY — ATRIAL FIBRILLATION ABLATION
Anesthesia: General

## 2023-05-31 MED ORDER — SODIUM CHLORIDE 0.9 % IV SOLN: 250.0000 mL | INTRAVENOUS | Status: DC | PRN

## 2023-05-31 MED ORDER — FENTANYL CITRATE (PF) 250 MCG/5ML IJ SOLN
INTRAMUSCULAR | Status: DC | PRN
Start: 2023-05-31 — End: 2023-05-31
  Administered 2023-05-31: 100 ug via INTRAVENOUS

## 2023-05-31 MED ORDER — NITROGLYCERIN 1 MG/10 ML FOR IR/CATH LAB
INTRA_ARTERIAL | Status: AC
  Filled 2023-05-31: qty 20

## 2023-05-31 MED ORDER — LIDOCAINE 2% (20 MG/ML) 5 ML SYRINGE
INTRAMUSCULAR | Status: DC | PRN
  Administered 2023-05-31: 20 mg via INTRAVENOUS

## 2023-05-31 MED ORDER — FENTANYL CITRATE (PF) 100 MCG/2ML IJ SOLN
INTRAMUSCULAR | Status: AC
Start: 2023-05-31 — End: ?
  Filled 2023-05-31: qty 2

## 2023-05-31 MED ORDER — NITROGLYCERIN 1 MG/10 ML FOR IR/CATH LAB
INTRA_ARTERIAL | Status: DC | PRN
  Administered 2023-05-31: 20 mL

## 2023-05-31 MED ORDER — ONDANSETRON HCL 4 MG/2ML IJ SOLN
INTRAMUSCULAR | Status: DC | PRN
  Administered 2023-05-31: 4 mg via INTRAVENOUS

## 2023-05-31 MED ORDER — ATROPINE SULFATE 1 MG/ML IV SOLN
INTRAVENOUS | Status: DC | PRN
  Administered 2023-05-31: 1 mg via INTRAVENOUS

## 2023-05-31 MED ORDER — ATROPINE SULFATE 1 MG/10ML IJ SOSY
PREFILLED_SYRINGE | INTRAMUSCULAR | Status: AC
  Filled 2023-05-31: qty 30

## 2023-05-31 MED ORDER — PHENYLEPHRINE 80 MCG/ML (10ML) SYRINGE FOR IV PUSH (FOR BLOOD PRESSURE SUPPORT)
PREFILLED_SYRINGE | INTRAVENOUS | Status: DC | PRN
  Administered 2023-05-31: 80 ug via INTRAVENOUS
  Administered 2023-05-31: 200 ug via INTRAVENOUS

## 2023-05-31 MED ORDER — LACTATED RINGERS IV SOLN: INTRAVENOUS | Status: DC | PRN

## 2023-05-31 MED ORDER — PROPOFOL 10 MG/ML IV BOLUS
INTRAVENOUS | Status: DC | PRN
Start: 2023-05-31 — End: 2023-05-31
  Administered 2023-05-31: 150 mg via INTRAVENOUS

## 2023-05-31 MED ORDER — HEPARIN (PORCINE) IN NACL 1000-0.9 UT/500ML-% IV SOLN
INTRAVENOUS | Status: DC | PRN
  Administered 2023-05-31 (×3): 500 mL

## 2023-05-31 MED ORDER — SODIUM CHLORIDE 0.9 % IV SOLN: INTRAVENOUS | Status: DC

## 2023-05-31 MED ORDER — PROTAMINE SULFATE 10 MG/ML IV SOLN
INTRAVENOUS | Status: DC | PRN
  Administered 2023-05-31: 50 mg via INTRAVENOUS

## 2023-05-31 MED ORDER — ROCURONIUM BROMIDE 10 MG/ML (PF) SYRINGE
PREFILLED_SYRINGE | INTRAVENOUS | Status: DC | PRN
  Administered 2023-05-31: 50 mg via INTRAVENOUS

## 2023-05-31 MED ORDER — DEXAMETHASONE SODIUM PHOSPHATE 10 MG/ML IJ SOLN
INTRAMUSCULAR | Status: DC | PRN
  Administered 2023-05-31: 10 mg via INTRAVENOUS

## 2023-05-31 MED ORDER — SODIUM CHLORIDE 0.9% FLUSH
3.0000 mL | Freq: Two times a day (BID) | INTRAVENOUS | Status: DC
Start: 2023-05-31 — End: 2023-05-31

## 2023-05-31 MED ORDER — PHENYLEPHRINE HCL-NACL 20-0.9 MG/250ML-% IV SOLN
INTRAVENOUS | Status: DC | PRN
  Administered 2023-05-31: 30 ug/min via INTRAVENOUS

## 2023-05-31 MED ORDER — HEPARIN SODIUM (PORCINE) 1000 UNIT/ML IJ SOLN
INTRAMUSCULAR | Status: DC | PRN
  Administered 2023-05-31: 18000 [IU] via INTRAVENOUS

## 2023-05-31 MED ORDER — SUGAMMADEX SODIUM 200 MG/2ML IV SOLN
INTRAVENOUS | Status: DC | PRN
  Administered 2023-05-31: 200 mg via INTRAVENOUS

## 2023-05-31 MED ORDER — ONDANSETRON HCL 4 MG/2ML IJ SOLN: 4.0000 mg | Freq: Four times a day (QID) | INTRAMUSCULAR | Status: DC | PRN

## 2023-05-31 MED ORDER — SODIUM CHLORIDE 0.9% FLUSH: 3.0000 mL | INTRAVENOUS | Status: DC | PRN

## 2023-05-31 MED ORDER — ACETAMINOPHEN 325 MG PO TABS: 650.0000 mg | ORAL_TABLET | ORAL | Status: DC | PRN

## 2023-05-31 SURGICAL SUPPLY — 22 items
BAG SNAP BAND KOVER 36X36 (MISCELLANEOUS) IMPLANT
BLANKET WARM UNDERBOD FULL ACC (MISCELLANEOUS) ×1 IMPLANT
CABLE PFA RX CATH CONN (CABLE) IMPLANT
CATH FARAWAVE ABLATION 31 (CATHETERS) IMPLANT
CATH OCTARAY 2.0 F 3-3-3-3-3 (CATHETERS) IMPLANT
CATH SOUNDSTAR ECO 8FR (CATHETERS) IMPLANT
CATH WEB BI DIR CSDF CRV REPRO (CATHETERS) IMPLANT
CLOSURE PERCLOSE PROSTYLE (VASCULAR PRODUCTS) IMPLANT
COVER SWIFTLINK CONNECTOR (BAG) ×1 IMPLANT
DEVICE CLOSURE MYNXGRIP 6/7F (Vascular Products) IMPLANT
DILATOR VESSEL 38 20CM 16FR (INTRODUCER) IMPLANT
GUIDEWIRE INQWIRE 1.5J.035X260 (WIRE) IMPLANT
INQWIRE 1.5J .035X260CM (WIRE) ×1 IMPLANT
KIT VERSACROSS CNCT FARADRIVE (KITS) IMPLANT
MAT PREVALON FULL STRYKER (MISCELLANEOUS) IMPLANT
PACK EP LF (CUSTOM PROCEDURE TRAY) ×1 IMPLANT
PAD DEFIB RADIO PHYSIO CONN (PAD) ×1 IMPLANT
PATCH CARTO3 (PAD) IMPLANT
SHEATH FARADRIVE STEERABLE (SHEATH) IMPLANT
SHEATH PINNACLE 8F 10CM (SHEATH) IMPLANT
SHEATH PINNACLE 9F 10CM (SHEATH) IMPLANT
SHEATH PROBE COVER 6X72 (BAG) IMPLANT

## 2023-05-31 NOTE — Progress Notes (Signed)
 Patient states she has not missed any doses of eliquis in the last 4 weeks.

## 2023-05-31 NOTE — Telephone Encounter (Signed)
 Pt c/o medication issue:  1. Name of Medication:   apixaban (ELIQUIS) 5 MG TABS tablet   2. How are you currently taking this medication (dosage and times per day)?   3. Are you having a reaction (difficulty breathing--STAT)?   4. What is your medication issue?   Patient stated she had a procedure done today and wants to know if she will need to take her night medications.

## 2023-05-31 NOTE — Transfer of Care (Signed)
 Immediate Anesthesia Transfer of Care Note  Patient: Kathryn Olsen  Procedure(s) Performed: ATRIAL FIBRILLATION ABLATION  Patient Location: Cath Lab  Anesthesia Type:General  Level of Consciousness: awake, alert , and oriented  Airway & Oxygen Therapy: Patient Spontanous Breathing and Patient connected to nasal cannula oxygen  Post-op Assessment: Report given to RN and Post -op Vital signs reviewed and stable  Post vital signs: Reviewed and stable  Last Vitals:  Vitals Value Taken Time  BP 118/76 05/31/23 1015  Temp    Pulse 78 05/31/23 1018  Resp 17 05/31/23 1018  SpO2 90 % 05/31/23 1018  Vitals shown include unfiled device data.  Last Pain:  Vitals:   05/31/23 0737  TempSrc:   PainSc: 0-No pain      Patients Stated Pain Goal: 5 (05/31/23 0737)  Complications: No notable events documented.

## 2023-05-31 NOTE — Telephone Encounter (Signed)
 Spoke with patient and she stated she didn't receive instructions regarding her Eliquis tonight   Discussed with Kathryn Fried NP and yes take Eliquis tonight  Advised patient, verbalized understanding

## 2023-05-31 NOTE — Anesthesia Preprocedure Evaluation (Addendum)
 Anesthesia Evaluation  Patient identified by MRN, date of birth, ID band Patient awake    Reviewed: Allergy & Precautions, NPO status , Patient's Chart, lab work & pertinent test results  History of Anesthesia Complications Negative for: history of anesthetic complications  Airway Mallampati: II  TM Distance: >3 FB Neck ROM: Full    Dental  (+) Dental Advisory Given, Chipped   Pulmonary asthma , sleep apnea , COPD, Recent URI , Resolved   Pulmonary exam normal        Cardiovascular hypertension, Pt. on medications Normal cardiovascular exam+ dysrhythmias Atrial Fibrillation and Supra Ventricular Tachycardia    '24 TTE - EF 70 to 75%. There is mild left ventricular hypertrophy. The right ventricular  size is mildly enlarged. Left atrial size was severely dilated. Right atrial size was severely dilated. Trivial mitral valve regurgitation.     Neuro/Psych  PSYCHIATRIC DISORDERS  Depression    negative neurological ROS     GI/Hepatic Neg liver ROS,GERD  Controlled,,  Endo/Other   Obesity   Renal/GU negative Renal ROS     Musculoskeletal  (+) Arthritis ,    Abdominal   Peds  Hematology  On eliquis    Anesthesia Other Findings   Reproductive/Obstetrics                             Anesthesia Physical Anesthesia Plan  ASA: 3  Anesthesia Plan: General   Post-op Pain Management: Minimal or no pain anticipated   Induction: Intravenous  PONV Risk Score and Plan: 3 and Treatment may vary due to age or medical condition, Ondansetron and Dexamethasone  Airway Management Planned: Oral ETT  Additional Equipment: None  Intra-op Plan:   Post-operative Plan: Extubation in OR  Informed Consent: I have reviewed the patients History and Physical, chart, labs and discussed the procedure including the risks, benefits and alternatives for the proposed anesthesia with the patient or authorized  representative who has indicated his/her understanding and acceptance.     Dental advisory given  Plan Discussed with: CRNA and Anesthesiologist  Anesthesia Plan Comments:         Anesthesia Quick Evaluation

## 2023-05-31 NOTE — Anesthesia Procedure Notes (Signed)
 Procedure Name: Intubation Date/Time: 05/31/2023 8:39 AM  Performed by: Allyn Kenner, CRNAPre-anesthesia Checklist: Patient identified, Emergency Drugs available, Suction available and Patient being monitored Patient Re-evaluated:Patient Re-evaluated prior to induction Oxygen Delivery Method: Circle System Utilized Preoxygenation: Pre-oxygenation with 100% oxygen Induction Type: IV induction Ventilation: Mask ventilation without difficulty Laryngoscope Size: Mac and 3 Grade View: Grade I Tube type: Oral Tube size: 7.0 mm Number of attempts: 1 Airway Equipment and Method: Stylet and Oral airway Placement Confirmation: ETT inserted through vocal cords under direct vision, positive ETCO2 and breath sounds checked- equal and bilateral Secured at: 21 cm Tube secured with: Tape Dental Injury: Teeth and Oropharynx as per pre-operative assessment

## 2023-05-31 NOTE — Discharge Instructions (Signed)

## 2023-05-31 NOTE — H&P (Signed)
 Electrophysiology Office Note:    Date:  05/31/2023   ID:  Kathryn Olsen, DOB 1950-09-04, MRN [id number]  PCP:  Iona Hansen, NP   [facility] Providers Cardiologist:  Dina Rich, MD Electrophysiologist:  Maurice Small, MD     Referring MD: Maurice Small, MD   History of Present Illness:    Kathryn Olsen is a 73 y.o. female with a hx listed below, significant for persistent atrial fibrillation, CHFpEF, pulmonary hypertension, COPD, hypertension referred for arrhythmia management.  She was diagnosed with atrial fibrillation in May 2023 after presenting to the ER with chest discomfort.  DC cardioversion was performed in August 2023.  In follow-up in March 2024, she was noted to have had recurrence of atrial fibrillation which, according to her cardia mobile device have been persistent since October 2023.  She was started on amiodarone and underwent DC cardioversion on March 6.  She She feels much better after the cardioversion.  Her shortness of breath and fatigue are significantly improved.    She underwent atrial fibrillation ablation in April 2024.  The ablation was uncomplicated.  She has noticed recurrence of fast and irregular heart rhythms intermittently over the past several months.  She was seen in the hospital for fluid this past weekend and noted to be in atrial fibrillation with rapid ventricular rates.  I reviewed the patient's CT and labs. There was no LAA thrombus. she  has not missed any doses of anticoagulation, and she took her dose last night. There have been no changes in the patient's diagnoses, medications, or condition since our recent clinic visit. She did have a routine screening cologard that was positive. She does not have a history of colon cancer.    EKGs/Labs/Other Studies Reviewed Today:    Echocardiogram:  TTE - 12/04/2020 EF 60-65%. Mildly dilated LA   Monitors:   Stress testing:    Advanced  imaging:   EKG:  Last EKG results: today - sinus rhythm, IRBBB, LAFB   Recent Labs: 08/18/2022: ALT 21 05/10/2023: BNP 76.5; BUN 24; Creatinine, Ser 0.79; Hemoglobin 16.2; Platelets 262; Potassium 4.2; Sodium 145     Physical Exam:    VS:  BP 116/83   Pulse 84   Temp 99 F (37.2 C) (Oral)   Resp 18   Ht 5\' 5"  (1.651 m)   Wt 99.8 kg   SpO2 96%   BMI 36.61 kg/m     Wt Readings from Last 3 Encounters:  05/31/23 99.8 kg  04/28/23 107.6 kg  04/23/23 97.5 kg     GEN:  Well nourished, well developed in no acute distress CARDIAC: RRR, no murmurs, rubs, gallops RESPIRATORY:  Normal work of breathing MUSCULOSKELETAL: no edema    ASSESSMENT & PLAN:    Persistent atrial fibrillation Symptomatic.  Status post pulmonary vein isolation April 2024 With recurrence of atrial fibrillation We discussed arrhythmia management options, and using a shared decision making approach we decided to repeat mapping and ablation  We discussed the indication, rationale, logistics, anticipated benefits, and potential risks of the ablation procedure including but not limited to -- bleed at the groin access site, chest pain, damage to nearby organs such as the diaphragm, lungs, or esophagus, need for a drainage tube, or prolonged hospitalization. I explained that the risk for stroke, heart attack, need for open chest surgery, or even death is very low but not zero. she  expressed understanding and wishes to proceed.   Atrial flutter Loop recorder strips suggest atrial  flutter We will plan to attempt arrhythmia induction Anticipate ablation of atrial flutter  Hypercoagulable state due to atrial fibrillation Continue apixaban 5 mg          Medication Adjustments/Labs and Tests Ordered: Current medicines are reviewed at length with the patient today.  Concerns regarding medicines are outlined above.  Orders Placed This Encounter  Procedures   EP STUDY   No orders of the defined types were  placed in this encounter.    Signed, Maurice Small, MD  05/31/2023 7:22 AM    [facility]

## 2023-06-01 ENCOUNTER — Encounter (HOSPITAL_COMMUNITY): Payer: Self-pay | Admitting: Cardiovascular Disease

## 2023-06-01 ENCOUNTER — Telehealth (HOSPITAL_COMMUNITY): Payer: Self-pay

## 2023-06-01 MED FILL — Fentanyl Citrate Preservative Free (PF) Inj 100 MCG/2ML: INTRAMUSCULAR | Qty: 2 | Status: AC

## 2023-06-01 NOTE — Telephone Encounter (Signed)
 Spoke with patient to complete post procedure follow up call.  Patient reports no complications with groin sites.   Instructions reviewed with patient:  Remove large bandage at puncture site after 24 hours. It is normal to have bruising, tenderness and a pea or marble sized lump/knot at the groin site which can take up to three months to resolve.  Get help right away if you notice sudden swelling at the puncture site.  Check your puncture site every day for signs of infection: fever, redness, swelling, pus drainage, warmth, foul odor or excessive pain. If this occurs, please call the office at [phone], to speak with the nurse. Get help right away if your puncture site is bleeding and the bleeding does not stop after applying firm pressure to the area.  You may continue to have skipped beats/ atrial fibrillation during the first several months after your procedure.  It is very important not to miss any doses of your blood thinner Eliquis. Patient restarted taking this medication on yesterday, 05/31/23.   You will follow up with the Afib clinic on 07/05/23 and follow up with the APP on 08/18/23.   Patient verbalized understanding to all instructions provided.

## 2023-06-01 NOTE — Anesthesia Postprocedure Evaluation (Signed)
 Anesthesia Post Note  Patient: Kathryn Olsen  Procedure(s) Performed: ATRIAL FIBRILLATION ABLATION     Patient location during evaluation: PACU Anesthesia Type: General Level of consciousness: awake and alert Pain management: pain level controlled Vital Signs Assessment: post-procedure vital signs reviewed and stable Respiratory status: spontaneous breathing, nonlabored ventilation, respiratory function stable and patient connected to nasal cannula oxygen Cardiovascular status: blood pressure returned to baseline and stable Postop Assessment: no apparent nausea or vomiting Anesthetic complications: no   There were no known notable events for this encounter.  Last Vitals:  Vitals:   05/31/23 1300 05/31/23 1330  BP: 118/77 123/86  Pulse: 73 79  Resp: 19 17  Temp:    SpO2: 94% 95%    Last Pain:  Vitals:   05/31/23 1021  TempSrc:   PainSc: 0-No pain                 Kennieth Rad

## 2023-06-04 NOTE — Progress Notes (Unsigned)
 Cardiology Office Note    Date:  06/07/2023  ID:  Kathryn Olsen, DOB [birth year], MRN [id number] Cardiologist: Dina Rich, MD   EP: Dr. Nelly Laurence  History of Present Illness:    Kathryn Olsen is a 73 y.o. female with past medical history of chronic HFpEF, SVT, paroxysmal atrial fibrillation (prior DCCV in 10/2021, atrial fibrillation ablation in 06/2022), pulmonary hypertension, coronary calcium by CT (coronary calcium score of 502 by CT in 05/2022), HTN, HLD and COPD who presents to the office today for 4-month follow-up.  She was examined by Dr. Wyline Mood in 07/2022 and had been experiencing issues with lower extremity edema and was taking extra Lasix as needed. She appeared euvolemic by examination and Lasix was lowered back to her prior dose of 20 mg daily. In the interim, she was noted to have recurrent atrial fibrillation/flutter and options for treatment were reviewed at the time of her office visit with Dr. Nelly Laurence in 04/2023. Repeat ablation was recommended and she underwent successful ablation on 05/31/2023.  In talking with the patient today, she reports overall doing well since her recent ablation. No complications regarding her groin sites. Reports having occasional, brief palpitations but only lasting for a few seconds and she has not needed to utilize short-acting Cardizem. She has baseline fatigue but no acute changes in this. No recent exertional chest pain or progressive dyspnea on exertion. No recent orthopnea, PND or pitting edema. She is planning to travel to [place] next week to visit family.  Studies Reviewed:   EKG: EKG is ordered today and demonstrates:   EKG Interpretation Date/Time:  Wednesday June 07 2023 15:08:16 EDT Ventricular Rate:  71 PR Interval:  142 QRS Duration:  90 QT Interval:  398 QTC Calculation: 432 R Axis:   -32  Text Interpretation: Normal sinus rhythm with sinus arrhythmia Left axis deviation Confirmed by Randall An ([id number])  on 06/07/2023 3:10:06 PM       Echocardiogram: 07/2022 IMPRESSIONS     1. Left ventricular ejection fraction, by estimation, is 70 to 75%. The  left ventricle has hyperdynamic function. The left ventricle has no  regional wall motion abnormalities. There is mild left ventricular  hypertrophy. Left ventricular diastolic  parameters were normal.   2. Right ventricular systolic function is normal. The right ventricular  size is mildly enlarged. There is normal pulmonary artery systolic  pressure.   3. Left atrial size was severely dilated.   4. Right atrial size was severely dilated.   5. The mitral valve is normal in structure. Trivial mitral valve  regurgitation.   6. The aortic valve is tricuspid. Aortic valve regurgitation is not  visualized. Aortic valve sclerosis is present, with no evidence of aortic  valve stenosis.   7. The inferior vena cava is normal in size with greater than 50%  respiratory variability, suggesting right atrial pressure of 3 mmHg.   Comparison(s): The left ventricular function is unchanged.     Risk Assessment/Calculations:   CHA2DS2-VASc Score = 5   This indicates a 7.2% annual risk of stroke. The patient's score is based upon: CHF History: 1 HTN History: 1 Diabetes History: 0 Stroke History: 0 Vascular Disease History: 1 Age Score: 1 Gender Score: 1    Physical Exam:   VS:  BP 110/76 (BP Location: Left Arm, Patient Position: Sitting, Cuff Size: Large)   Pulse 71   Ht 5\' 6"  (1.676 m)   Wt 238 lb (108 kg)   SpO2 98%   BMI 38.41 kg/m  Wt Readings from Last 3 Encounters:  06/07/23 238 lb (108 kg)  05/31/23 220 lb (99.8 kg)  04/28/23 237 lb 3.2 oz (107.6 kg)     GEN: Well nourished, well developed female appearing in no acute distress NECK: No JVD; No carotid bruits CARDIAC: RRR, no murmurs, rubs, gallops RESPIRATORY:  Clear to auscultation without rales, wheezing or rhonchi  ABDOMEN: Appears non-distended. No obvious abdominal  masses. EXTREMITIES: No clubbing or cyanosis. No pitting edema.  Distal pedal pulses are 2+ bilaterally.   Assessment and Plan:   1. Chronic HFpEF - Echocardiogram in 07/2022 showed a hyperdynamic LVEF of 70 to 75%. She denies any orthopnea or PND and appears euvolemic by examination today. Remains on Lasix 20 mg daily. Was encouraged to use compression stockings during her upcoming travel.  2. Persistent Atrial Fibrillation/Atrial Flutter - She underwent atrial fibrillation ablation in 04/2022 and had a repeat ablation last week. She is in normal sinus rhythm by examination and EKG today. Continue Cardizem CD 180 mg daily along with short-acting Cardizem as needed. - No reports of active bleeding. Remains on Eliquis 5 mg twice daily for anticoagulation which is the appropriate dose at this time given her age, weight and renal function.  3. Elevated Coronary Calcium Score/HLD - Her coronary calcium score was at 502 by CT in 05/2022 and had improved to 294 by repeat Cardiac CT in 05/2023. LDL was at 103 when checked in 04/2023 and she had recently been started on Atorvastatin 10 mg daily. If LDL remains above 70 by repeat labs later this year, would recommend titration of Atorvastatin to 20 mg daily.  4. HTN - Blood pressure is well-controlled at 110/76 during today's visit. Continue current medical therapy with Chlorthalidone 25 mg daily, Cardizem CD 180 mg daily and Lisinopril 40 mg daily.  5. OSA - Prior sleep study in 2024 showed mild OSA and CPAP was recommended but declined due to claustrophobia. She is planning to follow-up in regards to options for a dental appliance.  Signed, Ellsworth Lennox, PA-C

## 2023-06-07 ENCOUNTER — Encounter: Payer: Self-pay | Admitting: Student

## 2023-06-07 ENCOUNTER — Ambulatory Visit: Payer: Medicare HMO | Attending: Student | Admitting: Student

## 2023-06-07 ENCOUNTER — Other Ambulatory Visit: Payer: Self-pay | Admitting: *Deleted

## 2023-06-07 VITALS — BP 110/76 | HR 71 | Ht 66.0 in | Wt 238.0 lb

## 2023-06-07 DIAGNOSIS — E785 Hyperlipidemia, unspecified: Secondary | ICD-10-CM

## 2023-06-07 DIAGNOSIS — Z9889 Other specified postprocedural states: Secondary | ICD-10-CM | POA: Diagnosis not present

## 2023-06-07 DIAGNOSIS — G4730 Sleep apnea, unspecified: Secondary | ICD-10-CM | POA: Diagnosis not present

## 2023-06-07 DIAGNOSIS — Z8679 Personal history of other diseases of the circulatory system: Secondary | ICD-10-CM

## 2023-06-07 DIAGNOSIS — I10 Essential (primary) hypertension: Secondary | ICD-10-CM | POA: Diagnosis not present

## 2023-06-07 DIAGNOSIS — I4819 Other persistent atrial fibrillation: Secondary | ICD-10-CM

## 2023-06-07 DIAGNOSIS — R931 Abnormal findings on diagnostic imaging of heart and coronary circulation: Secondary | ICD-10-CM | POA: Diagnosis not present

## 2023-06-07 DIAGNOSIS — I5032 Chronic diastolic (congestive) heart failure: Secondary | ICD-10-CM

## 2023-06-07 DIAGNOSIS — I4891 Unspecified atrial fibrillation: Secondary | ICD-10-CM

## 2023-06-07 MED ORDER — APIXABAN 5 MG PO TABS: 5.0000 mg | ORAL_TABLET | Freq: Two times a day (BID) | ORAL | 1 refills | Status: DC

## 2023-06-07 NOTE — Telephone Encounter (Signed)
 Prescription refill request for Eliquis received. Indication: AF Last office visit: 06/07/23  B Strader PA-C Scr: 0.79 on 05/10/23  Epic Age: 73 Weight: 108kg  Based on above findings Eliquis 5mg  twice daily is the appropriate dose.  Refill approved.

## 2023-06-07 NOTE — Patient Instructions (Signed)
 Medication Instructions:  Your physician recommends that you continue on your current medications as directed. Please refer to the Current Medication list given to you today.  *If you need a refill on your cardiac medications before your next appointment, please call your pharmacy*   Lab Work: None If you have labs (blood work) drawn today and your tests are completely normal, you will receive your results only by: MyChart Message (if you have MyChart) OR A paper copy in the mail If you have any lab test that is abnormal or we need to change your treatment, we will call you to review the results.   Testing/Procedures: None   Follow-Up: At [facility], you and your health needs are our priority.  As part of our continuing mission to provide you with exceptional heart care, we have created designated Provider Care Teams.  These Care Teams include your primary Cardiologist (physician) and Advanced Practice Providers (APPs -  Physician Assistants and Nurse Practitioners) who all work together to provide you with the care you need, when you need it.  We recommend signing up for the patient portal called "MyChart".  Sign up information is provided on this After Visit Summary.  MyChart is used to connect with patients for Virtual Visits (Telemedicine).  Patients are able to view lab/test results, encounter notes, upcoming appointments, etc.  Non-urgent messages can be sent to your provider as well.   To learn more about what you can do with MyChart, go to [email or web address].    Your next appointment:   6 month(s)  Provider:   You may see Dina Rich, MD or one of the following Advanced Practice Providers on your designated Care Team:   Randall An, PA-C  Jacolyn Reedy, [place]     Other Instructions

## 2023-06-11 ENCOUNTER — Ambulatory Visit (HOSPITAL_COMMUNITY)
Admission: RE | Admit: 2023-06-11 | Discharge: 2023-06-11 | Disposition: A | Discharge status: Home / Self Care | Source: Ambulatory Visit | Attending: Family Medicine | Admitting: Family Medicine

## 2023-06-11 ENCOUNTER — Ambulatory Visit (HOSPITAL_COMMUNITY)
Admission: RE | Admit: 2023-06-11 | Discharge: 2023-06-11 | Disposition: A | Discharge status: Home / Self Care | Source: Ambulatory Visit | Attending: Family Medicine

## 2023-06-11 ENCOUNTER — Encounter (HOSPITAL_COMMUNITY): Payer: Self-pay

## 2023-06-11 DIAGNOSIS — M542 Cervicalgia: Secondary | ICD-10-CM | POA: Diagnosis not present

## 2023-06-11 DIAGNOSIS — M5136 Other intervertebral disc degeneration, lumbar region with discogenic back pain only: Secondary | ICD-10-CM | POA: Diagnosis not present

## 2023-06-11 DIAGNOSIS — M5459 Other low back pain: Secondary | ICD-10-CM

## 2023-06-11 DIAGNOSIS — M47812 Spondylosis without myelopathy or radiculopathy, cervical region: Secondary | ICD-10-CM | POA: Diagnosis not present

## 2023-06-11 DIAGNOSIS — M5137 Other intervertebral disc degeneration, lumbosacral region with discogenic back pain only: Secondary | ICD-10-CM | POA: Diagnosis not present

## 2023-06-11 DIAGNOSIS — M47816 Spondylosis without myelopathy or radiculopathy, lumbar region: Secondary | ICD-10-CM | POA: Diagnosis not present

## 2023-06-11 DIAGNOSIS — M4312 Spondylolisthesis, cervical region: Secondary | ICD-10-CM | POA: Diagnosis not present

## 2023-06-11 DIAGNOSIS — M4802 Spinal stenosis, cervical region: Secondary | ICD-10-CM | POA: Diagnosis not present

## 2023-06-28 ENCOUNTER — Ambulatory Visit (HOSPITAL_COMMUNITY): Payer: Medicare HMO | Admitting: Physician Assistant

## 2023-07-05 ENCOUNTER — Encounter (HOSPITAL_COMMUNITY): Payer: Self-pay | Admitting: Physician Assistant

## 2023-07-05 ENCOUNTER — Ambulatory Visit (HOSPITAL_COMMUNITY)
Admission: RE | Admit: 2023-07-05 | Discharge: 2023-07-05 | Disposition: A | Discharge status: Home / Self Care | Source: Ambulatory Visit | Attending: Physician Assistant | Admitting: Physician Assistant

## 2023-07-05 VITALS — BP 130/70 | HR 78 | Ht 66.0 in | Wt 235.4 lb

## 2023-07-05 DIAGNOSIS — D6869 Other thrombophilia: Secondary | ICD-10-CM | POA: Diagnosis not present

## 2023-07-05 DIAGNOSIS — J449 Chronic obstructive pulmonary disease, unspecified: Secondary | ICD-10-CM | POA: Diagnosis not present

## 2023-07-05 DIAGNOSIS — Z7901 Long term (current) use of anticoagulants: Secondary | ICD-10-CM | POA: Diagnosis not present

## 2023-07-05 DIAGNOSIS — I272 Pulmonary hypertension, unspecified: Secondary | ICD-10-CM | POA: Diagnosis not present

## 2023-07-05 DIAGNOSIS — I2510 Atherosclerotic heart disease of native coronary artery without angina pectoris: Secondary | ICD-10-CM | POA: Diagnosis not present

## 2023-07-05 DIAGNOSIS — I110 Hypertensive heart disease with heart failure: Secondary | ICD-10-CM | POA: Diagnosis not present

## 2023-07-05 DIAGNOSIS — G4733 Obstructive sleep apnea (adult) (pediatric): Secondary | ICD-10-CM | POA: Insufficient documentation

## 2023-07-05 DIAGNOSIS — E669 Obesity, unspecified: Secondary | ICD-10-CM | POA: Insufficient documentation

## 2023-07-05 DIAGNOSIS — Z6837 Body mass index (BMI) 37.0-37.9, adult: Secondary | ICD-10-CM | POA: Insufficient documentation

## 2023-07-05 DIAGNOSIS — I4891 Unspecified atrial fibrillation: Secondary | ICD-10-CM | POA: Insufficient documentation

## 2023-07-05 DIAGNOSIS — I4819 Other persistent atrial fibrillation: Secondary | ICD-10-CM | POA: Diagnosis not present

## 2023-07-05 DIAGNOSIS — Z79899 Other long term (current) drug therapy: Secondary | ICD-10-CM | POA: Diagnosis not present

## 2023-07-05 DIAGNOSIS — I5032 Chronic diastolic (congestive) heart failure: Secondary | ICD-10-CM | POA: Diagnosis not present

## 2023-07-05 NOTE — Progress Notes (Signed)
 Primary Care Physician: Angelia Kelp, NP Primary Cardiologist: Dr Armida Lander Primary Electrophysiologist: Dr Arlester Ladd  Referring Physician: Theotis Flake PA   Kathryn Olsen is a 73 y.o. female with a history of HTN, chronic diastolic CHF, COPD, HTN, pulmonary HTN, atrial flutter, atrial fibrillation who presents for consultation in the [facility].  The patient was initially diagnosed with atrial fibrillation 07/2021 after presenting to the ED from her PCP office with symptoms of chest discomfort. She was rate controlled and underwent outpatient DCCV on 10/21/21. Patient is on Eliquis  for stroke prevention.   She was seen 05/16/22 and found to be back in afib. Patient reports that her Jeraldine Molt mobile alerted her she was back in afib starting 12/2021. She was started on diltiazem  and referred to the AF clinic to discuss options. She was started on amiodarone  as a bridge to ablation and had repeat DCCV on 06/02/22. She is s/p afib ablation with Dr Arlester Ladd 06/15/22. She initially did well but had recurrence of her afib and underwent repeat afib and flutter ablation on 05/31/23.  Patient returns for follow up for atrial fibrillation. She reports that she has done well since the ablation. She denies any interim symptoms of afib. No chest pain or groin issues.   Today, she  denies symptoms of palpitations, chest pain, shortness of breath, orthopnea, PND, lower extremity edema, dizziness, presyncope, syncope, bleeding, or neurologic sequela. The patient is tolerating medications without difficulties and is otherwise without complaint today.    Atrial Fibrillation Risk Factors:  she does have symptoms or diagnosis of sleep apnea. she does not have a history of rheumatic fever.   Atrial Fibrillation Management history:  Previous antiarrhythmic drugs: amiodarone   Previous cardioversions: 10/2021, 06/02/22 Previous ablations: 06/15/22, 05/31/23 Anticoagulation history:  Eliquis    Past Medical History:  Diagnosis Date   Asthma    COPD (chronic obstructive pulmonary disease) (HCC)    questionable   Degeneration of intervertebral disc, site unspecified    Depression    DJD (degenerative joint disease)    Dyslipidemia    Dysrhythmia    GERD (gastroesophageal reflux disease)    Pneumonia 04/14/2013   Shortness of breath    on exertion   Sleep apnea    stopbang=4   Unspecified essential hypertension     Current Outpatient Medications  Medication Sig Dispense Refill   apixaban  (ELIQUIS ) 5 MG TABS tablet Take 1 tablet (5 mg total) by mouth 2 (two) times daily. 180 tablet 1   atorvastatin (LIPITOR) 10 MG tablet Take 10 mg by mouth daily.     chlorthalidone  (HYGROTON ) 25 MG tablet TAKE 1 TABLET(25 MG) BY MOUTH DAILY 90 tablet 3   Cholecalciferol (VITAMIN D ) 50 MCG (2000 UT) tablet Take 2,000 Units by mouth daily.     diltiazem  (CARDIZEM  CD) 180 MG 24 hr capsule Take 1 capsule (180 mg total) by mouth daily. 90 capsule 3   diltiazem  (CARDIZEM ) 60 MG tablet TAKE 1 TABLET(60 MG) BY MOUTH EVERY 8 HOURS AS NEEDED FOR PALPITATIONS 270 tablet 1   furosemide  (LASIX ) 20 MG tablet Take 1 tablet (20 mg total) by mouth daily. As needed pt may take additional 20 MG Lasix  by mouth as needed if directed by Alleviate HF Study x 4 days. Do not remove prescription. 90 tablet 3   lisinopril  (ZESTRIL ) 40 MG tablet TAKE 1 TABLET(40 MG) BY MOUTH DAILY 90 tablet 3   meloxicam (MOBIC) 15 MG tablet Take 15 mg by mouth every other  day.     Multiple Vitamin (MULTIVITAMIN WITH MINERALS) TABS tablet Take 1 tablet by mouth daily.     Multiple Vitamins-Minerals (ZINC PO) Take 1 tablet by mouth daily.     potassium chloride  SA (KLOR-CON  M) 20 MEQ tablet Take 2 tablets (40 mEq total) by mouth daily. 180 tablet 1   No current facility-administered medications for this encounter.    ROS- All systems are reviewed and negative except as per the HPI above.  Physical Exam: Vitals:    07/05/23 1111  BP: 130/70  Pulse: 78  Weight: 106.8 kg  Height: 5\' 6"  (1.676 m)    GEN: Well nourished, well developed in no acute distress CARDIAC: Regular rate and rhythm, no murmurs, rubs, gallops RESPIRATORY:  Clear to auscultation without rales, wheezing or rhonchi  ABDOMEN: Soft, non-tender, non-distended EXTREMITIES:  No edema; No deformity    Wt Readings from Last 3 Encounters:  07/05/23 106.8 kg  06/07/23 108 kg  05/31/23 99.8 kg    EKG today demonstrates  SR Vent. rate 78 BPM PR interval 140 ms QRS duration 90 ms QT/QTcB 382/435 ms   Echo 07/18/22 demonstrated   1. Left ventricular ejection fraction, by estimation, is 70 to 75%. The  left ventricle has hyperdynamic function. The left ventricle has no  regional wall motion abnormalities. There is mild left ventricular  hypertrophy. Left ventricular diastolic parameters were normal.   2. Right ventricular systolic function is normal. The right ventricular  size is mildly enlarged. There is normal pulmonary artery systolic  pressure.   3. Left atrial size was severely dilated.   4. Right atrial size was severely dilated.   5. The mitral valve is normal in structure. Trivial mitral valve  regurgitation.   6. The aortic valve is tricuspid. Aortic valve regurgitation is not  visualized. Aortic valve sclerosis is present, with no evidence of aortic  valve stenosis.   7. The inferior vena cava is normal in size with greater than 50%  respiratory variability, suggesting right atrial pressure of 3 mmHg.   Comparison(s): The left ventricular function is unchanged.    Epic records are reviewed at length today  CHA2DS2-VASc Score = 5  The patient's score is based upon: CHF History: 1 HTN History: 1 Diabetes History: 0 Stroke History: 0 Vascular Disease History: 1 Age Score: 1 Gender Score: 1       ASSESSMENT AND PLAN: Persistent Atrial Fibrillation (ICD10:  I48.19) The patient's CHA2DS2-VASc score is 5,  indicating a 7.2% annual risk of stroke.   S/p afib ablation 06/15/22, repeat afib and flutter ablation 05/31/23 Patient appears to be maintaining SR Kardia mobile for home monitoring Continue diltiazem  180 mg daily with 60 mg PRN for heart racing Continue Eliquis  5 mg BID with no missed doses for 3 months post ablation.   Secondary Hypercoagulable State (ICD10:  D68.69) The patient is at significant risk for stroke/thromboembolism based upon her CHA2DS2-VASc Score of 5.  Continue Apixaban  (Eliquis ). No bleeding issues.   Obesity Body mass index is 37.99 kg/m.  Encouraged lifestyle modification  OSA  The importance of adequate treatment of sleep apnea was discussed today in order to improve our ability to maintain sinus rhythm long term. Patient is having some dental work done and then is planning to follow up with Dr Hortense Lyons office about referral for oral appliance.   HTN Stable on current regimen  Chronic HFpEF EF 70-75% GDMT per primary cardiology team Fluid status appears stable today NYHA class II symptoms  CAD No anginal symptoms Followed by Dr Amanda Jungling   Follow up with Dr Arlester Ladd as scheduled.    Myrtha Ates PA-C Afib Clinic [facility] [address] [place], [place] [address] [phone] 07/05/2023 11:17 AM

## 2023-07-10 ENCOUNTER — Telehealth: Payer: Self-pay

## 2023-07-10 NOTE — Telephone Encounter (Signed)
 Patient called and she says she received a call from [facility] to schedule a colonoscopy and she is returning the call. I called and was told they do not scheduled colonoscopies, possibly short stay at [facility] called her. I called the patient's PCP office and was on hold for extended time. I advised the patient of the above and to call her PCP office to get direction on who to call back. She says ok.  Copied from CRM [id number]. Topic: General - Call Back - No Documentation >> Jul 10, 2023 10:09 AM Kathryn Olsen wrote: Reason for CRM: patient is returning a call in reference to a positive colon guard test and would like a call back

## 2023-07-13 ENCOUNTER — Encounter

## 2023-07-13 VITALS — BP 111/67 | HR 61 | Resp 14

## 2023-07-13 DIAGNOSIS — Z006 Encounter for examination for normal comparison and control in clinical research program: Secondary | ICD-10-CM

## 2023-07-13 NOTE — Research (Signed)
 Alleviate HF Research Study  Study Exit  No adverse events or cardiovascular medication exchanges to report at this time.  Labs drawn 6 MHWT EQ 5D 5L KCCQ NYHA ll  Current Outpatient Medications  Medication Instructions   apixaban  (ELIQUIS ) 5 mg, Oral, 2 times daily   atorvastatin (LIPITOR) 10 mg, Daily   chlorthalidone  (HYGROTON ) 25 MG tablet TAKE 1 TABLET(25 MG) BY MOUTH DAILY   diltiazem  (CARDIZEM  CD) 180 mg, Oral, Daily   diltiazem  (CARDIZEM ) 60 MG tablet TAKE 1 TABLET(60 MG) BY MOUTH EVERY 8 HOURS AS NEEDED FOR PALPITATIONS   furosemide  (LASIX ) 20 MG tablet Take 1 tablet (20 mg total) by mouth daily. As needed pt may take additional 20 MG Lasix  by mouth as needed if directed by Alleviate HF Study x 4 days. Do not remove prescription.   lisinopril  (ZESTRIL ) 40 MG tablet TAKE 1 TABLET(40 MG) BY MOUTH DAILY   meloxicam (MOBIC) 15 mg, Every other day   Multiple Vitamin (MULTIVITAMIN WITH MINERALS) TABS tablet 1 tablet, Daily   Multiple Vitamins-Minerals (ZINC PO) 1 tablet, Daily   potassium chloride  SA (KLOR-CON  M) 20 MEQ tablet 40 mEq, Oral, Daily   Vitamin D  2,000 Units, Daily

## 2023-07-15 LAB — BASIC METABOLIC PANEL WITH GFR
BUN/Creatinine Ratio: 32 — ABNORMAL HIGH (ref 12–28)
BUN: 22 mg/dL (ref 8–27)
CO2: 24 mmol/L (ref 20–29)
Calcium: 9.8 mg/dL (ref 8.7–10.3)
Chloride: 105 mmol/L (ref 96–106)
Creatinine, Ser: 0.68 mg/dL (ref 0.57–1.00)
Glucose: 102 mg/dL — ABNORMAL HIGH (ref 70–99)
Potassium: 3.8 mmol/L (ref 3.5–5.2)
Sodium: 144 mmol/L (ref 134–144)
eGFR: 91 mL/min/{1.73_m2} (ref >59)

## 2023-07-15 LAB — BRAIN NATRIURETIC PEPTIDE: BNP: 107.2 pg/mL — ABNORMAL HIGH (ref 0.0–100.0)

## 2023-07-15 LAB — CBC
Hematocrit: 47 % — ABNORMAL HIGH (ref 34.0–46.6)
Hemoglobin: 15.7 g/dL (ref 11.1–15.9)
MCH: 30.2 pg (ref 26.6–33.0)
MCHC: 33.4 g/dL (ref 31.5–35.7)
MCV: 90 fL (ref 79–97)
Platelets: 261 10*3/uL (ref 150–450)
RBC: 5.2 x10E6/uL (ref 3.77–5.28)
RDW: 12.9 % (ref 11.7–15.4)
WBC: 6.8 10*3/uL (ref 3.4–10.8)

## 2023-07-19 ENCOUNTER — Ambulatory Visit: Admitting: Podiatry

## 2023-07-24 ENCOUNTER — Other Ambulatory Visit: Payer: Self-pay

## 2023-07-24 MED ORDER — POTASSIUM CHLORIDE CRYS ER 20 MEQ PO TBCR: 40.0000 meq | EXTENDED_RELEASE_TABLET | Freq: Every day | ORAL | 1 refills | Status: DC

## 2023-08-18 ENCOUNTER — Ambulatory Visit: Payer: Medicare HMO | Admitting: Cardiovascular Disease

## 2023-09-17 NOTE — Progress Notes (Unsigned)
 Ellouise Console, PA-C [address] [place], [place]  [address] Phone: [phone]   Gastroenterology Consultation  Referring Provider:     Joshua Santana CROME, NP Primary Care Physician:  Joshua Santana CROME, NP Primary Gastroenterologist:  Ellouise Console, PA-C / Glendia Holt, MD  Reason for Consultation:     Positive Cologuard, schedule colonoscopy        HPI:   Kathryn Olsen is a 73 y.o. y/o female referred for consultation & management  by Joshua Santana CROME, NP.  Previous patient of Dr. Debrah.  Referred to evaluate positive Cologuard (05/20/2023) and discuss scheduling a colonoscopy.  No previous Colonoscopy.  No family history of colon cancer.  Recent labs 07/13/2023 showed normal hemoglobin 15.7.  No anemia.  04/2011 EGD by Dr. Debrah: 9 cm hiatal hernia, otherwise normal.  Patient denies any GI symptoms such as rectal bleeding, abdominal pain, weight loss, heartburn or dysphagia.  She underwent Hiatal Hernia repair 4 years ago by Dr. Gladis and has not had any heartburn since then.  PMH: Hypertension, atrial fibrillation, PSVT, pulmonary hypertension, asthma, mild sleep apnea, GERD, history of hiatal hernia repair.  Currently on Eliquis  (Rx by DR. Mealor).  Underwent ablation for A-fib 05/31/2023.  Has not had any episodes of Afib since then.  Last echo 07/2022 normal LVEF 70 to 75%.  Past Medical History:  Diagnosis Date   Arthritis    Asthma    Atrial fibrillation (HCC)    COPD (chronic obstructive pulmonary disease) (HCC)    questionable   Degeneration of intervertebral disc, site unspecified    Depression    DJD (degenerative joint disease)    Dyslipidemia    Dysrhythmia    GERD (gastroesophageal reflux disease)    Hypertension    Pneumonia 04/14/2013   Shortness of breath    on exertion   Sleep apnea    stopbang=4   Unspecified essential hypertension     Past Surgical History:  Procedure Laterality Date   APPENDECTOMY  1976   ATRIAL FIBRILLATION ABLATION  N/A 06/15/2022   Procedure: ATRIAL FIBRILLATION ABLATION;  Surgeon: Nancey Eulas BRAVO, MD;  Location: [facility];  Service: Cardiovascular;  Laterality: N/A;   ATRIAL FIBRILLATION ABLATION N/A 05/31/2023   Procedure: ATRIAL FIBRILLATION ABLATION;  Surgeon: Nancey Eulas BRAVO, MD;  Location: [facility];  Service: Cardiovascular;  Laterality: N/A;   CARDIOVERSION N/A 10/21/2021   Procedure: CARDIOVERSION;  Surgeon: Alvan Dorn FALCON, MD;  Location: [facility];  Service: Endoscopy;  Laterality: N/A;   CARDIOVERSION N/A 06/02/2022   Procedure: CARDIOVERSION;  Surgeon: Loni Soyla LABOR, MD;  Location: [facility];  Service: Cardiovascular;  Laterality: N/A;   ECTOPIC PREGNANCY SURGERY     HERNIA REPAIR     KNEE ARTHROSCOPY  03/14/2008   Right knee   KNEE ARTHROSCOPY  03/14/2010   Left knee   LOOP RECORDER IMPLANT  08/18/2022   Reveal LINQ II Cardiac Monitor   TOTAL KNEE ARTHROPLASTY  11/22/2011   Procedure: TOTAL KNEE ARTHROPLASTY;  Surgeon: Lynwood SHAUNNA Bern, MD;  Location: [facility];  Service: Orthopedics;  Laterality: Left;   TOTAL KNEE REVISION Left 09/23/2013   Procedure: REVISION LEFT TOTAL KNEE WITH POLY EXCHANGE UPSIZE AND PATELLA REVISION;  Surgeon: Donnice JONETTA Car, MD;  Location: [facility];  Service: Orthopedics;  Laterality: Left;   UPPER GI ENDOSCOPY N/A 04/13/2021   Procedure: UPPER GI ENDOSCOPY;  Surgeon: Gladis Donnice, MD;  Location: [facility];  Service: General;  Laterality:  N/A;   XI ROBOTIC ASSISTED HIATAL HERNIA REPAIR N/A 04/13/2021   Procedure: ROBOTIC REPAIR OF LARGE TYPE III HIATAL HERNIA WITH NISSEN, AND GASTROPEXY;  Surgeon: Gladis Cough, MD;  Location: [facility];  Service: General;  Laterality: N/A;    Prior to Admission medications   Medication Sig Start Date End Date Taking? Authorizing Provider  apixaban  (ELIQUIS ) 5 MG TABS tablet Take 1 tablet (5 mg total) by mouth 2 (two) times daily. 06/07/23   Alvan Dorn FALCON, MD  atorvastatin (LIPITOR) 10 MG tablet  Take 10 mg by mouth daily. 11/28/17   [provider]  chlorthalidone  (HYGROTON ) 25 MG tablet TAKE 1 TABLET(25 MG) BY MOUTH DAILY 04/17/23   Alvan Dorn FALCON, MD  Cholecalciferol (VITAMIN D ) 50 MCG (2000 UT) tablet Take 2,000 Units by mouth daily.    [provider]  diltiazem  (CARDIZEM  CD) 180 MG 24 hr capsule Take 1 capsule (180 mg total) by mouth daily. 04/14/23 07/13/23  Alvan Dorn FALCON, MD  diltiazem  (CARDIZEM ) 60 MG tablet TAKE 1 TABLET(60 MG) BY MOUTH EVERY 8 HOURS AS NEEDED FOR PALPITATIONS 01/11/23   Alvan Dorn FALCON, MD  furosemide  (LASIX ) 20 MG tablet Take 1 tablet (20 mg total) by mouth daily. As needed pt may take additional 20 MG Lasix  by mouth as needed if directed by Alleviate HF Study x 4 days. Do not remove prescription. 08/18/22   Camnitz, Soyla Gladis, MD  lisinopril  (ZESTRIL ) 40 MG tablet TAKE 1 TABLET(40 MG) BY MOUTH DAILY 04/17/23   Alvan Dorn FALCON, MD  meloxicam (MOBIC) 15 MG tablet Take 15 mg by mouth every other day.    [provider]  Multiple Vitamin (MULTIVITAMIN WITH MINERALS) TABS tablet Take 1 tablet by mouth daily.    [provider]  Multiple Vitamins-Minerals (ZINC PO) Take 1 tablet by mouth daily.    [provider]  potassium chloride  SA (KLOR-CON  M) 20 MEQ tablet Take 2 tablets (40 mEq total) by mouth daily. 07/24/23   Alvan Dorn FALCON, MD    Family History  Problem Relation Age of Onset   COPD Mother    Hypertension Mother    Lung cancer Father    Other Sister        MGUS   Melanoma Brother    Alcohol abuse Brother    Colon cancer Neg Hx    Esophageal cancer Neg Hx    Stomach cancer Neg Hx      Social History   Tobacco Use   Smoking status: Never    Passive exposure: Yes   Smokeless tobacco: Never   Tobacco comments:    both parents smoked  Vaping Use   Vaping status: Never Used  Substance Use Topics   Alcohol use: Yes    Alcohol/week: 1.0 standard drink of alcohol    Types: 1 Glasses of wine  per week    Comment: occasional glass of wine   Drug use: No    Allergies as of 09/19/2023 - Review Complete 09/19/2023  Allergen Reaction Noted   Ace inhibitors Diarrhea 08/03/2015    Review of Systems:    All systems reviewed and negative except where noted in HPI.   Physical Exam:  BP 110/62   Pulse 67   Ht 5' 6 (1.676 m)   Wt 223 lb (101.2 kg)   SpO2 96%   BMI 35.99 kg/m  No LMP recorded. Patient is postmenopausal.  General:   Alert,  Well-developed, well-nourished, pleasant and cooperative in NAD Lungs:  Respirations even and unlabored.  Clear throughout to auscultation.   No wheezes, crackles, or rhonchi. No acute distress. Heart:  Regular rate and rhythm; no murmurs, clicks, rubs, or gallops. Abdomen:  Normal bowel sounds.  No bruits.  Soft, and non-distended without masses, hepatosplenomegaly or hernias noted.  No Tenderness.  No guarding or rebound tenderness.    Neurologic:  Alert and oriented x3;  grossly normal neurologically. Psych:  Alert and cooperative. Normal mood and affect.  Imaging Studies: No results found.  Labs: CBC    Component Value Date/Time   WBC 6.8 07/13/2023 1015   WBC 7.7 06/08/2022 1546   RBC 5.20 07/13/2023 1015   RBC 5.57 (H) 06/08/2022 1546   HGB 15.7 07/13/2023 1015   HCT 47.0 (H) 07/13/2023 1015   PLT 261 07/13/2023 1015   MCV 90 07/13/2023 1015   MCH 30.2 07/13/2023 1015   MCH 30.3 06/08/2022 1546   MCHC 33.4 07/13/2023 1015   MCHC 32.8 06/08/2022 1546   RDW 12.9 07/13/2023 1015   LYMPHSABS 1.8 06/08/2022 1546   MONOABS 0.6 06/08/2022 1546   EOSABS 0.2 06/08/2022 1546   BASOSABS 0.1 06/08/2022 1546    CMP     Component Value Date/Time   NA 144 07/13/2023 1015   K 3.8 07/13/2023 1015   CL 105 07/13/2023 1015   CO2 24 07/13/2023 1015   GLUCOSE 102 (H) 07/13/2023 1015   GLUCOSE 100 (H) 06/08/2022 1546   BUN 22 07/13/2023 1015   CREATININE 0.68 07/13/2023 1015   CALCIUM  9.8 07/13/2023 1015   PROT 6.6 08/18/2022  1305   ALBUMIN 4.3 08/18/2022 1305   AST 23 08/18/2022 1305   ALT 21 08/18/2022 1305   ALKPHOS 97 08/18/2022 1305   BILITOT 0.4 08/18/2022 1305   GFRNONAA 59 (L) 06/08/2022 1546   GFRAA >60 10/25/2014 1920    Assessment and Plan:   Kathryn Olsen is a 73 y.o. y/o female has been referred for:  1.  Positive Cologuard;  She has No GI symptoms.  No anemia.  Never had a colonoscopy. - Scheduling Colonoscopy I discussed risks of colonoscopy with patient to include risk of bleeding, colon perforation, and risk of sedation.  Patient expressed understanding and agrees to proceed with colonoscopy.   2.  Comorbidities: Hypertension, atrial fibrillation, PSVT, pulmonary hypertension, asthma, mild sleep apnea, GERD, history of hiatal hernia repair.  Currently on Eliquis .  Underwent ablation for A-fib 05/31/2023.  Currently in NSR.  Last echo 07/2022 normal LVEF 70 to 75%. - Request cardiac clearance and permission to hold Eliquis  2 days prior to colonoscopy.  Follow up Based on Colonoscopy results.  Ellouise Console, PA-C

## 2023-09-18 ENCOUNTER — Ambulatory Visit: Attending: Cardiovascular Disease | Admitting: Cardiovascular Disease

## 2023-09-18 ENCOUNTER — Encounter: Payer: Self-pay | Admitting: Cardiovascular Disease

## 2023-09-18 VITALS — BP 120/76 | HR 74 | Ht 66.0 in | Wt 221.5 lb

## 2023-09-18 DIAGNOSIS — I471 Supraventricular tachycardia, unspecified: Secondary | ICD-10-CM | POA: Diagnosis not present

## 2023-09-18 DIAGNOSIS — I4821 Permanent atrial fibrillation: Secondary | ICD-10-CM

## 2023-09-18 NOTE — Patient Instructions (Addendum)

## 2023-09-18 NOTE — Progress Notes (Signed)
 Electrophysiology Office Note:    Date:  09/18/2023   ID:  Kathryn Olsen, DOB [birth year], MRN [id number]  PCP:  Joshua Santana CROME, NP   [facility] Providers Cardiologist:  Alvan Carrier, MD Electrophysiologist:  Eulas FORBES Furbish, MD     Referring MD: Joshua Santana CROME, NP   History of Present Illness:    Kathryn Olsen is a 73 y.o. female with a hx listed below, significant for persistent atrial fibrillation, CHFpEF, pulmonary hypertension, COPD, hypertension referred for arrhythmia management.  She was diagnosed with atrial fibrillation in May 2023 after presenting to the ER with chest discomfort.  DC cardioversion was performed in August 2023.  In follow-up in March 2024, she was noted to have had recurrence of atrial fibrillation which, according to her cardia mobile device have been persistent since October 2023.  She was started on amiodarone  and underwent DC cardioversion on March 6.  She She feels much better after the cardioversion.  Her shortness of breath and fatigue are significantly improved.    She underwent atrial fibrillation ablation in April 2024.  The ablation was uncomplicated.  She has noticed recurrence of fast and irregular heart rhythms intermittently over the past several months.  She was seen in the hospital for fluid this past weekend and noted to be in atrial fibrillation with rapid ventricular rates.  I reviewed the patient's CT and labs. There was no LAA thrombus. she  has not missed any doses of anticoagulation, and she took her dose last night. There have been no changes in the patient's diagnoses, medications, or condition since our recent clinic visit. She did have a routine screening cologard that was positive. She does not have a history of colon cancer.    EKGs/Labs/Other Studies Reviewed Today:    Echocardiogram:  TTE - 12/04/2020 EF 60-65%. Mildly dilated LA   Monitors:   Stress testing:    Advanced  imaging:   EKG:  Last EKG results: today - sinus rhythm, IRBBB, LAFB   Recent Labs: 07/13/2023: BNP 107.2; BUN 22; Creatinine, Ser 0.68; Hemoglobin 15.7; Platelets 261; Potassium 3.8; Sodium 144     Physical Exam:    VS:  BP 120/76 (BP Location: Left Arm, Patient Position: Sitting, Cuff Size: Large)   Pulse 74   Ht 5' 6 (1.676 m)   Wt 221 lb 8 oz (100.5 kg)   SpO2 98%   BMI 35.75 kg/m     Wt Readings from Last 3 Encounters:  09/18/23 221 lb 8 oz (100.5 kg)  07/05/23 235 lb 6.4 oz (106.8 kg)  06/07/23 238 lb (108 kg)     GEN:  Well nourished, well developed in no acute distress CARDIAC: RRR, no murmurs, rubs, gallops RESPIRATORY:  Normal work of breathing MUSCULOSKELETAL: no edema    ASSESSMENT & PLAN:    Persistent atrial fibrillation Symptomatic.  Status post pulmonary vein isolation April 2024 With recurrence of atrial fibrillation She underwent ablation for atrial fibrillation on May 31, 2023 She is maintaining sinus rhythm since her ablation  Atrial flutter Loop recorder strips suggest atrial flutter Status post ablation of CTI, posterior left atrium, and mitral isthmus line  Hypercoagulable state due to atrial fibrillation Continue apixaban  5 mg Labs May 2025 reviewed          Medication Adjustments/Labs and Tests Ordered: Current medicines are reviewed at length with the patient today.  Concerns regarding medicines are outlined above.  Orders Placed This Encounter  Procedures   EKG 12-Lead   No  orders of the defined types were placed in this encounter.    Signed, Eulas FORBES Furbish, MD  09/18/2023 10:28 AM    [facility]

## 2023-09-19 ENCOUNTER — Ambulatory Visit: Admitting: Physician Assistant

## 2023-09-19 ENCOUNTER — Encounter: Payer: Self-pay | Admitting: Physician Assistant

## 2023-09-19 ENCOUNTER — Telehealth: Payer: Self-pay

## 2023-09-19 VITALS — BP 110/62 | HR 67 | Ht 66.0 in | Wt 223.0 lb

## 2023-09-19 DIAGNOSIS — R195 Other fecal abnormalities: Secondary | ICD-10-CM

## 2023-09-19 DIAGNOSIS — Z7901 Long term (current) use of anticoagulants: Secondary | ICD-10-CM

## 2023-09-19 MED ORDER — NA SULFATE-K SULFATE-MG SULF 17.5-3.13-1.6 GM/177ML PO SOLN: 1.0000 | Freq: Once | ORAL | 0 refills | Status: AC

## 2023-09-19 NOTE — Patient Instructions (Signed)
 You have been scheduled for a Colonoscopy. Please follow written instructions given to you at your visit today.   If you use inhalers (even only as needed), please bring them with you on the day of your procedure.  DO NOT TAKE 7 DAYS PRIOR TO TEST- Trulicity (dulaglutide) Ozempic, Wegovy (semaglutide) Mounjaro (tirzepatide) Bydureon Bcise (exanatide extended release)  DO NOT TAKE 1 DAY PRIOR TO YOUR TEST Rybelsus (semaglutide) Adlyxin (lixisenatide) Victoza (liraglutide) Byetta (exanatide) ___________________________________________________________________________  Please follow up sooner if symptoms increase or worsen   Due to recent changes in healthcare laws, you may see the results of your imaging and laboratory studies on MyChart before your provider has had a chance to review them.  We understand that in some cases there may be results that are confusing or concerning to you. Not all laboratory results come back in the same time frame and the provider may be waiting for multiple results in order to interpret others.  Please give us  48 hours in order for your provider to thoroughly review all the results before contacting the office for clarification of your results.   Thank you for trusting me with your gastrointestinal care!   Ellouise Console, PA-C _______________________________________________________  If your blood pressure at your visit was 140/90 or greater, please contact your primary care physician to follow up on this.  _______________________________________________________  If you are age 98 or older, your body mass index should be between 23-30. Your Body mass index is 35.99 kg/m. If this is out of the aforementioned range listed, please consider follow up with your Primary Care Provider.  If you are age 38 or younger, your body mass index should be between 19-25. Your Body mass index is 35.99 kg/m. If this is out of the aformentioned range listed, please consider  follow up with your Primary Care Provider.   ________________________________________________________  The [facility] providers would like to encourage you to use MYCHART to communicate with providers for non-urgent requests or questions.  Due to long hold times on the telephone, sending your provider a message by [facility] may be a faster and more efficient way to get a response.  Please allow 48 business hours for a response.  Please remember that this is for non-urgent requests.  _______________________________________________________

## 2023-09-19 NOTE — Telephone Encounter (Signed)
 [facility] Pre-operative Risk Assessment     Request for surgical clearance:     Endoscopy Procedure  What type of surgery is being performed?     Colonoscopy  When is this surgery scheduled?     10/17/23  What type of clearance is required ?   Pharmacy  Are there any medications that need to be held prior to surgery and how long? Eliquis  2 days  Practice name and name of physician performing surgery?      [facility]  What is your office phone and fax number?      Phone- [phone]  Fax- [phone]  Anesthesia type (None, local, MAC, general) ?       MAC   Please route your response to Alethea Blocker, CMA

## 2023-09-20 NOTE — Telephone Encounter (Signed)
 Pharmacy please advise on holding Eliquis  prior to colonoscopy scheduled for 10/17/2023. Thank you.

## 2023-09-25 ENCOUNTER — Telehealth: Payer: Self-pay

## 2023-09-25 NOTE — Telephone Encounter (Deleted)
 [facility] Pre-operative Risk Assessment     Request for surgical clearance:     Endoscopy Procedure  What type of surgery is being performed?     Colonoscopy  When is this surgery scheduled?     12/08/23  What type of clearance is required ?   Pharmacy  Are there any medications that need to be held prior to surgery and how long? Xarelto 2 days  Practice name and name of physician performing surgery?      [facility]  What is your office phone and fax number?      Phone- [phone]  Fax- [phone]  Anesthesia type (None, local, MAC, general) ?       MAC   Please route your response to Alethea Blocker, CMA

## 2023-09-25 NOTE — Telephone Encounter (Signed)
 Na

## 2023-09-25 NOTE — Telephone Encounter (Signed)
 Duplicate documentation, already has active entry phone note 09/19/23. Will remove from preop box.

## 2023-09-26 DIAGNOSIS — M1990 Unspecified osteoarthritis, unspecified site: Secondary | ICD-10-CM | POA: Insufficient documentation

## 2023-09-26 NOTE — Progress Notes (Signed)
 Agree with the assessment and plan as outlined by Brigitte Canard, PA-C.

## 2023-09-26 NOTE — Telephone Encounter (Signed)
 Patient with diagnosis of A Fib on Eliquis  for anticoagulation.  Ablation on 05/31/23  Procedure: colonoscopy Date of procedure: 10/17/23   CHA2DS2-VASc Score = 6  This indicates a 9.7% annual risk of stroke. The patient's score is based upon: CHF History: 1 HTN History: 1 Diabetes History: 0 Stroke History: 0 Vascular Disease History: 1 Age Score: 2 Gender Score: 1    CrCl 114 ml/min Platelet count 261K   Per office protocol, patient can hold Eliquis  for 2 days prior to procedure.    **This guidance is not considered finalized until pre-operative APP has relayed final recommendations.**

## 2023-09-27 NOTE — Telephone Encounter (Signed)
   Patient Name: Kathryn Olsen  DOB: [birth year] MRN: [id number]  Primary Cardiologist: Alvan Carrier, MD  Clinical pharmacists have reviewed the patient's past medical history, labs, and current medications as part of preoperative protocol coverage. The following recommendations have been made:   Patient with diagnosis of A Fib on Eliquis  for anticoagulation.  Ablation on 05/31/23   Procedure: colonoscopy Date of procedure: 10/17/23     CHA2DS2-VASc Score = 6  This indicates a 9.7% annual risk of stroke. The patient's score is based upon: CHF History: 1 HTN History: 1 Diabetes History: 0 Stroke History: 0 Vascular Disease History: 1 Age Score: 2 Gender Score: 1     CrCl 114 ml/min Platelet count 261K     Per office protocol, patient can hold Eliquis  for 2 days prior to procedure.     I will route this recommendation to the requesting party via Epic fax function and remove from pre-op  pool.  Please call with questions.  Lamarr Satterfield, NP 09/27/2023, 7:37 AM

## 2023-10-10 ENCOUNTER — Other Ambulatory Visit: Payer: Self-pay | Admitting: Medical Genetics

## 2023-10-11 ENCOUNTER — Other Ambulatory Visit (HOSPITAL_COMMUNITY)
Admission: RE | Admit: 2023-10-11 | Discharge: 2023-10-11 | Disposition: A | Discharge status: Home / Self Care | Payer: Self-pay | Source: Ambulatory Visit | Attending: Medical Genetics | Admitting: Medical Genetics

## 2023-10-17 ENCOUNTER — Encounter: Payer: Self-pay | Admitting: Gastroenterology

## 2023-10-17 ENCOUNTER — Ambulatory Visit (AMBULATORY_SURGERY_CENTER): Admitting: Gastroenterology

## 2023-10-17 VITALS — BP 121/65 | HR 64 | Temp 97.4°F | Resp 16 | Ht 66.0 in | Wt 223.0 lb

## 2023-10-17 DIAGNOSIS — K5730 Diverticulosis of large intestine without perforation or abscess without bleeding: Secondary | ICD-10-CM

## 2023-10-17 DIAGNOSIS — Z1211 Encounter for screening for malignant neoplasm of colon: Secondary | ICD-10-CM

## 2023-10-17 DIAGNOSIS — R195 Other fecal abnormalities: Secondary | ICD-10-CM | POA: Diagnosis not present

## 2023-10-17 DIAGNOSIS — D122 Benign neoplasm of ascending colon: Secondary | ICD-10-CM

## 2023-10-17 DIAGNOSIS — K635 Polyp of colon: Secondary | ICD-10-CM

## 2023-10-17 MED ORDER — SODIUM CHLORIDE 0.9 % IV SOLN: 500.0000 mL | Freq: Once | INTRAVENOUS | Status: DC

## 2023-10-17 NOTE — Progress Notes (Signed)
 [facility] History and Physical   Primary Care Physician:  Joshua Santana CROME, NP   Reason for Procedure:   Positive Cologuard  Plan:    Colonoscopy     HPI: Kathryn Olsen is a 73 y.o. female undergoing initial colonoscopy following a positive Cologuard test.  She has no family history of colon cancer and no chronic GI symptoms.  She takes Eliquis  for a-fib, last dose 8/2.   Past Medical History:  Diagnosis Date   Arthritis    Asthma    Atrial fibrillation (HCC)    COPD (chronic obstructive pulmonary disease) (HCC)    questionable   Degeneration of intervertebral disc, site unspecified    Depression    DJD (degenerative joint disease)    Dyslipidemia    Dysrhythmia    GERD (gastroesophageal reflux disease)    Hypertension    Pneumonia 04/14/2013   Shortness of breath    on exertion   Sleep apnea    stopbang=4   Unspecified essential hypertension     Past Surgical History:  Procedure Laterality Date   APPENDECTOMY  1976   ATRIAL FIBRILLATION ABLATION N/A 06/15/2022   Procedure: ATRIAL FIBRILLATION ABLATION;  Surgeon: Nancey Eulas BRAVO, MD;  Location: [facility];  Service: Cardiovascular;  Laterality: N/A;   ATRIAL FIBRILLATION ABLATION N/A 05/31/2023   Procedure: ATRIAL FIBRILLATION ABLATION;  Surgeon: Nancey Eulas BRAVO, MD;  Location: [facility];  Service: Cardiovascular;  Laterality: N/A;   CARDIOVERSION N/A 10/21/2021   Procedure: CARDIOVERSION;  Surgeon: Alvan Dorn FALCON, MD;  Location: [facility];  Service: Endoscopy;  Laterality: N/A;   CARDIOVERSION N/A 06/02/2022   Procedure: CARDIOVERSION;  Surgeon: Loni Soyla LABOR, MD;  Location: [facility] ENDOSCOPY;  Service: Cardiovascular;  Laterality: N/A;   ECTOPIC PREGNANCY SURGERY     HERNIA REPAIR     KNEE ARTHROSCOPY  03/14/2008   Right knee   KNEE ARTHROSCOPY  03/14/2010   Left knee   LOOP RECORDER IMPLANT  08/18/2022   Reveal LINQ II Cardiac Monitor   TOTAL KNEE ARTHROPLASTY   11/22/2011   Procedure: TOTAL KNEE ARTHROPLASTY;  Surgeon: Lynwood SHAUNNA Bern, MD;  Location: [facility];  Service: Orthopedics;  Laterality: Left;   TOTAL KNEE REVISION Left 09/23/2013   Procedure: REVISION LEFT TOTAL KNEE WITH POLY EXCHANGE UPSIZE AND PATELLA REVISION;  Surgeon: Donnice JONETTA Car, MD;  Location: [facility];  Service: Orthopedics;  Laterality: Left;   UPPER GI ENDOSCOPY N/A 04/13/2021   Procedure: UPPER GI ENDOSCOPY;  Surgeon: Gladis Donnice, MD;  Location: [facility] ORS;  Service: General;  Laterality: N/A;   XI ROBOTIC ASSISTED HIATAL HERNIA REPAIR N/A 04/13/2021   Procedure: 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 Authorizing Provider  atorvastatin (LIPITOR) 10 MG tablet Take 10 mg by mouth daily. 11/28/17  Yes [provider]  chlorthalidone  (HYGROTON ) 25 MG tablet TAKE 1 TABLET(25 MG) BY MOUTH DAILY 04/17/23  Yes Branch, Dorn FALCON, MD  Cholecalciferol (VITAMIN D ) 50 MCG (2000 UT) tablet Take 2,000 Units by mouth daily.   Yes [provider]  diltiazem  (CARDIZEM ) 60 MG tablet TAKE 1 TABLET(60 MG) BY MOUTH EVERY 8 HOURS AS NEEDED FOR PALPITATIONS 01/11/23  Yes Branch, Dorn FALCON, MD  furosemide  (LASIX ) 20 MG tablet Take 1 tablet (20 mg total) by mouth  daily. As needed pt may take additional 20 MG Lasix  by mouth as needed if directed by Alleviate HF Study x 4 days. Do not remove prescription. 08/18/22  Yes Camnitz, Will Gladis, MD  lisinopril  (ZESTRIL ) 40 MG tablet TAKE 1 TABLET(40 MG) BY MOUTH DAILY 04/17/23  Yes Branch, Dorn FALCON, MD  meloxicam (MOBIC) 15 MG tablet Take 15 mg by mouth every other day.   Yes [provider]  Multiple Vitamin (MULTIVITAMIN WITH MINERALS) TABS tablet Take 1 tablet by mouth daily.   Yes [provider]  Multiple Vitamins-Minerals  (ZINC PO) Take 1 tablet by mouth daily.   Yes [provider]  potassium chloride  SA (KLOR-CON  M) 20 MEQ tablet Take 2 tablets (40 mEq total) by mouth daily. 07/24/23  Yes Branch, Dorn FALCON, MD  apixaban  (ELIQUIS ) 5 MG TABS tablet Take 1 tablet (5 mg total) by mouth 2 (two) times daily. 06/07/23   Alvan Dorn FALCON, MD  diltiazem  (CARDIZEM  CD) 180 MG 24 hr capsule Take 1 capsule (180 mg total) by mouth daily. 04/14/23 09/19/23  Alvan Dorn FALCON, MD    Current Outpatient Medications  Medication Sig Dispense Refill   atorvastatin (LIPITOR) 10 MG tablet Take 10 mg by mouth daily.     chlorthalidone  (HYGROTON ) 25 MG tablet TAKE 1 TABLET(25 MG) BY MOUTH DAILY 90 tablet 3   Cholecalciferol (VITAMIN D ) 50 MCG (2000 UT) tablet Take 2,000 Units by mouth daily.     diltiazem  (CARDIZEM ) 60 MG tablet TAKE 1 TABLET(60 MG) BY MOUTH EVERY 8 HOURS AS NEEDED FOR PALPITATIONS 270 tablet 1   furosemide  (LASIX ) 20 MG tablet Take 1 tablet (20 mg total) by mouth daily. As needed pt may take additional 20 MG Lasix  by mouth as needed if directed by Alleviate HF Study x 4 days. Do not remove prescription. 90 tablet 3   lisinopril  (ZESTRIL ) 40 MG tablet TAKE 1 TABLET(40 MG) BY MOUTH DAILY 90 tablet 3   meloxicam (MOBIC) 15 MG tablet Take 15 mg by mouth every other day.     Multiple Vitamin (MULTIVITAMIN WITH MINERALS) TABS tablet Take 1 tablet by mouth daily.     Multiple Vitamins-Minerals (ZINC PO) Take 1 tablet by mouth daily.     potassium chloride  SA (KLOR-CON  M) 20 MEQ tablet Take 2 tablets (40 mEq total) by mouth daily. 180 tablet 1   apixaban  (ELIQUIS ) 5 MG TABS tablet Take 1 tablet (5 mg total) by mouth 2 (two) times daily. 180 tablet 1   diltiazem  (CARDIZEM  CD) 180 MG 24 hr capsule Take 1 capsule (180 mg total) by mouth daily. 90 capsule 3   Current Facility-Administered Medications  Medication Dose Route Frequency Provider Last Rate Last Admin   0.9 %  sodium chloride  infusion  500 mL Intravenous Once  Stacia Glendia BRAVO, MD        Allergies as of 10/17/2023 - Review Complete 10/17/2023  Allergen Reaction Noted   Ace inhibitors Diarrhea 08/03/2015    Family History  Problem Relation Age of Onset   COPD Mother    Hypertension Mother    Lung cancer Father    Other Sister        MGUS   Melanoma Brother    Alcohol abuse Brother    Colon cancer Neg Hx    Esophageal cancer Neg Hx    Stomach cancer Neg Hx     Social History   Socioeconomic History   Marital status: Married    Spouse name: Not on file  Number of children: 2   Years of education: Not on file   Highest education level: Not on file  Occupational History   Occupation: Nurse    Employer: [employer]  Tobacco Use   Smoking status: Never    Passive exposure: Yes   Smokeless tobacco: Never   Tobacco comments:    both parents smoked  Vaping Use   Vaping status: Never Used  Substance and Sexual Activity   Alcohol use: Yes    Alcohol/week: 1.0 standard drink of alcohol    Types: 1 Glasses of wine per week    Comment: occasional glass of wine   Drug use: No   Sexual activity: Not Currently    Birth control/protection: Post-menopausal  Other Topics Concern   Not on file  Social History Narrative   Not on file   Social Drivers of Health   Financial Resource Strain: Low Risk  (02/12/2021)   Overall Financial Resource Strain (CARDIA)    Difficulty of Paying Living Expenses: Not hard at all  Food Insecurity: Low Risk  (11/10/2022)   Received from [facility]   Hunger Vital Sign    Within the past 12 months, you worried that your food would run out before you got money to buy more: Never true    Within the past 12 months, the food you bought just didn't last and you didn't have money to get more. : Never true  Transportation Needs: No Transportation Needs (11/10/2022)   Received from [facility]    In the past 12 months, has lack of reliable transportation kept you from medical  appointments, meetings, work or from getting things needed for daily living? : No  Physical Activity: Insufficiently Active (02/12/2021)   Exercise Vital Sign    Days of Exercise per Week: 5 days    Minutes of Exercise per Session: 20 min  Stress: Stress Concern Present (02/12/2021)   [employer] of Occupational Health - Occupational Stress Questionnaire    Feeling of Stress : To some extent  Social Connections: Moderately Integrated (02/12/2021)   Social Connection and Isolation Panel    Frequency of Communication with Friends and Family: More than three times a week    Frequency of Social Gatherings with Friends and Family: Three times a week    Attends Religious Services: Never    Active Member of Clubs or Organizations: Yes    Attends Banker Meetings: More than 4 times per year    Marital Status: Married  Catering manager Violence: Not At Risk (02/12/2021)   Humiliation, Afraid, Rape, and Kick questionnaire    Fear of Current or Ex-Partner: No    Emotionally Abused: No    Physically Abused: No    Sexually Abused: No    Review of Systems:  All other review of systems negative except as mentioned in the HPI.  Physical Exam: Vital signs BP 111/85   Pulse 79   Temp (!) 97.4 F (36.3 C) (Temporal)   Ht 5' 6 (1.676 m)   Wt 223 lb (101.2 kg)   SpO2 95%   BMI 35.99 kg/m   General:   Alert,  Well-developed, well-nourished, pleasant and cooperative in NAD Airway:  Mallampati 2 Lungs:  Clear throughout to auscultation.   Heart:  Regular rate and rhythm; no murmurs, clicks, rubs,  or gallops. Abdomen:  Soft, nontender and nondistended. Normal bowel sounds.   Neuro/Psych:  Normal mood and affect. A and O x 3  Jalessa Peyser E. Stacia, MD [facility]

## 2023-10-17 NOTE — Patient Instructions (Addendum)
-  Handout on polyps and diverticulosis -Await pathology results -Resume your Eliquis  (apixaban ) at prior dose tomorrow  YOU HAD AN ENDOSCOPIC PROCEDURE TODAY AT THE [facility]:   Refer to the procedure report that was given to you for any specific questions about what was found during the examination.  If the procedure report does not answer your questions, please call your gastroenterologist to clarify.  If you requested that your care partner not be given the details of your procedure findings, then the procedure report has been included in a sealed envelope for you to review at your convenience later.  YOU SHOULD EXPECT: Some feelings of bloating in the abdomen. Passage of more gas than usual.  Walking can help get rid of the air that was put into your GI tract during the procedure and reduce the bloating. If you had a lower endoscopy (such as a colonoscopy or flexible sigmoidoscopy) you may notice spotting of blood in your stool or on the toilet paper. If you underwent a bowel prep for your procedure, you may not have a normal bowel movement for a few days.  Please Note:  You might notice some irritation and congestion in your nose or some drainage.  This is from the oxygen used during your procedure.  There is no need for concern and it should clear up in a day or so.  SYMPTOMS TO REPORT IMMEDIATELY:  Following lower endoscopy (colonoscopy or flexible sigmoidoscopy):  Excessive amounts of blood in the stool  Significant tenderness or worsening of abdominal pains  Swelling of the abdomen that is new, acute  Fever of 100F or higher  For urgent or emergent issues, a gastroenterologist can be reached at any hour by calling [phone]. Do not use MyChart messaging for urgent concerns.    DIET:  We do recommend a small meal at first, but then you may proceed to your regular diet.  Drink plenty of fluids but you should avoid alcoholic beverages for 24 hours.  ACTIVITY:  You  should plan to take it easy for the rest of today and you should NOT DRIVE or use heavy machinery until tomorrow (because of the sedation medicines used during the test).    FOLLOW UP: Our staff will call the number listed on your records the next business day following your procedure.  We will call around 7:15- 8:00 am to check on you and address any questions or concerns that you may have regarding the information given to you following your procedure. If we do not reach you, we will leave a message.     If any biopsies were taken you will be contacted by phone or by letter within the next 1-3 weeks.  Please call us  at [phone] if you have not heard about the biopsies in 3 weeks.    SIGNATURES/CONFIDENTIALITY: You and/or your care partner have signed paperwork which will be entered into your electronic medical record.  These signatures attest to the fact that that the information above on your After Visit Summary has been reviewed and is understood.  Full responsibility of the confidentiality of this discharge information lies with you and/or your care-partner.

## 2023-10-17 NOTE — Progress Notes (Signed)
 Called to room to assist during endoscopic procedure.  Patient ID and intended procedure confirmed with present staff. Received instructions for my participation in the procedure from the performing physician.

## 2023-10-17 NOTE — Op Note (Signed)
 [facility] Patient Name: Kathryn Olsen Procedure Date: 10/17/2023 7:47 AM MRN: [id number] Endoscopist: Glendia E. Stacia , MD, [id number] Age: 73 Referring MD:  Date of Birth: 09-19-1950 Gender: Female Account #: [id number] Procedure:                Colonoscopy Indications:              This is the patient's first colonoscopy, Positive                            Cologuard test Medicines:                Monitored Anesthesia Care Procedure:                Pre-Anesthesia Assessment:                           - Prior to the procedure, a History and Physical                            was performed, and patient medications and                            allergies were reviewed. The patient's tolerance of                            previous anesthesia was also reviewed. The risks                            and benefits of the procedure and the sedation                            options and risks were discussed with the patient.                            All questions were answered, and informed consent                            was obtained. Prior Anticoagulants: The patient has                            taken Eliquis  (apixaban ), last dose was 3 days                            prior to procedure. ASA Grade Assessment: III - A                            patient with severe systemic disease. After                            reviewing the risks and benefits, the patient was                            deemed in satisfactory condition to undergo the  procedure.                           After obtaining informed consent, the colonoscope                            was passed under direct vision. Throughout the                            procedure, the patient's blood pressure, pulse, and                            oxygen saturations were monitored continuously. The                            Olympus Scope SN: [id number] was introduced through                             the anus and advanced to the the terminal ileum,                            with identification of the appendiceal orifice and                            IC valve. The colonoscopy was somewhat difficult                            due to significant looping. Successful completion                            of the procedure was aided by using manual                            pressure. The patient tolerated the procedure well.                            The quality of the bowel preparation was good. The                            terminal ileum, ileocecal valve, appendiceal                            orifice, and rectum were photographed. The bowel                            preparation used was SUPREP via split dose                            instruction. Scope In: 8:11:16 AM Scope Out: 8:31:14 AM Scope Withdrawal Time: 0 hours 12 minutes 33 seconds  Total Procedure Duration: 0 hours 19 minutes 58 seconds  Findings:                 The perianal and digital rectal examinations were  normal. Pertinent negatives include normal                            sphincter tone.                           Three sessile polyps were found in the ascending                            colon. The polyps were 2 to 4 mm in size. These                            polyps were removed with a cold snare. Resection                            and retrieval were complete. Estimated blood loss                            was minimal.                           Multiple large-mouthed and small-mouthed                            diverticula were found in the sigmoid colon,                            descending colon and ascending colon.                           The exam was otherwise normal throughout the                            examined colon.                           The terminal ileum appeared normal.                           The retroflexed view of the distal rectum  and anal                            verge was normal and showed no anal or rectal                            abnormalities. Complications:            No immediate complications. Estimated Blood Loss:     Estimated blood loss was minimal. Impression:               - Three 2 to 4 mm polyps in the ascending colon,                            removed with a cold snare. Resected and retrieved.                           -  Moderate diverticulosis in the sigmoid colon, in                            the descending colon and in the ascending colon.                           - The examined portion of the ileum was normal.                           - The distal rectum and anal verge are normal on                            retroflexion view. Recommendation:           - Patient has a contact number available for                            emergencies. The signs and symptoms of potential                            delayed complications were discussed with the                            patient. Return to normal activities tomorrow.                            Written discharge instructions were provided to the                            patient.                           - Resume previous diet.                           - Continue present medications.                           - Await pathology results.                           - Given patient's age and lack of high risk polyps,                            I would advise against further colon cancer                            screening                           - Resume Eliquis  (apixaban ) at prior dose tomorrow. Nyheim Seufert E. Stacia, MD 10/17/2023 8:39:25 AM This report has been signed electronically.

## 2023-10-18 ENCOUNTER — Telehealth: Payer: Self-pay

## 2023-10-18 NOTE — Telephone Encounter (Signed)
  Follow up Call-     10/17/2023    7:33 AM  Call back number  Post procedure Call Back phone  # [phone]  Permission to leave phone message Yes     Patient questions:  Do you have a fever, pain , or abdominal swelling? No. Pain Score  0 *  Have you tolerated food without any problems? Yes.    Have you been able to return to your normal activities? Yes.    Do you have any questions about your discharge instructions: Diet   No. Medications  No. Follow up visit  No.  Do you have questions or concerns about your Care? No.  Actions: * If pain score is 4 or above: No action needed, pain <4.

## 2023-10-20 LAB — SURGICAL PATHOLOGY

## 2023-10-21 LAB — GENECONNECT MOLECULAR SCREEN: Genetic Analysis Overall Interpretation: NEGATIVE

## 2023-10-24 ENCOUNTER — Ambulatory Visit: Payer: Self-pay | Admitting: Gastroenterology

## 2023-10-24 NOTE — Progress Notes (Signed)
 Kathryn Olsen,  Good news: the polyps that I removed during your recent examination were NOT precancerous.  Based on your age and lack of high risk polyps, I recommend against any further colon cancer screening.

## 2023-11-17 ENCOUNTER — Ambulatory Visit: Admitting: Podiatry

## 2023-11-21 ENCOUNTER — Encounter: Payer: Self-pay | Admitting: Podiatry

## 2023-11-21 ENCOUNTER — Ambulatory Visit (INDEPENDENT_AMBULATORY_CARE_PROVIDER_SITE_OTHER)

## 2023-11-21 ENCOUNTER — Ambulatory Visit: Admitting: Podiatry

## 2023-11-21 VITALS — Ht 66.0 in | Wt 223.0 lb

## 2023-11-21 DIAGNOSIS — L600 Ingrowing nail: Secondary | ICD-10-CM | POA: Diagnosis not present

## 2023-11-21 DIAGNOSIS — M7752 Other enthesopathy of left foot: Secondary | ICD-10-CM | POA: Diagnosis not present

## 2023-11-21 DIAGNOSIS — M2042 Other hammer toe(s) (acquired), left foot: Secondary | ICD-10-CM | POA: Diagnosis not present

## 2023-11-21 MED ORDER — DEXAMETHASONE SODIUM PHOSPHATE 120 MG/30ML IJ SOLN
2.0000 mg | Freq: Once | INTRAMUSCULAR | Status: AC
  Administered 2023-11-21: 2 mg

## 2023-11-21 NOTE — Progress Notes (Unsigned)
 Patient presents for complaint of pain at the distal aspect of the third toe left has been bothering her for several weeks now.  Does not recall any injury to it.  Painful at the distal tip.  Has been coming and increasing problem over the past several years.  Also complains of pain around the great toe and the plantar medial aspect of the first metatarsal phalangeal joint left.  He gets pain with walking and wearing shoes.  Also complains of pain along the nail borders on the hallux nail left.  Has had them dug out periodically.   Physical exam:  General appearance: Pleasant, and in no acute distress. AOx3.  Vascular: Pedal pulses: DP 2/4 bilaterally, PT 2/4 bilaterally.  Mild edema lower legs bilaterally. Capillary fill time immediate bilaterally patient presents with complaint of pain at the distal aspect of the third toe of the left.  Is been bothering her for several weeks now and getting worse.  Does not recall any injury to the toe.SABRA  Neurological: Light touch intact feet bilaterally.  Normal Achilles reflex bilaterally.  No clonus or spasticity noted.   Dermatologic:   Thick dystrophic nail hallux right with incurvated borders and hypertrophy of the nail fold with redness.  Tenderness along the nail borders.  Skin normal temperature bilaterally.  Skin normal color, tone, and texture bilaterally.   Musculoskeletal: Tenderness distal aspect third toe left.  Severe hammertoe deformities 2 through 5 left.  With loading of the forefoot the third toe sits considerable belittle of the rest of the toes plantarly.  Hallux abductovalgus deformity with limitation of range of motion at the first metatarsal phalangeal joint left.  Radiographs: 3 views left foot: Severe hammertoe deformities 2 through 5.  Joint space narrowing 1st and 3rd metatarsal phalangeal joint with uneven joint surfaces and osteophytic changes.  No notes any fractures or dislocations.  Notes osteomyelitis.  No Insa bone  tumors.  Diagnosis: 1.  Ingrown nail hallux left foot. 2.  Bursitis third toe left. 3.  Hammertoes 2 through 5 left  Plan: -New patient office visit for evaluation and management level 3.  Modifier 25. - Discussed with patient the ingrown nail on the hallux left.  I discussed with her surgical options with matrixectomy of the nail borders.  I think this to be a good idea for her to relieve pain around the toenail.  She can schedule this at her convenience. -Discussed painful hammertoe and resulting bursitis on the left third toe.  Will try an injection today and try some padding and see how she does with this.  If it continues to be a problem she may require a capsulotomy of the DIPJ of the third toe left -injected 1.0cc mixture of 0.5cc 0.5% Marcaine  and  0.5cc Dexamethasone  sodium phosphate  USP at first distal toe third left. -Dispensed silicone toe sleeve third toe left -Dispensed OTC orthotics bilaterally  Return 2 weeks follow-up injection third toe left

## 2023-11-25 ENCOUNTER — Ambulatory Visit (INDEPENDENT_AMBULATORY_CARE_PROVIDER_SITE_OTHER)

## 2023-11-25 DIAGNOSIS — I4819 Other persistent atrial fibrillation: Secondary | ICD-10-CM

## 2023-11-29 ENCOUNTER — Ambulatory Visit: Admitting: Cardiology

## 2023-12-09 ENCOUNTER — Other Ambulatory Visit: Payer: Self-pay | Admitting: Cardiology

## 2023-12-09 DIAGNOSIS — I4891 Unspecified atrial fibrillation: Secondary | ICD-10-CM

## 2023-12-11 LAB — CUP PACEART REMOTE DEVICE CHECK
Date Time Interrogation Session: 20250913162518
Implantable Pulse Generator Implant Date: 20240606

## 2023-12-11 NOTE — Telephone Encounter (Signed)
 Prescription refill request for Eliquis  received. Indication: AF Last office visit: 09/18/23  A Mealor MD Scr: 0.68 on 07/13/23  Epic Age: 73 Weight: 100.5kg  Based on above findings Eliquis  5mg  twice daily is the appropriate dose.  Refill approved.

## 2023-12-12 ENCOUNTER — Ambulatory Visit: Payer: Self-pay | Admitting: Cardiology

## 2023-12-12 NOTE — Progress Notes (Signed)
 Remote Loop Recorder Transmission

## 2023-12-26 ENCOUNTER — Encounter

## 2023-12-26 ENCOUNTER — Ambulatory Visit

## 2023-12-26 DIAGNOSIS — I4891 Unspecified atrial fibrillation: Secondary | ICD-10-CM

## 2023-12-27 LAB — CUP PACEART REMOTE DEVICE CHECK
Date Time Interrogation Session: 20251014073434
Implantable Pulse Generator Implant Date: 20240606

## 2023-12-29 NOTE — Progress Notes (Signed)
 Remote Loop Recorder Transmission

## 2024-01-05 ENCOUNTER — Other Ambulatory Visit: Payer: Self-pay | Admitting: Cardiology

## 2024-01-09 ENCOUNTER — Ambulatory Visit: Payer: Self-pay | Admitting: Cardiology

## 2024-01-26 ENCOUNTER — Ambulatory Visit

## 2024-01-26 DIAGNOSIS — I4819 Other persistent atrial fibrillation: Secondary | ICD-10-CM

## 2024-01-28 LAB — CUP PACEART REMOTE DEVICE CHECK
Date Time Interrogation Session: 20251114063429
Implantable Pulse Generator Implant Date: 20240606

## 2024-01-29 NOTE — Progress Notes (Signed)
 Remote Loop Recorder Transmission

## 2024-01-31 ENCOUNTER — Ambulatory Visit: Payer: Self-pay | Admitting: Cardiology

## 2024-02-26 ENCOUNTER — Ambulatory Visit

## 2024-02-26 DIAGNOSIS — I4819 Other persistent atrial fibrillation: Secondary | ICD-10-CM

## 2024-02-27 LAB — CUP PACEART REMOTE DEVICE CHECK
Date Time Interrogation Session: 20251215063647
Implantable Pulse Generator Implant Date: 20240606

## 2024-03-01 ENCOUNTER — Ambulatory Visit: Admitting: Cardiology

## 2024-03-01 NOTE — Progress Notes (Signed)
 Remote Loop Recorder Transmission

## 2024-03-11 ENCOUNTER — Ambulatory Visit: Payer: Self-pay | Admitting: Cardiology

## 2024-03-13 ENCOUNTER — Telehealth (HOSPITAL_BASED_OUTPATIENT_CLINIC_OR_DEPARTMENT_OTHER): Payer: Self-pay

## 2024-03-13 NOTE — Telephone Encounter (Signed)
"  ° °  Patient Name: Kathryn Olsen  DOB: 20-May-1950 MRN: [id number]  Primary Cardiologist: Alvan Carrier, MD  Chart reviewed as part of pre-operative protocol coverage.   IF SIMPLE EXTRACTION/CLEANINGS: Simple dental extractions (i.e. 1-2 teeth) are considered low risk procedures per guidelines and generally do not require any specific cardiac clearance. It is also generally accepted that for simple extractions and dental cleanings, there is no need to interrupt blood thinner therapy.   SBE prophylaxis is not required for the patient from a cardiac standpoint.  I will route this recommendation to the requesting party via Epic fax function and remove from pre-op  pool.  Please call with questions.  Lamarr Satterfield, NP 03/13/2024, 1:46 PM  "

## 2024-03-13 NOTE — Telephone Encounter (Signed)
"  ° °  Pre-operative Risk Assessment    Patient Name: Kathryn Olsen  DOB: 08-Sep-1950 MRN: [id number]   Date of last office visit: 09/18/2023 with Dr. Nancey  Date of next office visit: 04/17/2024 with Dr. Alvan  Request for Surgical Clearance    Procedure:  Dental Extraction - Amount of Teeth to be Pulled:  One extraction on tooth #11, possibly surgical.  Date of Surgery:  Clearance TBD                                 Surgeon:  Rosalynn Ling, DDS Surgeon's Group or Practice Name:  [facility]  Phone number:  [phone] Fax number:  [phone]   Type of Clearance Requested:   - Medical  - Pharmacy:  Hold Apixaban  (Eliquis ) -needs instructions   Type of Anesthesia:  Local    Additional requests/questions:  None  SignedPatrcia Iverson CROME   03/13/2024, 1:37 PM   "

## 2024-03-19 DIAGNOSIS — I4891 Unspecified atrial fibrillation: Secondary | ICD-10-CM

## 2024-03-19 MED ORDER — APIXABAN 5 MG PO TABS: 5.0000 mg | ORAL_TABLET | Freq: Two times a day (BID) | ORAL | 0 refills | Status: AC

## 2024-03-19 NOTE — Telephone Encounter (Signed)
" °*  STAT* If patient is at the pharmacy, call can be transferred to refill team.   1. Which medications need to be refilled? (please list name of each medication and dose if known) ELIQUIS  5 MG TABS tablet    2. Would you like to learn more about the convenience, safety, & potential cost savings by using the [facility]? No    3. Are you open to using the [facility] (Type [facility]    4. Which pharmacy/location (including street and city if local pharmacy) is medication to be sent to?[id number] - [place], [place] - [address]    5. Do they need a 30 day or 90 day supply? 90 day   Pt is out of medication  "

## 2024-03-19 NOTE — Telephone Encounter (Signed)
 Pt's medication was resent to pt's pharmacy a requested confirmation received.

## 2024-03-28 ENCOUNTER — Ambulatory Visit

## 2024-03-28 DIAGNOSIS — I4891 Unspecified atrial fibrillation: Secondary | ICD-10-CM

## 2024-03-28 LAB — CUP PACEART REMOTE DEVICE CHECK
Date Time Interrogation Session: 20260115063609
Implantable Pulse Generator Implant Date: 20240606

## 2024-03-29 ENCOUNTER — Ambulatory Visit: Payer: Self-pay | Admitting: Cardiology

## 2024-04-05 NOTE — Progress Notes (Signed)
 Remote Loop Recorder Transmission

## 2024-04-17 ENCOUNTER — Encounter: Payer: Self-pay | Admitting: *Deleted

## 2024-04-17 ENCOUNTER — Encounter: Payer: Self-pay | Admitting: Cardiology

## 2024-04-17 ENCOUNTER — Ambulatory Visit: Admitting: Cardiology

## 2024-04-17 VITALS — BP 126/82 | HR 66 | Ht 65.0 in | Wt 203.8 lb

## 2024-04-17 DIAGNOSIS — I10 Essential (primary) hypertension: Secondary | ICD-10-CM

## 2024-04-17 DIAGNOSIS — I4891 Unspecified atrial fibrillation: Secondary | ICD-10-CM

## 2024-04-17 DIAGNOSIS — D6869 Other thrombophilia: Secondary | ICD-10-CM | POA: Diagnosis not present

## 2024-04-17 DIAGNOSIS — I5032 Chronic diastolic (congestive) heart failure: Secondary | ICD-10-CM

## 2024-04-17 NOTE — Patient Instructions (Signed)
 Medication Instructions:  Continue all current medications.   Labwork: none  Testing/Procedures: none  Follow-Up: 6 months   Any Other Special Instructions Will Be Listed Below (If Applicable).   If you need a refill on your cardiac medications before your next appointment, please call your pharmacy.

## 2024-04-17 NOTE — Progress Notes (Signed)
 "     Clinical Summary Kathryn Olsen is a 74 y.o.female seen today for follow up of the following medical problems.   Prevsiously followed by [facility] Dr Elmira      1.PSVT - noted on prior monitor - from [facility] notes has not been started on scehduled av nodal agents due to 3.1 sec asymptomatic pause -- no recent symptoms     2. Afib - new diagnosis during 07/2021 ER visit     -10/2021 DCCV to NSR - recurrent afib  - followed in afib clinic, started on amiodrone. Avoided class IC due to bifascicular block on EKG, she did not want to be admitted to start dofetildie.  - 06/02/22 DCCV to SR - 06/15/22 had afib ablation with Dr Nancey - recurrence of afib after ablation, repeat ablation 05/2023.  - has loop recorder. Jan 2026 normal device check  - no recent palpitations - no bleeding on eliquis           3. HFpEF 11/2020 echo LVEF 60-65%, indet diastolic, mild RVE normal with mod pulm HTN.  - 07/18/22 echo: LVEF 70-75%, no WMAs, normal diastolic fxn,normal RV, severe BAE  -5/1 afib clinic appt increased LE edema, was to increase lasix  to 40mg  daily x 3 days   -no recent edema.  - she is on lasix , taking lasix  20mg  daily.        4. HTN  - compliant with meds   5. Pulmonary HTN - followed by pulmonary 11/2020 echo PASP 47 - 07/2022 echo normal PASP at 32     6. 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 Weight loss - 40 lbs loss over 6 months. Has cut back on sweets   Has a house outside of [facility], she is from [place] . Often drives down there for some time in the fall.  Past Medical History:  Diagnosis Date   Arthritis    Asthma    Atrial fibrillation (HCC)    COPD (chronic obstructive pulmonary disease) (HCC)    questionable   Degeneration of intervertebral disc, site unspecified    Depression    DJD (degenerative joint disease)    Dyslipidemia    Dysrhythmia    GERD (gastroesophageal  reflux disease)    Hypertension    Pneumonia 04/14/2013   Shortness of breath    on exertion   Sleep apnea    stopbang=4   Unspecified essential hypertension      Allergies[1]   Current Outpatient Medications  Medication Sig Dispense Refill   apixaban  (ELIQUIS ) 5 MG TABS tablet Take 1 tablet (5 mg total) by mouth 2 (two) times daily. 180 tablet 0   atorvastatin (LIPITOR) 10 MG tablet Take 10 mg by mouth daily.     chlorthalidone  (HYGROTON ) 25 MG tablet TAKE 1 TABLET(25 MG) BY MOUTH DAILY 90 tablet 3   Cholecalciferol (VITAMIN D ) 50 MCG (2000 UT) tablet Take 2,000 Units by mouth daily.     diltiazem  (CARDIZEM  CD) 180 MG 24 hr capsule Take 1 capsule (180 mg total) by mouth daily. 90 capsule 3   diltiazem  (CARDIZEM ) 60 MG tablet TAKE 1 TABLET(60 MG) BY MOUTH EVERY 8 HOURS AS NEEDED FOR PALPITATIONS 270 tablet 1   furosemide  (LASIX ) 20 MG tablet Take 1 tablet (20 mg total) by mouth daily. As needed pt may take additional 20 MG Lasix  by mouth as needed if directed by Alleviate HF Study x 4 days. Do  not remove prescription. 90 tablet 3   lisinopril  (ZESTRIL ) 40 MG tablet TAKE 1 TABLET(40 MG) BY MOUTH DAILY 90 tablet 3   meloxicam (MOBIC) 15 MG tablet Take 15 mg by mouth every other day.     Multiple Vitamin (MULTIVITAMIN WITH MINERALS) TABS tablet Take 1 tablet by mouth daily.     Multiple Vitamins-Minerals (ZINC PO) Take 1 tablet by mouth daily.     potassium chloride  SA (KLOR-CON  M) 20 MEQ tablet TAKE 2 TABLETS(40 MEQ) BY MOUTH DAILY 180 tablet 1   No current facility-administered medications for this visit.     Past Surgical History:  Procedure Laterality Date   APPENDECTOMY  1976   ATRIAL FIBRILLATION ABLATION N/A 06/15/2022   Procedure: ATRIAL FIBRILLATION ABLATION;  Surgeon: Nancey Eulas BRAVO, MD;  Location: [facility];  Service: Cardiovascular;  Laterality: N/A;   ATRIAL FIBRILLATION ABLATION N/A 05/31/2023   Procedure: ATRIAL FIBRILLATION ABLATION;  Surgeon: Nancey Eulas BRAVO, MD;  Location: [facility] CV [facility];  Service: Cardiovascular;  Laterality: N/A;   CARDIOVERSION N/A 10/21/2021   Procedure: CARDIOVERSION;  Surgeon: Alvan Dorn FALCON, MD;  Location: [facility];  Service: Endoscopy;  Laterality: N/A;   CARDIOVERSION N/A 06/02/2022   Procedure: CARDIOVERSION;  Surgeon: Loni Soyla LABOR, MD;  Location: [facility] ENDOSCOPY;  Service: Cardiovascular;  Laterality: N/A;   ECTOPIC PREGNANCY SURGERY     HERNIA REPAIR     KNEE ARTHROSCOPY  03/14/2008   Right knee   KNEE ARTHROSCOPY  03/14/2010   Left knee   LOOP RECORDER IMPLANT  08/18/2022   Reveal LINQ II Cardiac Monitor   TOTAL KNEE ARTHROPLASTY  11/22/2011   Procedure: TOTAL KNEE ARTHROPLASTY;  Surgeon: Lynwood SHAUNNA Bern, MD;  Location: [facility];  Service: Orthopedics;  Laterality: Left;   TOTAL KNEE REVISION Left 09/23/2013   Procedure: REVISION LEFT TOTAL KNEE WITH POLY EXCHANGE UPSIZE AND PATELLA REVISION;  Surgeon: Donnice JONETTA Car, MD;  Location: [facility];  Service: Orthopedics;  Laterality: Left;   UPPER GI ENDOSCOPY N/A 04/13/2021   Procedure: UPPER GI ENDOSCOPY;  Surgeon: Gladis Donnice, MD;  Location: [facility];  Service: General;  Laterality: N/A;   XI ROBOTIC ASSISTED HIATAL HERNIA REPAIR N/A 04/13/2021   Procedure: 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 She has been exposed to tobacco smoke. She has never used smokeless tobacco. Ms. Gervase reports current alcohol use of about 1.0 standard drink of alcohol per  week.    Physical Examination Today's Vitals   04/17/24 1023  BP: 126/82  Pulse: 66  SpO2: 98%  Weight: 203 lb 12.8 oz (92.4 kg)  Height: 5' 5 (1.651 m)   Body mass index is 33.91 kg/m.  Gen: resting comfortably, no acute distress HEENT: no scleral icterus, pupils equal round and reactive, no palptable cervical adenopathy,  CV: RRR, no m/r,g no jvd Resp: Clear  to auscultation bilaterally GI: abdomen is soft, non-tender, non-distended, normal bowel sounds, no hepatosplenomegaly MSK: extremities are warm, no edema.  Skin: warm, no rash Neuro:  no focal deficits Psych: appropriate affect   Diagnostic Studies  Long term monitor 11 days 11/28/2019 - 12/09/2019: Dominant rhythm: Sinus. HR 35-222 bpm. Avg HR 83 bpm. 595 episodes of SVT, fastest at 147 bpm for 4 min 3 sec, longest for 30 min 51 sec at 125 bpm. 6.3% SVE burden. 1 episode of ventricular tachycardia for 6 beats at 184 bpm <1% VE burden. 1 episode of asymptomatic 3.1 sec sinus pause at 2:01 PM. No atrial fibrillation/atrial flutter/high grade AV block, 1 patient triggered event, correlates with isolated SVE.    Echocardiogram 11/07/2019:  Left ventricle cavity is normal in size. Moderate concentric hypertrophy  of the left ventricle. Normal global wall motion. Normal LV systolic  function with EF 55%. Indeterminate diastolic filling pattern.  Left atrial cavity is severely dilated.  Right atrial cavity is mildly dilated.  Trileaflet aortic valve.  Trace aortic regurgitation.  Mild (Grade I) mitral regurgitation.  Moderate tricuspid regurgitation. Estimated pulmonary artery systolic  pressure 38 mmHg.    EKG 10/14/2019: Sinus rhythm 81 bpm  Borderline left atrial enlargement Incomplete right bundle Dawon Troop block Occasional PAC       Assessment and Plan   1.Afib/acquired thrombophilia - no recent symptoms, recent loop checks have not shown any significant arrhythmias, followed by ep - continue current  meds including eliquis  for stroke prevention   2. HFpEF - euvolemic, continue lasix  at current dosing.    3.HTN -bp is at goal, continue current meds   4. Elevated coronary calcium  score -she will continue statin, no ASA since on anticoag   F/u 6 months     Dorn PHEBE Ross, M.D.     [1]  Allergies Allergen Reactions   Ace Inhibitors Diarrhea  [2]  Allergies Allergen Reactions   Ace Inhibitors Diarrhea   "

## 2024-04-28 ENCOUNTER — Ambulatory Visit

## 2024-05-29 ENCOUNTER — Ambulatory Visit

## 2024-06-29 ENCOUNTER — Ambulatory Visit

## 2024-07-30 ENCOUNTER — Ambulatory Visit

## 2024-08-30 ENCOUNTER — Ambulatory Visit

## 2024-09-30 ENCOUNTER — Ambulatory Visit
